# Patient Record
Sex: Male | Born: 1995 | Race: Black or African American | Hispanic: No | Marital: Single | Smoking: Never smoker
Health system: Southern US, Community
[De-identification: ages and names within clinical notes are randomized; demographics above are authoritative.]

## PROBLEM LIST (undated history)

## (undated) DIAGNOSIS — Z9641 Presence of insulin pump (external) (internal): Secondary | ICD-10-CM

## (undated) DIAGNOSIS — E1110 Type 2 diabetes mellitus with ketoacidosis without coma: Secondary | ICD-10-CM

## (undated) DIAGNOSIS — E1010 Type 1 diabetes mellitus with ketoacidosis without coma: Secondary | ICD-10-CM

## (undated) DIAGNOSIS — E119 Type 2 diabetes mellitus without complications: Secondary | ICD-10-CM

---
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
## 2017-05-29 ENCOUNTER — Encounter (HOSPITAL_COMMUNITY): Payer: Self-pay | Admitting: Emergency Medicine

## 2017-05-29 ENCOUNTER — Observation Stay (HOSPITAL_COMMUNITY)
Admission: EM | Admit: 2017-05-29 | Discharge: 2017-05-30 | Disposition: A | Discharge status: Home / Self Care | Payer: BC Managed Care – PPO | Attending: Internal Medicine | Admitting: Internal Medicine

## 2017-05-29 ENCOUNTER — Other Ambulatory Visit: Payer: Self-pay

## 2017-05-29 DIAGNOSIS — E860 Dehydration: Secondary | ICD-10-CM

## 2017-05-29 DIAGNOSIS — Z794 Long term (current) use of insulin: Secondary | ICD-10-CM | POA: Insufficient documentation

## 2017-05-29 DIAGNOSIS — E1010 Type 1 diabetes mellitus with ketoacidosis without coma: Secondary | ICD-10-CM

## 2017-05-29 DIAGNOSIS — R112 Nausea with vomiting, unspecified: Secondary | ICD-10-CM | POA: Diagnosis present

## 2017-05-29 DIAGNOSIS — E1110 Type 2 diabetes mellitus with ketoacidosis without coma: Secondary | ICD-10-CM | POA: Diagnosis present

## 2017-05-29 DIAGNOSIS — Z9641 Presence of insulin pump (external) (internal): Secondary | ICD-10-CM | POA: Insufficient documentation

## 2017-05-29 DIAGNOSIS — D72829 Elevated white blood cell count, unspecified: Secondary | ICD-10-CM | POA: Diagnosis not present

## 2017-05-29 DIAGNOSIS — N179 Acute kidney failure, unspecified: Secondary | ICD-10-CM | POA: Insufficient documentation

## 2017-05-29 LAB — BASIC METABOLIC PANEL
ANION GAP: 10 (ref 5–15)
BUN: 21 mg/dL — AB (ref 6–20)
CO2: 19 mmol/L — AB (ref 22–32)
Calcium: 8.9 mg/dL (ref 8.9–10.3)
Chloride: 110 mmol/L (ref 101–111)
Creatinine, Ser: 0.9 mg/dL (ref 0.61–1.24)
GFR calc Af Amer: >60 mL/min (ref >60)
GFR calc non Af Amer: >60 mL/min (ref >60)
GLUCOSE: 252 mg/dL — AB (ref 65–99)
POTASSIUM: 4.2 mmol/L (ref 3.5–5.1)
Sodium: 139 mmol/L (ref 135–145)

## 2017-05-29 LAB — COMPREHENSIVE METABOLIC PANEL
ALK PHOS: 107 U/L (ref 38–126)
ALT: 36 U/L (ref 17–63)
ANION GAP: 18 — AB (ref 5–15)
AST: 40 U/L (ref 15–41)
Albumin: 4.7 g/dL (ref 3.5–5.0)
BILIRUBIN TOTAL: 1.5 mg/dL — AB (ref 0.3–1.2)
BUN: 27 mg/dL — ABNORMAL HIGH (ref 6–20)
CALCIUM: 10.3 mg/dL (ref 8.9–10.3)
CO2: 18 mmol/L — ABNORMAL LOW (ref 22–32)
Chloride: 101 mmol/L (ref 101–111)
Creatinine, Ser: 1.33 mg/dL — ABNORMAL HIGH (ref 0.61–1.24)
GFR calc Af Amer: >60 mL/min (ref >60)
Glucose, Bld: 393 mg/dL — ABNORMAL HIGH (ref 65–99)
POTASSIUM: 4.8 mmol/L (ref 3.5–5.1)
Sodium: 137 mmol/L (ref 135–145)
TOTAL PROTEIN: 9.3 g/dL — AB (ref 6.5–8.1)

## 2017-05-29 LAB — CBC
HEMATOCRIT: 46.9 % (ref 39.0–52.0)
HEMATOCRIT: 39.3 % (ref 39.0–52.0)
HEMOGLOBIN: 14.2 g/dL (ref 13.0–17.0)
Hemoglobin: 16.8 g/dL (ref 13.0–17.0)
MCH: 30.5 pg (ref 26.0–34.0)
MCH: 30.5 pg (ref 26.0–34.0)
MCHC: 35.8 g/dL (ref 30.0–36.0)
MCHC: 36.1 g/dL — AB (ref 30.0–36.0)
MCV: 85.1 fL (ref 78.0–100.0)
MCV: 84.3 fL (ref 78.0–100.0)
PLATELETS: 359 10*3/uL (ref 150–400)
Platelets: 313 10*3/uL (ref 150–400)
RBC: 5.51 MIL/uL (ref 4.22–5.81)
RBC: 4.66 MIL/uL (ref 4.22–5.81)
RDW: 13.1 % (ref 11.5–15.5)
RDW: 12.9 % (ref 11.5–15.5)
WBC: 26.4 10*3/uL — AB (ref 4.0–10.5)
WBC: 25.2 10*3/uL — ABNORMAL HIGH (ref 4.0–10.5)

## 2017-05-29 LAB — URINALYSIS, ROUTINE W REFLEX MICROSCOPIC
BACTERIA UA: NONE SEEN
BILIRUBIN URINE: NEGATIVE
HGB URINE DIPSTICK: NEGATIVE
Ketones, ur: 80 mg/dL — AB
LEUKOCYTES UA: NEGATIVE
NITRITE: NEGATIVE
PH: 6 (ref 5.0–8.0)
Protein, ur: NEGATIVE mg/dL
SPECIFIC GRAVITY, URINE: 1.033 — AB (ref 1.005–1.030)
Squamous Epithelial / LPF: NONE SEEN

## 2017-05-29 LAB — CBG MONITORING, ED
Glucose-Capillary: 394 mg/dL — ABNORMAL HIGH (ref 65–99)
Glucose-Capillary: 245 mg/dL — ABNORMAL HIGH (ref 65–99)
Glucose-Capillary: 224 mg/dL — ABNORMAL HIGH (ref 65–99)

## 2017-05-29 LAB — GLUCOSE, CAPILLARY
GLUCOSE-CAPILLARY: 188 mg/dL — AB (ref 65–99)
Glucose-Capillary: 225 mg/dL — ABNORMAL HIGH (ref 65–99)

## 2017-05-29 LAB — LIPASE, BLOOD: Lipase: 31 U/L (ref 11–51)

## 2017-05-29 MED ORDER — SODIUM CHLORIDE 0.9 % IV BOLUS (SEPSIS)
1000.0000 mL | Freq: Once | INTRAVENOUS | Status: AC
  Administered 2017-05-29: 1000 mL via INTRAVENOUS

## 2017-05-29 MED ORDER — SODIUM CHLORIDE 0.9 % IV SOLN
INTRAVENOUS | Status: DC
  Administered 2017-05-29: 12:00:00 via INTRAVENOUS

## 2017-05-29 MED ORDER — POTASSIUM CHLORIDE 10 MEQ/100ML IV SOLN
10.0000 meq | INTRAVENOUS | Status: AC
  Administered 2017-05-29 (×2): 10 meq via INTRAVENOUS
  Filled 2017-05-29 (×2): qty 100

## 2017-05-29 MED ORDER — SODIUM CHLORIDE 0.9 % IV SOLN: INTRAVENOUS | Status: DC

## 2017-05-29 MED ORDER — KETOROLAC TROMETHAMINE 15 MG/ML IJ SOLN
15.0000 mg | Freq: Four times a day (QID) | INTRAMUSCULAR | Status: DC | PRN
  Administered 2017-05-29: 15 mg via INTRAVENOUS
  Filled 2017-05-29: qty 1

## 2017-05-29 MED ORDER — INSULIN PUMP
Freq: Three times a day (TID) | SUBCUTANEOUS | Status: DC
  Administered 2017-05-29 – 2017-05-30 (×4): via SUBCUTANEOUS
  Filled 2017-05-29: qty 1

## 2017-05-29 MED ORDER — SODIUM CHLORIDE 0.9 % IV SOLN: 1000.0000 mL | INTRAVENOUS | Status: DC

## 2017-05-29 MED ORDER — SODIUM CHLORIDE 0.9 % IV SOLN
INTRAVENOUS | Status: DC
  Administered 2017-05-29 – 2017-05-30 (×2): via INTRAVENOUS

## 2017-05-29 MED ORDER — ENOXAPARIN SODIUM 40 MG/0.4ML ~~LOC~~ SOLN
40.0000 mg | SUBCUTANEOUS | Status: DC
  Administered 2017-05-29: 40 mg via SUBCUTANEOUS
  Filled 2017-05-29 (×2): qty 0.4

## 2017-05-29 MED ORDER — DEXTROSE-NACL 5-0.45 % IV SOLN: INTRAVENOUS | Status: DC

## 2017-05-29 MED ORDER — SODIUM CHLORIDE 0.9 % IV SOLN
INTRAVENOUS | Status: DC
  Filled 2017-05-29: qty 1

## 2017-05-29 MED ORDER — ONDANSETRON HCL 4 MG/2ML IJ SOLN
4.0000 mg | Freq: Once | INTRAMUSCULAR | Status: AC | PRN
  Administered 2017-05-29: 4 mg via INTRAVENOUS
  Filled 2017-05-29: qty 2

## 2017-05-29 MED ORDER — ONDANSETRON HCL 4 MG/2ML IJ SOLN
4.0000 mg | INTRAMUSCULAR | Status: DC | PRN
  Administered 2017-05-29 (×2): 4 mg via INTRAVENOUS
  Filled 2017-05-29 (×2): qty 2

## 2017-05-29 MED ORDER — INSULIN PUMP
Freq: Three times a day (TID) | SUBCUTANEOUS | Status: DC
  Filled 2017-05-29: qty 1

## 2017-05-29 MED ORDER — INFLUENZA VAC SPLIT QUAD 0.5 ML IM SUSY: 0.5000 mL | PREFILLED_SYRINGE | INTRAMUSCULAR | Status: DC

## 2017-05-29 NOTE — Plan of Care (Signed)
  Safety: Ability to remain free from injury will improve 05/29/2017 2043 - Progressing by Antionette CharPeng, Aura Bibby P, RN   Skin Integrity: Risk for impaired skin integrity will decrease 05/29/2017 2043 - Progressing by Antionette CharPeng, Elvis Boot P, RN

## 2017-05-29 NOTE — ED Notes (Signed)
ED TO INPATIENT HANDOFF REPORT  Name/Age/Gender Mason Mclaughlin 21 y.o. male  Code Status    Code Status Orders  (From admission, onward)        Start     Ordered   05/29/17 1435  Full code  Continuous     05/29/17 1434    Code Status History    Date Active Date Inactive Code Status Order ID Comments User Context   This patient has a current code status but no historical code status.      Home/SNF/Other Home Home  Chief Complaint nausea, SHoB, intoxicated  Level of Care/Admitting Diagnosis ED Disposition    ED Disposition Condition Comment   Admit  Hospital Area: [facility] [[id number]]  Level of Care: Med-Surg [16]  Diagnosis: DKA (diabetic ketoacidoses) (HCC) [193956]  Admitting Physician: MYERS, ISKRA M [[id number]]  Attending Physician: MYERS, ISKRA M [[id number]]  PT Class (Do Not Modify): Observation [104]  PT Acc Code (Do Not Modify): Observation [10022]       Medical History Past Medical History:  Diagnosis Date  . Diabetes mellitus without complication (HCC)     Allergies No Known Allergies  IV Location/Drains/Wounds Patient Lines/Drains/Airways Status   Active Line/Drains/Airways    Name:   Placement date:   Placement time:   Site:   Days:   Peripheral IV 05/29/17 Left;Anterior Forearm   05/29/17    0829    Forearm   less than 1          Labs/Imaging Results for orders placed or performed during the hospital encounter of 05/29/17 (from the past 48 hour(s))  CBG monitoring, ED     Status: Abnormal   Collection Time: 05/29/17  7:45 AM  Result Value Ref Range   Glucose-Capillary 394 (H) 65 - 99 mg/dL  Lipase, blood     Status: None   Collection Time: 05/29/17  8:24 AM  Result Value Ref Range   Lipase 31 11 - 51 U/L  Comprehensive metabolic panel     Status: Abnormal   Collection Time: 05/29/17  8:24 AM  Result Value Ref Range   Sodium 137 135 - 145 mmol/L   Potassium 4.8 3.5 - 5.1 mmol/L   Chloride 101 101 - 111 mmol/L   CO2  18 (L) 22 - 32 mmol/L   Glucose, Bld 393 (H) 65 - 99 mg/dL   BUN 27 (H) 6 - 20 mg/dL   Creatinine, Ser 1.33 (H) 0.61 - 1.24 mg/dL   Calcium 10.3 8.9 - 10.3 mg/dL   Total Protein 9.3 (H) 6.5 - 8.1 g/dL   Albumin 4.7 3.5 - 5.0 g/dL   AST 40 15 - 41 U/L   ALT 36 17 - 63 U/L   Alkaline Phosphatase 107 38 - 126 U/L   Total Bilirubin 1.5 (H) 0.3 - 1.2 mg/dL   GFR calc non Af Amer >60 >60 mL/min   GFR calc Af Amer >60 >60 mL/min    Comment: (NOTE) The eGFR has been calculated using the CKD EPI equation. This calculation has not been validated in all clinical situations. eGFR's persistently <60 mL/min signify possible Chronic Kidney Disease.    Anion gap 18 (H) 5 - 15  CBC     Status: Abnormal   Collection Time: 05/29/17  8:24 AM  Result Value Ref Range   WBC 26.4 (H) 4.0 - 10.5 K/uL   RBC 5.51 4.22 - 5.81 MIL/uL   Hemoglobin 16.8 13.0 - 17.0 g/dL   HCT 46.9 39.0 -   52.0 %   MCV 85.1 78.0 - 100.0 fL   MCH 30.5 26.0 - 34.0 pg   MCHC 35.8 30.0 - 36.0 g/dL   RDW 13.1 11.5 - 15.5 %   Platelets 359 150 - 400 K/uL  Blood gas, venous     Status: Abnormal (Preliminary result)   Collection Time: 05/29/17  8:55 AM  Result Value Ref Range   pH, Ven 7.412 7.250 - 7.430   pCO2, Ven 28.5 (L) 44.0 - 60.0 mmHg   pO2, Ven PENDING 32.0 - 45.0 mmHg   Bicarbonate 17.8 (L) 20.0 - 28.0 mmol/L   Acid-base deficit 4.8 (H) 0.0 - 2.0 mmol/L   O2 Saturation 60.6 %   Patient temperature 98.6    Collection site VEIN    Drawn by COLLECTED BY LABORATORY    Sample type VENOUS   CBG monitoring, ED     Status: Abnormal   Collection Time: 05/29/17 11:28 AM  Result Value Ref Range   Glucose-Capillary 245 (H) 65 - 99 mg/dL  Basic metabolic panel     Status: Abnormal   Collection Time: 05/29/17 11:54 AM  Result Value Ref Range   Sodium 139 135 - 145 mmol/L   Potassium 4.2 3.5 - 5.1 mmol/L   Chloride 110 101 - 111 mmol/L   CO2 19 (L) 22 - 32 mmol/L   Glucose, Bld 252 (H) 65 - 99 mg/dL   BUN 21 (H) 6 - 20  mg/dL   Creatinine, Ser 0.90 0.61 - 1.24 mg/dL   Calcium 8.9 8.9 - 10.3 mg/dL   GFR calc non Af Amer >60 >60 mL/min   GFR calc Af Amer >60 >60 mL/min    Comment: (NOTE) The eGFR has been calculated using the CKD EPI equation. This calculation has not been validated in all clinical situations. eGFR's persistently <60 mL/min signify possible Chronic Kidney Disease.    Anion gap 10 5 - 15  CBC     Status: Abnormal   Collection Time: 05/29/17 11:54 AM  Result Value Ref Range   WBC 25.2 (H) 4.0 - 10.5 K/uL   RBC 4.66 4.22 - 5.81 MIL/uL   Hemoglobin 14.2 13.0 - 17.0 g/dL    Comment: RESULT REPEATED AND VERIFIED Results Called to: DOWD,P AT 1225 ON [id number] BY HOOKER,B    HCT 39.3 39.0 - 52.0 %   MCV 84.3 78.0 - 100.0 fL   MCH 30.5 26.0 - 34.0 pg   MCHC 36.1 (H) 30.0 - 36.0 g/dL   RDW 12.9 11.5 - 15.5 %   Platelets 313 150 - 400 K/uL  Urinalysis, Routine w reflex microscopic     Status: Abnormal   Collection Time: 05/29/17  1:30 PM  Result Value Ref Range   Color, Urine YELLOW YELLOW   APPearance CLEAR CLEAR   Specific Gravity, Urine 1.033 (H) 1.005 - 1.030   pH 6.0 5.0 - 8.0   Glucose, UA >=500 (A) NEGATIVE mg/dL   Hgb urine dipstick NEGATIVE NEGATIVE   Bilirubin Urine NEGATIVE NEGATIVE   Ketones, ur 80 (A) NEGATIVE mg/dL   Protein, ur NEGATIVE NEGATIVE mg/dL   Nitrite NEGATIVE NEGATIVE   Leukocytes, UA NEGATIVE NEGATIVE   RBC / HPF 0-5 0 - 5 RBC/hpf   WBC, UA 0-5 0 - 5 WBC/hpf   Bacteria, UA NONE SEEN NONE SEEN   Squamous Epithelial / LPF NONE SEEN NONE SEEN  CBG monitoring, ED     Status: Abnormal   Collection Time: 05/29/17  1:30 PM  Result  Value Ref Range   Glucose-Capillary 224 (H) 65 - 99 mg/dL   No results found.  Pending Labs Unresulted Labs (From admission, onward)   Start     Ordered   06/05/17 0500  Creatinine, serum  (enoxaparin (LOVENOX)    CrCl >/= 30 ml/min)  Weekly,   R    Comments:  while on enoxaparin therapy    05/29/17 1128   05/30/17 0500  CBC   Tomorrow morning,   R     05/29/17 1126   05/30/17 8469  Basic metabolic panel  Tomorrow morning,   R     05/29/17 1126   05/30/17 0500  Hemoglobin A1c  Tomorrow morning,   R     05/29/17 1127   05/29/17 1128  HIV antibody (Routine Testing)  Once,   R     05/29/17 1128      Vitals/Pain Today's Vitals   05/29/17 0737 05/29/17 0738 05/29/17 1615 05/29/17 1629  BP: 132/90   137/76  Pulse: (!) 135  (!) 102 90  Resp: _0 Temp: 97.6 F (36.4 C)     TempSrc: Oral     SpO2: 100%  98% 100%  Weight:  132 lb (59.9 kg)    Height:  5' 5" (1.651 m)    PainSc: 4    0-No pain    Isolation Precautions No active isolations  Medications Medications  enoxaparin (LOVENOX) injection 40 mg (not administered)  ondansetron (ZOFRAN) injection 4 mg (4 mg Intravenous Given 05/29/17 1319)  ketorolac (TORADOL) 15 MG/ML injection 15 mg (not administered)  insulin pump (not administered)  0.9 %  sodium chloride infusion ( Intravenous Restarted 05/29/17 1449)  ondansetron (ZOFRAN) injection 4 mg (4 mg Intravenous Given 05/29/17 0829)  sodium chloride 0.9 % bolus 1,000 mL (0 mLs Intravenous Stopped 05/29/17 0950)  sodium chloride 0.9 % bolus 1,000 mL (0 mLs Intravenous Stopped 05/29/17 1123)  potassium chloride 10 mEq in 100 mL IVPB (0 mEq Intravenous Stopped 05/29/17 1419)    Mobility walks

## 2017-05-29 NOTE — ED Notes (Signed)
Bed: WA23 Expected date:  Expected time:  Means of arrival:  Comments: 

## 2017-05-29 NOTE — ED Notes (Signed)
Pt denies any nausea right now.

## 2017-05-29 NOTE — ED Triage Notes (Signed)
Pt c/o vomiting x 2 days, pt states he is a diabetic, but hasn't checked blood sugars. Pt states he has had labored breathing.

## 2017-05-29 NOTE — H&P (Signed)
History and Physical    Finneas Mathe OJJ:[id number] DOB: 16-Dec-1995 DOA: 05/29/2017  Referring MD/NP/PA: Dr. Dalia Heading  PCP: Patient, No Pcp Per   Patient coming from: home  Chief Complaint:   HPI: Mason Mclaughlin is a 22 y.o. male with known DM on insulin pump, says has not been hospitalized for DKA since he was first diagnosed. He reports that about 2 days prior to this admission he started to feel sick with nausea, multiple episodes of nonbloody vomiting and epigastric abdominal pain. Patient explains he thinks he has vomited over 30 times and he was unable to keep any liquids or solids down at all. He reports that abdominal pain as throbbing and constant, 10/10 in severity, and nonradiating, worse with any oral intake and even minimal movement. Patient reports associated fatigue and malaise. He denies known sick contacts or exposures but feels like he has had some mild cold symptoms last week. Patient denies chest pain and shortness of breath, no urinary concerns, no known fevers or chills.  ED Course: In emergency department, patient hemodynamically stable, vital signs stable except tachycardia up to 135, blood work notable for WBC 26, creatinine 1.33, bicarbonate 18, anion gap 18. TRH asked to admit for further evaluation.   Review of Systems:  Constitutional: Negative for fever, chills, diaphoresis  HENT: Negative for ear pain, nosebleeds, neck stiffness and ear discharge.   Eyes: Negative for pain, discharge, redness, itching and visual disturbance.  Respiratory: Negative for cough, choking, chest tightness, shortness of breath, wheezing and stridor.   Cardiovascular: Negative for chest pain, palpitations and leg swelling.  Gastrointestinal: Negative for abdominal distention.  Genitourinary: Negative for dysuria, urgency, frequency, hematuria, flank pain, decreased urine volume, difficulty urinating and dyspareunia.  Musculoskeletal: Negative for back pain, joint swelling,  arthralgias and gait problem.  Neurological: Negative for dizziness, tremors, seizures, syncope, facial asymmetry, speech difficulty Hematological: Negative for adenopathy. Does not bruise/bleed easily.  Psychiatric/Behavioral: Negative for hallucinations, behavioral problems, confusion, dysphoric mood, decreased concentration and agitation.   Past Medical History:  Diagnosis Date  . Diabetes mellitus without complication ([place])    Social history:   reports that  has never smoked. he has never used smokeless tobacco. He reports that he drinks alcohol. He reports that he uses drugs. Drug: Marijuana. Frequency: 4.00 times per week.  No Known Allergies  No family history of diabetes or hypertension.  Prior to Admission medications   Medication Sig Start Date End Date Taking? Authorizing Provider  Insulin Human (INSULIN PUMP) SOLN INSULIN PUMP   Yes [provider]  NOVOLOG 100 UNIT/ML injection 10 UNITS PER EVERY GRAM OF CARBOHYDRATES 05/03/17  Yes [provider]   Physical Exam: Vitals:   05/29/17 0737 05/29/17 0738  BP: 132/90   Pulse: (!) 135   Resp: 20   Temp: 97.6 F (36.4 C)   TempSrc: Oral   SpO2: 100%   Weight:  59.9 kg (132 lb)  Height:  '5\' 5"'  (1.651 m)    Constitutional: NAD, calm, comfortable Vitals:   05/29/17 0737 05/29/17 0738  BP: 132/90   Pulse: (!) 135   Resp: 20   Temp: 97.6 F (36.4 C)   TempSrc: Oral   SpO2: 100%   Weight:  59.9 kg (132 lb)  Height:  '5\' 5"'  (1.651 m)   Eyes: PERRL, lids and conjunctivae normal ENMT: Mucous membranes are dry. Posterior pharynx clear of any exudate or lesions.Normal dentition.  Neck: normal, supple, no masses, no thyromegaly Respiratory: clear to auscultation bilaterally,  no wheezing, no crackles. Normal respiratory effort. No accessory muscle use.  Cardiovascular: tachycardic, no murmurs noted Abdomen: mild tenderness in the epigastric area, no guarding or rebound tenderness, bowel sounds  present. Musculoskeletal: no clubbing / cyanosis. No joint deformity upper and lower extremities. Good ROM, no contractures. Normal muscle tone.  Skin: no rashes, lesions, ulcers. No induration Neurologic: CN 2-12 grossly intact. Sensation intact, DTR normal. Strength 5/5 in all 4.  Psychiatric: Normal judgment and insight. Alert and oriented x 3. Normal mood.   Labs on Admission: I have personally reviewed following labs and imaging studies  CBC: Recent Labs  Lab 05/29/17 0824  WBC 26.4*  HGB 16.8  HCT 46.9  MCV 85.1  PLT 917   Basic Metabolic Panel: Recent Labs  Lab 05/29/17 0824  NA 137  K 4.8  CL 101  CO2 18*  GLUCOSE 393*  BUN 27*  CREATININE 1.33*  CALCIUM 10.3   Liver Function Tests: Recent Labs  Lab 05/29/17 0824  AST 40  ALT 36  ALKPHOS 107  BILITOT 1.5*  PROT 9.3*  ALBUMIN 4.7   Recent Labs  Lab 05/29/17 0824  LIPASE 31   CBG: Recent Labs  Lab 05/29/17 0745  GLUCAP 394*   Radiological Exams on Admission: No results found.  EKG: pending  Assessment/Plan Active Problems: DKA - Unclear etiology of what provoked this, suspect viral component, gastroenteritis - Admit for further evaluation - Due to requirement of insulin drip, we'll need to place in step down unit - Keep nothing by mouth for now and if no further vomiting, allow full liquid diet - Follow protocol for DKA - Keep on IV fluids - BMP every 2 hours until anion gap closes - check A1c  Acute kidney injury - Suspect prerenal in the setting of the above problem - Place on IV fluids as noted above - BMP in the morning  Leukocytosis, SIRS criteria met  - Suspect reactive process from DKA - No clear evidence of an infectious etiology even though viral gastroenteritis possible - Repeat CBC in the morning  Nausea and vomiting, abdominal pain - Suspect viral etiology but this is not clear at this time - Supportive care with IV fluids, analgesia and antiemetics as needed -  Slowly advance diet as clinically indicated   DVT prophylaxis: Lovenox SQ Code Status: Full  Family Communication: Pt and family updated at bedside Disposition Plan: will go home once medically stable  Consults called: none Admission status: inpatient   Faye Ramsay MD [facility] Pager [phone]  If 7PM-7AM, please contact night-coverage [email or web address] Password [email or web address]  05/29/2017, 10:02 AM

## 2017-05-29 NOTE — Progress Notes (Addendum)
BMP received, anion gap closed. I have reevaluated patient. Significant improvement from earlier. It is reasonable to keep patient on home regimen insulin pump. Change bed requested from stepdown unit to medical unit. If patient continues doing well, suspect he will be stable for discharge in the morning. This was discussed with charge RN in the emergency department.  Debbora PrestoMAGICK-Teairra Millar, MD  [facility] Pager [phone]  If 7PM-7AM, please contact night-coverage [email or web address] Password [email or web address]

## 2017-05-29 NOTE — ED Provider Notes (Signed)
[facility]-EMERGENCY DEPT Provider Note   CSN: [id number] Arrival date & time: 05/29/17  4098     History   Chief Complaint Chief Complaint  Patient presents with  . Abdominal Pain  . Emesis    HPI Mason Mclaughlin is a 22 y.o. male.  HPI Patient is a type I diabetic with an insulin pump.  He reports he has not been hospitalized for DKA since he was first diagnosed.  In the interim, he reports one hospitalization for dehydration.  He reports he started getting sick 2 days ago.  He reports is been vomiting multiple times, he estimates up to 30.  No diarrhea.  Diffuse cramping abdominal discomfort.  No documented fever.  Ports he feels fatigued weak and achy.  He denies any rashes areas of redness or skin wounds.  He reports he has had some very mild cold symptoms but he feels that they are improving.  Insulin pump appears to be delivering an average basal rate of approximately 1.3 units/h. Past Medical History:  Diagnosis Date  . Diabetes mellitus without complication (HCC)     There are no active problems to display for this patient.        Home Medications    Prior to Admission medications   Medication Sig Start Date End Date Taking? Authorizing Provider  acetaminophen (TYLENOL) 500 MG tablet Take 1,500 mg by mouth daily as needed.   Yes [provider]  Chlorphen-Pseudoephed-APAP (THERAFLU FLU/COLD PO) Take 2 capsules by mouth at bedtime as needed (COUGH, COLD, FLU).   Yes [provider]  GuaiFENesin (COUGH SYRUP PO) Take 30 mLs by mouth 2 (two) times daily as needed (FLU, COUGH).   Yes [provider]  Insulin Human (INSULIN PUMP) SOLN INSULIN PUMP   Yes [provider]  NOVOLOG 100 UNIT/ML injection 10 UNITS PER EVERY GRAM OF CARBOHYDRATES 05/03/17  Yes [provider]    Family History No family history on file.  Social History Social History   Tobacco Use  . Smoking status: Never Smoker  .  Smokeless tobacco: Never Used  Substance Use Topics  . Alcohol use: Yes  . Drug use: Yes    Frequency: 4.0 times per week    Types: Marijuana     Allergies   Patient has no known allergies.   Review of Systems Review of Systems 10 Systems reviewed and are negative for acute change except as noted in the HPI.   Physical Exam Updated Vital Signs BP 132/90 (BP Location: Left Arm)   Pulse (!) 135   Temp 97.6 F (36.4 C) (Oral)   Resp 20   Ht 5\' 5"  (1.651 m)   Wt 59.9 kg (132 lb)   SpO2 100%   BMI 21.97 kg/m   Physical Exam  Constitutional: He is oriented to person, place, and time.  Patient is appropriate and alert.  He is ill in appearance.  Smells of ketones.  Peers uncomfortable.  HENT:  Head: Normocephalic and atraumatic.  Mucous membranes slightly dry.  Posterior oropharynx widely patent.  No erythema or exudate.  Eyes: EOM are normal. Pupils are equal, round, and reactive to light.  Neck: Neck supple.  Cardiovascular:  Tachycardia.  No rub murmur gallop.  Pulmonary/Chest: Effort normal and breath sounds normal.  Abdominal: Soft.  Patient abdomen is soft.  Moderate diffuse tenderness to palpation without guarding.  Musculoskeletal: Normal range of motion. He exhibits no edema, tenderness or deformity.  Neurological: He is alert and oriented  to person, place, and time. No cranial nerve deficit. He exhibits normal muscle tone. Coordination normal.  Skin: Skin is warm and dry.  Psychiatric: He has a normal mood and affect.     ED Treatments / Results  Labs (all labs ordered are listed, but only abnormal results are displayed) Labs Reviewed  COMPREHENSIVE METABOLIC PANEL - Abnormal; Notable for the following components:      Result Value   CO2 18 (*)    Glucose, Bld 393 (*)    BUN 27 (*)    Creatinine, Ser 1.33 (*)    Total Protein 9.3 (*)    Total Bilirubin 1.5 (*)    Anion gap 18 (*)    All other components within normal limits  CBC - Abnormal; Notable  for the following components:   WBC 26.4 (*)    All other components within normal limits  BLOOD GAS, VENOUS - Abnormal; Notable for the following components:   pCO2, Ven 28.5 (*)    Bicarbonate 17.8 (*)    Acid-base deficit 4.8 (*)    All other components within normal limits  CBG MONITORING, ED - Abnormal; Notable for the following components:   Glucose-Capillary 394 (*)    All other components within normal limits  LIPASE, BLOOD  URINALYSIS, ROUTINE W REFLEX MICROSCOPIC    EKG  EKG Interpretation None       Radiology No results found.  Procedures Procedures (including critical care time) CRITICAL CARE Performed by: Cristy FriedlanderMarchy Sheera Illingworth   Total critical care time: 20 minutes  Critical care time was exclusive of separately billable procedures and treating other patients.  Critical care was necessary to treat or prevent imminent or life-threatening deterioration.  Critical care was time spent personally by me on the following activities: development of treatment plan with patient and/or surrogate as well as nursing, discussions with consultants, evaluation of patient's response to treatment, examination of patient, obtaining history from patient or surrogate, ordering and performing treatments and interventions, ordering and review of laboratory studies, ordering and review of radiographic studies, pulse oximetry and re-evaluation of patient's condition. Medications Ordered in ED Medications  sodium chloride 0.9 % bolus 1,000 mL (0 mLs Intravenous Stopped 05/29/17 0950)    Followed by  0.9 %  sodium chloride infusion (not administered)  sodium chloride 0.9 % bolus 1,000 mL (1,000 mLs Intravenous New Bag/Given 05/29/17 1000)  ondansetron (ZOFRAN) injection 4 mg (4 mg Intravenous Given 05/29/17 0829)     Initial Impression / Assessment and Plan / ED Course  I have reviewed the triage vital signs and the nursing notes.  Pertinent labs & imaging results that were available during  my care of the patient were reviewed by me and considered in my medical decision making (see chart for details).     Consult: [facility] for admission.  Final Clinical Impressions(s) / ED Diagnoses   Final diagnoses:  Type 1 diabetes mellitus with ketoacidosis without coma (HCC)  Dehydration  Intractable vomiting with nausea, unspecified vomiting type   Patient is compliant with an insulin pump.  He has had intractable vomiting for 2 days.  Patient is significantly dehydrated.  He does have an anion gap and acidosis but not significant pH shift.  Mental status remains clear.  Patient will require rehydration and treatment for intractable nausea and vomiting and type I diabetes.  Currently insulin pump is delivering average 1.3 units/h. ED Discharge Orders    None       Arby BarrettePfeiffer, Aiya Keach, MD 05/29/17 1126

## 2017-05-30 DIAGNOSIS — E1310 Other specified diabetes mellitus with ketoacidosis without coma: Secondary | ICD-10-CM

## 2017-05-30 DIAGNOSIS — R112 Nausea with vomiting, unspecified: Secondary | ICD-10-CM

## 2017-05-30 DIAGNOSIS — E860 Dehydration: Secondary | ICD-10-CM | POA: Diagnosis not present

## 2017-05-30 LAB — GLUCOSE, CAPILLARY
GLUCOSE-CAPILLARY: 193 mg/dL — AB (ref 65–99)
Glucose-Capillary: 145 mg/dL — ABNORMAL HIGH (ref 65–99)
Glucose-Capillary: 103 mg/dL — ABNORMAL HIGH (ref 65–99)

## 2017-05-30 LAB — CBC
HCT: 35.5 % — ABNORMAL LOW (ref 39.0–52.0)
Hemoglobin: 12.4 g/dL — ABNORMAL LOW (ref 13.0–17.0)
MCH: 29.7 pg (ref 26.0–34.0)
MCHC: 34.9 g/dL (ref 30.0–36.0)
MCV: 84.9 fL (ref 78.0–100.0)
PLATELETS: 245 10*3/uL (ref 150–400)
RBC: 4.18 MIL/uL — ABNORMAL LOW (ref 4.22–5.81)
RDW: 13 % (ref 11.5–15.5)
WBC: 18.1 10*3/uL — ABNORMAL HIGH (ref 4.0–10.5)

## 2017-05-30 LAB — BASIC METABOLIC PANEL
Anion gap: 6 (ref 5–15)
BUN: 10 mg/dL (ref 6–20)
CO2: 23 mmol/L (ref 22–32)
CREATININE: 0.78 mg/dL (ref 0.61–1.24)
Calcium: 8.2 mg/dL — ABNORMAL LOW (ref 8.9–10.3)
Chloride: 107 mmol/L (ref 101–111)
GFR calc Af Amer: >60 mL/min (ref >60)
GLUCOSE: 110 mg/dL — AB (ref 65–99)
Potassium: 3.1 mmol/L — ABNORMAL LOW (ref 3.5–5.1)
SODIUM: 136 mmol/L (ref 135–145)

## 2017-05-30 LAB — BLOOD GAS, VENOUS
ACID-BASE DEFICIT: 4.8 mmol/L — AB (ref 0.0–2.0)
BICARBONATE: 17.8 mmol/L — AB (ref 20.0–28.0)
O2 SAT: 60.6 %
Patient temperature: 98.6
pCO2, Ven: 28.5 mmHg — ABNORMAL LOW (ref 44.0–60.0)
pH, Ven: 7.412 (ref 7.250–7.430)

## 2017-05-30 LAB — HEMOGLOBIN A1C
HEMOGLOBIN A1C: 11 % — AB (ref 4.8–5.6)
Mean Plasma Glucose: 269 mg/dL

## 2017-05-30 LAB — HIV ANTIBODY (ROUTINE TESTING W REFLEX): HIV Screen 4th Generation wRfx: NONREACTIVE

## 2017-05-30 MED ORDER — POTASSIUM CHLORIDE CRYS ER 20 MEQ PO TBCR
40.0000 meq | EXTENDED_RELEASE_TABLET | Freq: Two times a day (BID) | ORAL | Status: DC
  Administered 2017-05-30: 40 meq via ORAL
  Filled 2017-05-30: qty 2

## 2017-05-30 NOTE — Care Management Note (Signed)
Case Management Note  Patient Details  Name: Lysbeth GalasJamario Stonesifer MRN: [id number] Date of Birth: 11/13/1995  Subjective/Objective:                  DKA WITH SEPSIS/improving has insulin pump and is functioning.  Action/Plan: Date:  May 30, 2017 Chart reviewed for concurrent status and case management needs.  Will continue to follow patient progress.  Discharge Planning: following for needs  Expected discharge date: June 02 2017 Marcelle SmilingRhonda Terrisa Curfman, BSN, [place]3, [place]M   [phone]   Expected Discharge Date:                  Expected Discharge Plan:  Home/Self Care  In-House Referral:     Discharge planning Services  CM Consult  Post Acute Care Choice:    Choice offered to:     DME Arranged:    DME Agency:     HH Arranged:    HH Agency:     Status of Service:  In process, will continue to follow  If discussed at Long Length of Stay Meetings, dates discussed:    Additional Comments:  Golda AcreDavis, Dontrey Snellgrove Lynn, RN 05/30/2017, 10:17 AM

## 2017-05-30 NOTE — Progress Notes (Signed)
Inpatient Diabetes Program Recommendations  AACE/ADA: New Consensus Statement on Inpatient Glycemic Control (2015)  Target Ranges:  Prepandial:   less than 140 mg/dL      Peak postprandial:   less than 180 mg/dL (1-2 hours)      Critically ill patients:  140 - 180 mg/dL   Lab Results  Component Value Date   GLUCAP 193 (H) 05/30/2017   HGBA1C 11.0 (H) 05/30/2017    Review of Glycemic Control Results for Mason Mclaughlin, [place] (MRN [id number]) as of 05/30/2017 12:11  Ref. Range 05/29/2017 21:52 05/30/2017 01:49 05/30/2017 06:36 05/30/2017 07:28 05/30/2017 11:14  Glucose-Capillary Latest Ref Range: 65 - 99 mg/dL 409188 (H) 811145 (H)  914103 (H) 193 (H)   Diabetes history: DM Type 1 Outpatient Diabetes medications: insulin pump Current orders for Inpatient glycemic control: Novolog- Insulin pump  Inpatient Diabetes Program Recommendations:    Spoke with patient regarding insulin pump, patient states, "I just wasn't checking my blood sugars and doing right." Patient verified that he has all the needed supplies at home: meter, strips, CGM and supplies, and insulin pump supplies. Discussed the importance of checking blood sugars, encouraged the use of the libre, and follow up with endocrinologist. [place] requested an endo closer while in college. Patient states he plans to follow up has no further questions.  Pump Settings:   Basal Rates   1.3 U/hr- 12A-0630   1.5 U/hr 0630-12p   1.3U/hr 12p-12a  Carb ratio 1 Units for every 10 grams of carbs  Correction/Sensitivity factor: 1 Units for 50mg /dL  Target goals [phone] mg/dL   Thanks, Lujean RaveLauren Lanya Bucks, MSN, RNC-OB Diabetes Coordinator [phone] (8a-5p)

## 2017-05-30 NOTE — Progress Notes (Signed)
Pts IVs removed with clean and dry dressings intact. Pt denies pain at the time of d/c. Pt understands discharge teaching and follow ups. Pt taken to the front entrance via wheelchair with nursing staff and family present,

## 2017-05-31 NOTE — Progress Notes (Signed)
This RN accessed pt chart for chart review to answer question for family member.

## 2017-06-02 NOTE — Discharge Summary (Signed)
Physician Discharge Summary  Mason Mclaughlin YHC:[id number] DOB: May 09, 1996 DOA: 05/29/2017  PCP: Patient, No Pcp Per  Admit date: 05/29/2017 Discharge date: 05/30/2017  Admitted From: Home.  Disposition:  HOme  Recommendations for Outpatient Follow-up:  1. Follow up with PCP in 1-2 weeks 2. Please obtain BMP/CBC in one week   Discharge Condition: stable.  CODE STATUS: full code.  Diet recommendation: Heart Healthy   Brief/Interim Summary: Mason Mclaughlin is a 22 y.o. male with known DM on insulin pump, says has not been hospitalized for DKA since he was first diagnosed. He reports that about 2 days prior to this admission he started to feel sick with nausea, multiple episodes of nonbloody vomiting and epigastric abdominal pain. Patient explains he thinks he has vomited over 30 times and he was unable to keep any liquids or solids down at all. He reports that abdominal pain as throbbing and constant, 10/10 in severity, and nonradiating, worse with any oral intake and even minimal movement. Patient reports associated fatigue and malaise. He denies known sick contacts or exposures but feels like he has had some mild cold symptoms last week. Patient denies chest pain and shortness of breath, no urinary concerns, no known fevers or chills.    Discharge Diagnoses:  Active Problems:   DKA (diabetic ketoacidoses) ([place])  DKA - Unclear etiology of what provoked this, suspect viral component, gastroenteritis Admitted to step down and stated on the   insulin drip, anion gap closed and gently hydrated. Resumed his insulin pump on discharge.   Acute kidney injury - Suspect prerenal in the setting of the above problem - Place on IV fluids as noted above Creatinine improved   Leukocytosis, SIRS criteria met  - Suspect reactive process from DKA - No clear evidence of an infectious etiology. - recommend checking cbc to follow up wbc count.   Nausea and vomiting, abdominal pain - Suspect viral  etiology but this is not clear at this time - Supportive care with IV fluids, analgesia and antiemetics as needed Advanced diet and his symptoms resolved.       Discharge Instructions  Discharge Instructions    Diet - low sodium heart healthy   Complete by:  As directed    Discharge instructions   Complete by:  As directed    RECOMMEND outpatient follow up with endocrinologist in one week.     Allergies as of 05/30/2017   No Known Allergies     Medication List    TAKE these medications   acetaminophen 500 MG tablet Commonly known as:  TYLENOL Take 1,500 mg by mouth daily as needed.   COUGH SYRUP PO Take 30 mLs by mouth 2 (two) times daily as needed (FLU, COUGH).   insulin pump Soln 10 UNITS OF NOVOLOG 100 UNITS/ML PER EVERY GRAM OF CARBOHYDRATES VIA INSULIN PUMP   THERAFLU FLU/COLD PO Take 2 capsules by mouth at bedtime as needed (COUGH, COLD, FLU).       No Known Allergies  Consultations:  none.   Procedures/Studies: No results found.    Subjective: No new complaints.   Discharge Exam: Vitals:   05/29/17 2359 05/30/17 0446  BP: (!) 121/55 (!) 135/56  Pulse: 74 81  Resp: 16 16  Temp: 98.6 F (37 C) (!) 97.4 F (36.3 C)  SpO2: 100% 100%   Vitals:   05/29/17 1629 05/29/17 2026 05/29/17 2359 05/30/17 0446  BP: 137/76 (!) 126/59 (!) 121/55 (!) 135/56  Pulse: 90 76 74 81  Resp: 15 16  16 16  Temp:  98.3 F (36.8 C) 98.6 F (37 C) (!) 97.4 F (36.3 C)  TempSrc:  Oral Oral Axillary  SpO2: 100% 100% 100% 100%  Weight:      Height:        General: Pt is alert, awake, not in acute distress Cardiovascular: RRR, S1/S2 +, no rubs, no gallops Respiratory: CTA bilaterally, no wheezing, no rhonchi Abdominal: Soft, NT, ND, bowel sounds + Extremities: no edema, no cyanosis    The results of significant diagnostics from this hospitalization (including imaging, microbiology, ancillary and laboratory) are listed below for reference.      Microbiology: No results found for this or any previous visit (from the past 240 hour(s)).   Labs: BNP (last 3 results) No results for input(s): BNP in the last 8760 hours. Basic Metabolic Panel: Recent Labs  Lab 05/29/17 0824 05/29/17 1154 05/30/17 0636  NA 137 139 136  K 4.8 4.2 3.1*  CL 101 110 107  CO2 18* 19* 23  GLUCOSE 393* 252* 110*  BUN 27* 21* 10  CREATININE 1.33* 0.90 0.78  CALCIUM 10.3 8.9 8.2*   Liver Function Tests: Recent Labs  Lab 05/29/17 0824  AST 40  ALT 36  ALKPHOS 107  BILITOT 1.5*  PROT 9.3*  ALBUMIN 4.7   Recent Labs  Lab 05/29/17 0824  LIPASE 31   No results for input(s): AMMONIA in the last 168 hours. CBC: Recent Labs  Lab 05/29/17 0824 05/29/17 1154 05/30/17 0636  WBC 26.4* 25.2* 18.1*  HGB 16.8 14.2 12.4*  HCT 46.9 39.3 35.5*  MCV 85.1 84.3 84.9  PLT 359 313 245   Cardiac Enzymes: No results for input(s): CKTOTAL, CKMB, CKMBINDEX, TROPONINI in the last 168 hours. BNP: Invalid input(s): POCBNP CBG: Recent Labs  Lab 05/29/17 1929 05/29/17 2152 05/30/17 0149 05/30/17 0728 05/30/17 1114  GLUCAP 225* 188* 145* 103* 193*   D-Dimer No results for input(s): DDIMER in the last 72 hours. Hgb A1c No results for input(s): HGBA1C in the last 72 hours. Lipid Profile No results for input(s): CHOL, HDL, LDLCALC, TRIG, CHOLHDL, LDLDIRECT in the last 72 hours. Thyroid function studies No results for input(s): TSH, T4TOTAL, T3FREE, THYROIDAB in the last 72 hours.  Invalid input(s): FREET3 Anemia work up No results for input(s): VITAMINB12, FOLATE, FERRITIN, TIBC, IRON, RETICCTPCT in the last 72 hours. Urinalysis    Component Value Date/Time   COLORURINE YELLOW 05/29/2017 1330   APPEARANCEUR CLEAR 05/29/2017 1330   LABSPEC 1.033 (H) 05/29/2017 1330   PHURINE 6.0 05/29/2017 1330   GLUCOSEU >=500 (A) 05/29/2017 1330   HGBUR NEGATIVE 05/29/2017 1330   BILIRUBINUR NEGATIVE 05/29/2017 1330   KETONESUR 80 (A) 05/29/2017 1330    PROTEINUR NEGATIVE 05/29/2017 1330   NITRITE NEGATIVE 05/29/2017 1330   LEUKOCYTESUR NEGATIVE 05/29/2017 1330   Sepsis Labs Invalid input(s): PROCALCITONIN,  WBC,  LACTICIDVEN Microbiology No results found for this or any previous visit (from the past 240 hour(s)).   Time coordinating discharge: Over 30 minutes  SIGNED:   Hosie Poisson, MD  [facility] 06/02/2017, 12:31 PM Pager   If 7PM-7AM, please contact night-coverage [email or web address] Password [email or web address]

## 2018-03-14 ENCOUNTER — Inpatient Hospital Stay (HOSPITAL_COMMUNITY)
Admission: EM | Admit: 2018-03-14 | Discharge: 2018-03-18 | DRG: 638 | Disposition: A | Discharge status: Home / Self Care | Payer: BC Managed Care – PPO | Attending: Internal Medicine | Admitting: Internal Medicine

## 2018-03-14 ENCOUNTER — Emergency Department (HOSPITAL_COMMUNITY): Payer: BC Managed Care – PPO

## 2018-03-14 ENCOUNTER — Encounter (HOSPITAL_COMMUNITY): Payer: Self-pay | Admitting: Emergency Medicine

## 2018-03-14 ENCOUNTER — Other Ambulatory Visit: Payer: Self-pay

## 2018-03-14 DIAGNOSIS — N179 Acute kidney failure, unspecified: Secondary | ICD-10-CM | POA: Diagnosis present

## 2018-03-14 DIAGNOSIS — E1010 Type 1 diabetes mellitus with ketoacidosis without coma: Secondary | ICD-10-CM | POA: Diagnosis not present

## 2018-03-14 DIAGNOSIS — E860 Dehydration: Secondary | ICD-10-CM | POA: Diagnosis present

## 2018-03-14 DIAGNOSIS — Z79899 Other long term (current) drug therapy: Secondary | ICD-10-CM

## 2018-03-14 DIAGNOSIS — E0810 Diabetes mellitus due to underlying condition with ketoacidosis without coma: Secondary | ICD-10-CM

## 2018-03-14 DIAGNOSIS — Z23 Encounter for immunization: Secondary | ICD-10-CM

## 2018-03-14 DIAGNOSIS — Z9641 Presence of insulin pump (external) (internal): Secondary | ICD-10-CM | POA: Diagnosis present

## 2018-03-14 DIAGNOSIS — E1110 Type 2 diabetes mellitus with ketoacidosis without coma: Secondary | ICD-10-CM | POA: Diagnosis present

## 2018-03-14 DIAGNOSIS — Z794 Long term (current) use of insulin: Secondary | ICD-10-CM

## 2018-03-14 DIAGNOSIS — R112 Nausea with vomiting, unspecified: Secondary | ICD-10-CM

## 2018-03-14 DIAGNOSIS — E876 Hypokalemia: Secondary | ICD-10-CM | POA: Diagnosis not present

## 2018-03-14 DIAGNOSIS — J9600 Acute respiratory failure, unspecified whether with hypoxia or hypercapnia: Secondary | ICD-10-CM

## 2018-03-14 LAB — URINALYSIS, ROUTINE W REFLEX MICROSCOPIC
Bacteria, UA: NONE SEEN
Bilirubin Urine: NEGATIVE
Glucose, UA: >=500 mg/dL — AB
Ketones, ur: 80 mg/dL — AB
LEUKOCYTES UA: NEGATIVE
Nitrite: NEGATIVE
PH: 5 (ref 5.0–8.0)
Protein, ur: 100 mg/dL — AB
SPECIFIC GRAVITY, URINE: 1.021 (ref 1.005–1.030)

## 2018-03-14 LAB — BLOOD GAS, VENOUS
ACID-BASE DEFICIT: 29 mmol/L — AB (ref 0.0–2.0)
BICARBONATE: 4.9 mmol/L — AB (ref 20.0–28.0)
O2 SAT: 87.3 %
PATIENT TEMPERATURE: 98.6
PCO2 VEN: 24.7 mmHg — AB (ref 44.0–60.0)
pH, Ven: 6.929 — CL (ref 7.250–7.430)
pO2, Ven: 75.5 mmHg — ABNORMAL HIGH (ref 32.0–45.0)

## 2018-03-14 LAB — HEPATIC FUNCTION PANEL
ALK PHOS: 160 U/L — AB (ref 38–126)
ALT: 40 U/L (ref 0–44)
AST: 62 U/L — AB (ref 15–41)
Albumin: 5 g/dL (ref 3.5–5.0)
BILIRUBIN DIRECT: 0.4 mg/dL — AB (ref 0.0–0.2)
Indirect Bilirubin: 1.3 mg/dL — ABNORMAL HIGH (ref 0.3–0.9)
Total Bilirubin: 1.7 mg/dL — ABNORMAL HIGH (ref 0.3–1.2)
Total Protein: 9.2 g/dL — ABNORMAL HIGH (ref 6.5–8.1)

## 2018-03-14 LAB — CBC
HCT: 52.1 % — ABNORMAL HIGH (ref 39.0–52.0)
Hemoglobin: 16.3 g/dL (ref 13.0–17.0)
MCH: 30 pg (ref 26.0–34.0)
MCHC: 31.3 g/dL (ref 30.0–36.0)
MCV: 95.8 fL (ref 80.0–100.0)
NRBC: 0 % (ref 0.0–0.2)
PLATELETS: 334 10*3/uL (ref 150–400)
RBC: 5.44 MIL/uL (ref 4.22–5.81)
RDW: 12.8 % (ref 11.5–15.5)
WBC: 35.8 10*3/uL — AB (ref 4.0–10.5)

## 2018-03-14 LAB — LIPASE, BLOOD: Lipase: 18 U/L (ref 11–51)

## 2018-03-14 LAB — CBG MONITORING, ED

## 2018-03-14 LAB — I-STAT TROPONIN, ED: Troponin i, poc: 0 ng/mL (ref 0.00–0.08)

## 2018-03-14 LAB — I-STAT CG4 LACTIC ACID, ED: LACTIC ACID, VENOUS: 11 mmol/L — AB (ref 0.5–1.9)

## 2018-03-14 MED ORDER — SODIUM BICARBONATE 8.4 % IV SOLN
50.0000 meq | Freq: Once | INTRAVENOUS | Status: AC
  Administered 2018-03-15: 50 meq via INTRAVENOUS
  Filled 2018-03-14: qty 50

## 2018-03-14 MED ORDER — SODIUM CHLORIDE 0.9 % IV BOLUS
2000.0000 mL | Freq: Once | INTRAVENOUS | Status: AC
  Administered 2018-03-14: 1000 mL via INTRAVENOUS

## 2018-03-14 MED ORDER — INSULIN REGULAR(HUMAN) IN NACL 100-0.9 UT/100ML-% IV SOLN
INTRAVENOUS | Status: DC
  Administered 2018-03-15: 7.4 [IU] via INTRAVENOUS
  Administered 2018-03-15: 100 [IU] via INTRAVENOUS
  Filled 2018-03-14 (×2): qty 100

## 2018-03-14 MED ORDER — SODIUM CHLORIDE 0.9 % IV BOLUS
1000.0000 mL | Freq: Once | INTRAVENOUS | Status: DC
  Administered 2018-03-15: 1000 mL via INTRAVENOUS

## 2018-03-14 MED ORDER — DEXTROSE-NACL 5-0.45 % IV SOLN
INTRAVENOUS | Status: DC
  Administered 2018-03-15: 10:00:00 via INTRAVENOUS

## 2018-03-14 MED ORDER — SODIUM CHLORIDE 0.9 % IV SOLN
INTRAVENOUS | Status: DC
  Administered 2018-03-15: via INTRAVENOUS

## 2018-03-14 NOTE — ED Notes (Signed)
Pt aware urine sample is needed 

## 2018-03-14 NOTE — ED Notes (Signed)
Patient given urinal and is aware a urine specimen is needed.

## 2018-03-14 NOTE — ED Provider Notes (Signed)
[facility]-EMERGENCY DEPT Provider Note   CSN: [id number] Arrival date & time: 03/14/18  2144     History   Chief Complaint Chief Complaint  Patient presents with  . Emesis  . Hyperglycemia    HPI Mason Mclaughlin is a 22 y.o. male.  22 year old male with hospital history including IDDM who presents with hyperglycemia and vomiting.  Patient states that he began having nausea and vomiting today associated with hyperglycemia.  He has had his pump on and it has been running until 7 PM when the batteries died.  He reports some chest pain and occasional shortness of breath, he is not able to describe it.  No cough/cold symptoms or recent illness.  No fevers.  Denies alcohol or drug use.  LEVEL 5 CAVEAT DUE TO PATIENT DISTRESS  The history is provided by the patient and a significant other.  Emesis    Hyperglycemia  Associated symptoms: vomiting     Past Medical History:  Diagnosis Date  . Diabetes mellitus without complication [facility])     Patient Active Problem List   Diagnosis Date Noted  . DKA (diabetic ketoacidoses) ([facility]) 05/29/2017    History reviewed. No pertinent surgical history.      Home Medications    Prior to Admission medications   Medication Sig Start Date End Date Taking? Authorizing Provider  Insulin Human (INSULIN PUMP) SOLN 10 UNITS OF NOVOLOG 100 UNITS/ML PER EVERY GRAM OF CARBOHYDRATES VIA INSULIN PUMP   Yes [provider]  acetaminophen (TYLENOL) 500 MG tablet Take 1,500 mg by mouth daily as needed for moderate pain.     [provider]  GuaiFENesin (COUGH SYRUP PO) Take 30 mLs by mouth 2 (two) times daily as needed (FLU, COUGH).    [provider]    Family History Family History  Family history unknown: Yes    Social History Social History   Tobacco Use  . Smoking status: Never Smoker  . Smokeless tobacco: Never Used  Substance Use Topics  . Alcohol use: Yes  . Drug use: Yes   Frequency: 4.0 times per week    Types: Marijuana     Allergies   Patient has no known allergies.   Review of Systems Review of Systems  Unable to perform ROS: Other  Gastrointestinal: Positive for vomiting.   Patient distress  Physical Exam Updated Vital Signs BP 133/79 (BP Location: Right Arm)   Pulse (!) 138   Temp (!) 97.4 F (36.3 C) (Oral)   Resp 18   SpO2 100%   Physical Exam  Constitutional: He is oriented to person, place, and time. He appears well-developed and well-nourished.  Ill appearing, uncomfortable  HENT:  Head: Normocephalic and atraumatic.  dry mucous membranes  Eyes: Conjunctivae are normal.  Neck: Neck supple.  Cardiovascular: Regular rhythm and normal heart sounds. Tachycardia present.  No murmur heard. Pulmonary/Chest: Breath sounds normal. No respiratory distress.  Mild tachypnea  Abdominal: Soft. Bowel sounds are normal. He exhibits no distension. There is no tenderness.  Musculoskeletal: He exhibits no edema.  Neurological: He is alert and oriented to person, place, and time.  Fluent speech  Skin: Skin is warm and dry.  Psychiatric: He has a normal mood and affect. Judgment normal.  Nursing note and vitals reviewed.    ED Treatments / Results  Labs (all labs ordered are listed, but only abnormal results are displayed) Labs Reviewed  CBC - Abnormal; Notable for the following components:      Result  Value   WBC 35.8 (*)    HCT 52.1 (*)    All other components within normal limits  URINALYSIS, ROUTINE W REFLEX MICROSCOPIC - Abnormal; Notable for the following components:   Glucose, UA >=500 (*)    Hgb urine dipstick MODERATE (*)    Ketones, ur 80 (*)    Protein, ur 100 (*)    All other components within normal limits  HEPATIC FUNCTION PANEL - Abnormal; Notable for the following components:   Total Protein 9.2 (*)    AST 62 (*)    Alkaline Phosphatase 160 (*)    Total Bilirubin 1.7 (*)    Bilirubin, Direct 0.4 (*)    Indirect  Bilirubin 1.3 (*)    All other components within normal limits  BLOOD GAS, VENOUS - Abnormal; Notable for the following components:   pH, Ven 6.929 (*)    pCO2, Ven 24.7 (*)    pO2, Ven 75.5 (*)    Bicarbonate 4.9 (*)    Acid-base deficit 29.0 (*)    All other components within normal limits  CBG MONITORING, ED - Abnormal; Notable for the following components:   Glucose-Capillary >600 (*)    All other components within normal limits  I-STAT CG4 LACTIC ACID, ED - Abnormal; Notable for the following components:   Lactic Acid, Venous 11.00 (*)    All other components within normal limits  LIPASE, BLOOD  BASIC METABOLIC PANEL  I-STAT TROPONIN, ED    EKG EKG Interpretation  Date/Time:  Tuesday March 14 2018 23:07:51 EDT Ventricular Rate:  134 PR Interval:    QRS Duration: 102 QT Interval:  325 QTC Calculation: 486 R Axis:   65 Text Interpretation:  Sinus tachycardia Multiple premature complexes, vent & supraven Minimal ST depression, inferior leads Borderline ST elevation, anterior leads Borderline prolonged QT interval peaked T waves concerning for hyperkalemia Confirmed by Frederick Peers [phone]) on 03/14/2018 11:24:19 PM   Radiology Dg Chest Port 1 View  Result Date: 03/14/2018 CLINICAL DATA:  Nausea, vomiting.  Chest pain EXAM: PORTABLE CHEST 1 VIEW COMPARISON:  None. FINDINGS: Heart and mediastinal contours are within normal limits. No focal opacities or effusions. No acute bony abnormality. IMPRESSION: No active disease. Electronically Signed   By: Charlett Nose M.D.   On: 03/14/2018 23:29    Procedures .Critical Care Performed by: Laurence Spates, MD Authorized by: Laurence Spates, MD   Critical care provider statement:    Critical care time (minutes):  30   Critical care time was exclusive of:  Separately billable procedures and treating other patients   Critical care was necessary to treat or prevent imminent or life-threatening deterioration of the  following conditions:  Endocrine crisis   Critical care was time spent personally by me on the following activities:  Development of treatment plan with patient or surrogate, discussions with consultants, examination of patient, obtaining history from patient or surrogate, ordering and performing treatments and interventions, ordering and review of laboratory studies, ordering and review of radiographic studies, re-evaluation of patient's condition and review of old charts   (including critical care time)  Medications Ordered in ED Medications  insulin regular, human (MYXREDLIN) 100 units/ 100 mL infusion (has no administration in time range)  sodium chloride 0.9 % bolus 1,000 mL (1,000 mLs Intravenous New Bag/Given 03/15/18 0007)    And  0.9 %  sodium chloride infusion (has no administration in time range)  dextrose 5 %-0.45 % sodium chloride infusion (has no administration in time range)  sodium chloride 0.9 % bolus 2,000 mL (0 mLs Intravenous Stopped 03/15/18 0007)  sodium bicarbonate injection 50 mEq (50 mEq Intravenous Given 03/15/18 0005)     Initial Impression / Assessment and Plan / ED Course  I have reviewed the triage vital signs and the nursing notes.  Pertinent labs & imaging results that were available during my care of the patient were reviewed by me and considered in my medical decision making (see chart for details).    Ill appearing on exam, afebrile, HR 130s, BP and O2 sat normal. No focal abd tenderness. LAbs show lactate 11, WBC 35.8, glucose 745, potassium greater than 7.5, bicarb undetectable, creatinine 2.  Labs are consistent with severe DKA.  Gave the patient several liters of IV fluids, bicarb, and insulin drip.  EKG shows severely peaked T waves consistent with hyperkalemia, QTc 486.  Discussed with critical care, Dr. Sung Amabile, and pt will be admitted to MICU for further treatment.  Final Clinical Impressions(s) / ED Diagnoses   Final diagnoses:  Diabetic  ketoacidosis without coma associated with type 1 diabetes mellitus (HCC)  Nausea and vomiting, intractability of vomiting not specified, unspecified vomiting type    ED Discharge Orders    None       Little, Ambrose Finland, MD 03/15/18 0009

## 2018-03-14 NOTE — ED Notes (Signed)
I gave critical I stat CG4 to MD Little

## 2018-03-14 NOTE — ED Triage Notes (Signed)
Patient c/o N/V and hyperglycemia today. Hx type 1 diabetes. Reports pump battery died x2 hours ago.

## 2018-03-15 ENCOUNTER — Other Ambulatory Visit: Payer: Self-pay

## 2018-03-15 DIAGNOSIS — E0810 Diabetes mellitus due to underlying condition with ketoacidosis without coma: Secondary | ICD-10-CM | POA: Diagnosis not present

## 2018-03-15 DIAGNOSIS — Z79899 Other long term (current) drug therapy: Secondary | ICD-10-CM | POA: Diagnosis not present

## 2018-03-15 DIAGNOSIS — R112 Nausea with vomiting, unspecified: Secondary | ICD-10-CM | POA: Diagnosis not present

## 2018-03-15 DIAGNOSIS — Z9641 Presence of insulin pump (external) (internal): Secondary | ICD-10-CM | POA: Diagnosis present

## 2018-03-15 DIAGNOSIS — N179 Acute kidney failure, unspecified: Secondary | ICD-10-CM | POA: Diagnosis present

## 2018-03-15 DIAGNOSIS — Z794 Long term (current) use of insulin: Secondary | ICD-10-CM | POA: Diagnosis not present

## 2018-03-15 DIAGNOSIS — Z23 Encounter for immunization: Secondary | ICD-10-CM | POA: Diagnosis not present

## 2018-03-15 DIAGNOSIS — E1010 Type 1 diabetes mellitus with ketoacidosis without coma: Principal | ICD-10-CM

## 2018-03-15 DIAGNOSIS — E860 Dehydration: Secondary | ICD-10-CM | POA: Diagnosis present

## 2018-03-15 DIAGNOSIS — E876 Hypokalemia: Secondary | ICD-10-CM | POA: Diagnosis not present

## 2018-03-15 LAB — PROCALCITONIN: PROCALCITONIN: 8.44 ng/mL

## 2018-03-15 LAB — COMPREHENSIVE METABOLIC PANEL
ALBUMIN: 3.9 g/dL (ref 3.5–5.0)
ALK PHOS: 87 U/L (ref 38–126)
ALT: 27 U/L (ref 0–44)
ANION GAP: 14 (ref 5–15)
AST: 29 U/L (ref 15–41)
BUN: 20 mg/dL (ref 6–20)
CALCIUM: 8.7 mg/dL — AB (ref 8.9–10.3)
CHLORIDE: 108 mmol/L (ref 98–111)
CO2: 18 mmol/L — AB (ref 22–32)
Creatinine, Ser: 1.12 mg/dL (ref 0.61–1.24)
GFR calc Af Amer: >60 mL/min (ref >60)
GFR calc non Af Amer: >60 mL/min (ref >60)
GLUCOSE: 185 mg/dL — AB (ref 70–99)
Potassium: 3.9 mmol/L (ref 3.5–5.1)
SODIUM: 140 mmol/L (ref 135–145)
Total Bilirubin: 1 mg/dL (ref 0.3–1.2)
Total Protein: 7.4 g/dL (ref 6.5–8.1)

## 2018-03-15 LAB — GLUCOSE, CAPILLARY
GLUCOSE-CAPILLARY: 329 mg/dL — AB (ref 70–99)
GLUCOSE-CAPILLARY: 286 mg/dL — AB (ref 70–99)
GLUCOSE-CAPILLARY: 200 mg/dL — AB (ref 70–99)
GLUCOSE-CAPILLARY: 166 mg/dL — AB (ref 70–99)
GLUCOSE-CAPILLARY: 97 mg/dL (ref 70–99)
GLUCOSE-CAPILLARY: 74 mg/dL (ref 70–99)
GLUCOSE-CAPILLARY: 232 mg/dL — AB (ref 70–99)
GLUCOSE-CAPILLARY: 133 mg/dL — AB (ref 70–99)
Glucose-Capillary: 110 mg/dL — ABNORMAL HIGH (ref 70–99)
Glucose-Capillary: 156 mg/dL — ABNORMAL HIGH (ref 70–99)
Glucose-Capillary: 166 mg/dL — ABNORMAL HIGH (ref 70–99)
Glucose-Capillary: 191 mg/dL — ABNORMAL HIGH (ref 70–99)
Glucose-Capillary: 233 mg/dL — ABNORMAL HIGH (ref 70–99)
Glucose-Capillary: 252 mg/dL — ABNORMAL HIGH (ref 70–99)
Glucose-Capillary: 256 mg/dL — ABNORMAL HIGH (ref 70–99)
Glucose-Capillary: 364 mg/dL — ABNORMAL HIGH (ref 70–99)
Glucose-Capillary: 460 mg/dL — ABNORMAL HIGH (ref 70–99)
Glucose-Capillary: 557 mg/dL (ref 70–99)
Glucose-Capillary: 60 mg/dL — ABNORMAL LOW (ref 70–99)
Glucose-Capillary: 66 mg/dL — ABNORMAL LOW (ref 70–99)

## 2018-03-15 LAB — BASIC METABOLIC PANEL
Anion gap: 24 — ABNORMAL HIGH (ref 5–15)
Anion gap: 18 — ABNORMAL HIGH (ref 5–15)
BUN: 28 mg/dL — ABNORMAL HIGH (ref 6–20)
BUN: 27 mg/dL — AB (ref 6–20)
BUN: 23 mg/dL — AB (ref 6–20)
CO2: 9 mmol/L — ABNORMAL LOW (ref 22–32)
CO2: 14 mmol/L — ABNORMAL LOW (ref 22–32)
CREATININE: 1.8 mg/dL — AB (ref 0.61–1.24)
CREATININE: 1.59 mg/dL — AB (ref 0.61–1.24)
Calcium: 9.9 mg/dL (ref 8.9–10.3)
Calcium: 8.6 mg/dL — ABNORMAL LOW (ref 8.9–10.3)
Calcium: 9.2 mg/dL (ref 8.9–10.3)
Chloride: 96 mmol/L — ABNORMAL LOW (ref 98–111)
Chloride: 106 mmol/L (ref 98–111)
Chloride: 110 mmol/L (ref 98–111)
Creatinine, Ser: 2.08 mg/dL — ABNORMAL HIGH (ref 0.61–1.24)
GFR calc Af Amer: 51 mL/min — ABNORMAL LOW (ref >60)
GFR calc Af Amer: >60 mL/min (ref >60)
GFR calc Af Amer: >60 mL/min (ref >60)
GFR calc non Af Amer: 44 mL/min — ABNORMAL LOW (ref >60)
GFR, EST NON AFRICAN AMERICAN: 52 mL/min — AB (ref >60)
GLUCOSE: 745 mg/dL — AB (ref 70–99)
Glucose, Bld: 507 mg/dL (ref 70–99)
Glucose, Bld: 304 mg/dL — ABNORMAL HIGH (ref 70–99)
POTASSIUM: 5.4 mmol/L — AB (ref 3.5–5.1)
Potassium: 4.6 mmol/L (ref 3.5–5.1)
SODIUM: 139 mmol/L (ref 135–145)
SODIUM: 142 mmol/L (ref 135–145)
Sodium: 134 mmol/L — ABNORMAL LOW (ref 135–145)

## 2018-03-15 LAB — BLOOD GAS, VENOUS
ACID-BASE DEFICIT: 19.7 mmol/L — AB (ref 0.0–2.0)
Acid-Base Excess: 2.5 mmol/L — ABNORMAL HIGH (ref 0.0–2.0)
Acid-base deficit: 3.3 mmol/L — ABNORMAL HIGH (ref 0.0–2.0)
Acid-base deficit: 2.2 mmol/L — ABNORMAL HIGH (ref 0.0–2.0)
BICARBONATE: 26.5 mmol/L (ref 20.0–28.0)
Bicarbonate: 7.6 mmol/L — ABNORMAL LOW (ref 20.0–28.0)
Bicarbonate: 20.6 mmol/L (ref 20.0–28.0)
Bicarbonate: 22.5 mmol/L (ref 20.0–28.0)
O2 SAT: 88.9 %
O2 Saturation: 88.5 %
O2 Saturation: 58.2 %
O2 Saturation: 53.5 %
PATIENT TEMPERATURE: 37
PCO2 VEN: 21 mmHg — AB (ref 44.0–60.0)
PCO2 VEN: 35.3 mmHg — AB (ref 44.0–60.0)
PCO2 VEN: 40.5 mmHg — AB (ref 44.0–60.0)
PH VEN: 7.181 — AB (ref 7.250–7.430)
PH VEN: 7.384 (ref 7.250–7.430)
PH VEN: 7.363 (ref 7.250–7.430)
PH VEN: 7.431 — AB (ref 7.250–7.430)
Patient temperature: 37
Patient temperature: 37
Patient temperature: 37
pCO2, Ven: 40.6 mmHg — ABNORMAL LOW (ref 44.0–60.0)
pO2, Ven: 58.3 mmHg — ABNORMAL HIGH (ref 32.0–45.0)
pO2, Ven: 51.8 mmHg — ABNORMAL HIGH (ref 32.0–45.0)

## 2018-03-15 LAB — RAPID URINE DRUG SCREEN, HOSP PERFORMED
Amphetamines: NOT DETECTED
BARBITURATES: NOT DETECTED
Benzodiazepines: NOT DETECTED
Cocaine: NOT DETECTED
Opiates: NOT DETECTED
TETRAHYDROCANNABINOL: NOT DETECTED

## 2018-03-15 LAB — CBG MONITORING, ED: Glucose-Capillary: 572 mg/dL (ref 70–99)

## 2018-03-15 LAB — AMYLASE: Amylase: 317 U/L — ABNORMAL HIGH (ref 28–100)

## 2018-03-15 LAB — LACTIC ACID, PLASMA
LACTIC ACID, VENOUS: 5.6 mmol/L — AB (ref 0.5–1.9)
LACTIC ACID, VENOUS: 3.7 mmol/L — AB (ref 0.5–1.9)
LACTIC ACID, VENOUS: 1.2 mmol/L (ref 0.5–1.9)

## 2018-03-15 LAB — MAGNESIUM: Magnesium: 2.3 mg/dL (ref 1.7–2.4)

## 2018-03-15 LAB — SALICYLATE LEVEL: Salicylate Lvl: <7 mg/dL (ref 2.8–30.0)

## 2018-03-15 LAB — HEMOGLOBIN A1C
Hgb A1c MFr Bld: 12.2 % — ABNORMAL HIGH (ref 4.8–5.6)
Mean Plasma Glucose: 303.44 mg/dL

## 2018-03-15 LAB — ACETAMINOPHEN LEVEL

## 2018-03-15 LAB — MRSA PCR SCREENING: MRSA BY PCR: NEGATIVE

## 2018-03-15 LAB — PHOSPHORUS: Phosphorus: 3 mg/dL (ref 2.5–4.6)

## 2018-03-15 MED ORDER — ACETAMINOPHEN 325 MG PO TABS
650.0000 mg | ORAL_TABLET | Freq: Four times a day (QID) | ORAL | Status: DC | PRN
  Administered 2018-03-15: 650 mg via ORAL
  Filled 2018-03-15: qty 2

## 2018-03-15 MED ORDER — PIPERACILLIN-TAZOBACTAM 3.375 G IVPB
3.3750 g | Freq: Three times a day (TID) | INTRAVENOUS | Status: DC
  Administered 2018-03-15 – 2018-03-16 (×4): 3.375 g via INTRAVENOUS
  Filled 2018-03-15 (×4): qty 50

## 2018-03-15 MED ORDER — HEPARIN SODIUM (PORCINE) 5000 UNIT/ML IJ SOLN
5000.0000 [IU] | Freq: Three times a day (TID) | INTRAMUSCULAR | Status: DC
  Administered 2018-03-15 – 2018-03-16 (×2): 5000 [IU] via SUBCUTANEOUS
  Filled 2018-03-15 (×3): qty 1

## 2018-03-15 MED ORDER — INFLUENZA VAC SPLIT QUAD 0.5 ML IM SUSY
0.5000 mL | PREFILLED_SYRINGE | INTRAMUSCULAR | Status: AC
  Administered 2018-03-17: 0.5 mL via INTRAMUSCULAR
  Filled 2018-03-15: qty 0.5

## 2018-03-15 MED ORDER — INSULIN PUMP
SUBCUTANEOUS | Status: DC
  Administered 2018-03-15: 3.5 via SUBCUTANEOUS
  Administered 2018-03-15: 18:00:00 via SUBCUTANEOUS
  Administered 2018-03-16: 1.1 via SUBCUTANEOUS
  Administered 2018-03-16 (×2): via SUBCUTANEOUS
  Filled 2018-03-15: qty 1

## 2018-03-15 MED ORDER — LACTATED RINGERS IV SOLN
INTRAVENOUS | Status: DC
  Administered 2018-03-15: 01:00:00 via INTRAVENOUS

## 2018-03-15 MED ORDER — STERILE WATER FOR INJECTION IV SOLN
INTRAVENOUS | Status: DC
  Administered 2018-03-15 (×2): via INTRAVENOUS
  Filled 2018-03-15 (×2): qty 850

## 2018-03-15 NOTE — Progress Notes (Addendum)
..   NAME:  Mason Mclaughlin, MRN:  [id number], DOB:  1995/12/07, LOS: 0 ADMISSION DATE:  03/14/2018, CONSULTATION DATE:  03/15/18 REFERRING MD:  Lilia Pro MD- ER, CHIEF COMPLAINT:  Emesis and hyperglycemia   Brief History   22 yr old male with PMHx IDDM presents with nausea and vomiting. He reports that the battery to his insulin pump died 2 hrs prior to coming to the ER. Found to have BG>700 and severe metabolic acidosis LA 11. PCCM consulted for admission.  Past Medical History  IDDM- managed with [employer] insulin pump  Consults: date of consult/date signed off & final recs:  No other consults at time of evaluation   Procedures (surgical and bedside):  None  Significant Diagnostic Tests:  WBC 35.8 Hgb 16 Hct 52 Plts 334 LA 11 Alk phos 160 AST 62 ALT 40T bili 1.7 Lipase 18 BMP : Na + 134 K+ >7.5 Cl-96 Bicarb <7  Blood glucose 745 AG 31 BUN 28 Cr 2.08 UA >500 glucose with Ketones an leukocyte nitrite negative + hyaline casts  Chest x-ray 03/14/2018-clear lungs, no active disease.  I have reviewed the images personally.  Micro Data:  Blood cx 10/23 >   Antimicrobials:  Zosyn 10/23 >   Subjective:  Patient was asleep at time of my examination Mother at bedside bedside states that overall he is doing better  Objective   Blood pressure 125/87, pulse (!) 120, temperature 98.7 F (37.1 C), temperature source Oral, resp. rate 17, height '5\' 6"'  (1.676 m), weight 56.1 kg, SpO2 100 %.        Intake/Output Summary (Last 24 hours) at 03/15/2018 0954 Last data filed at 03/15/2018 1916 Gross per 24 hour  Intake 2104.1 ml  Output 1100 ml  Net 1004.1 ml   Filed Weights   03/15/18 0200  Weight: 56.1 kg    Examination: Gen:      No acute distress HEENT:  EOMI, sclera anicteric Neck:     No masses; no thyromegaly Lungs:    Clear to auscultation bilaterally; normal respiratory effort CV:         Regular rate and rhythm; no murmurs Abd:      + bowel sounds; soft, non-tender; no palpable  masses, no distension Ext:    No edema; adequate peripheral perfusion Skin:      Warm and dry; no rash Neuro: alert and oriented x 3 Psych: normal mood and affect  Assessment & Plan:  Anion Gap Metabolic Acidosis- Diabetic ketoacidosis and Lactic Acidosis: Type 1 diabetes Pump only died 2 hrs prior to presentation symptoms have been all day Pump entry site is not infiltrated or inflamed on exam Plan: Continue insulin drip, bicarb drip Repeat labs Change LR to D5 half-normal as sugars are below 200 Used to follow at [facility], Pediatric endocrinology for type 1 diabetes but has not kept up since January 2019 He needs to be set up with Adult endocrinology at [facility]. Consult diabetes coordinator.  2. Transaminases with elevated Alk phos, elevated T bili noted and h/o emesis Suspicion for cholecystitis and less likely gastroenteritis No fever no diarrhea Negative murphy's on exam no rebound or guarding  Plan: Follow LFTs  3. Hemoconcentration on CBC -hyperosmolar state due to DKA Plan: Continue IV fluid resuscitation.  4. Leukocytosis: No clear source of infection. Plan:  Started Zosyn empirically for elevated procalcitonin Follow cultures.  5. Acute Kidney Injury- secondary to dehydration Follow urine output and creatinine.  6. Pseudohyperkalemia Secondary to intracellular to extracellular shifts occurring with  severe acidosis Plan: Repeat labs.  Disposition / Summary of Today's Plan 03/15/18   Continue with insulin drip, start D5 half-normal Repeat labs, place electrolytes as needed. Consult diabetes coordinator.    Diet: NPO until AG closes Pain/Anxiety/Delirium protocol (if indicated): not indicated at this time VAP protocol (if indicated): not intubated DVT prophylaxis: SCDs and heparin  GI prophylaxis: pepcid Hyperglycemia protocol: insulin gtt Mobility: bedrest Code Status: Full Family Communication: Mom updated at bedside.  Labs   CBC: Recent Labs    Lab 03/14/18 2235  WBC 35.8*  HGB 16.3  HCT 52.1*  MCV 95.8  PLT 841    Basic Metabolic Panel: Recent Labs  Lab 03/14/18 2235 03/15/18 0300 03/15/18 0330 03/15/18 0704  NA 134* 139  --  142  K >7.5* 5.4*  --  4.6  CL 96* 106  --  110  CO2 <7* 9*  --  14*  GLUCOSE 745* 507*  --  304*  BUN 28* 27*  --  23*  CREATININE 2.08* 1.80*  --  1.59*  CALCIUM 9.9 8.6*  --  9.2  MG  --   --  2.3  --   PHOS  --   --  3.0  --    GFR: Estimated Creatinine Clearance: 58.3 mL/min (A) (by C-G formula based on SCr of 1.59 mg/dL (H)). Recent Labs  Lab 03/14/18 2235 03/14/18 2317 03/15/18 0127 03/15/18 0300  PROCALCITON  --   --  8.44  --   WBC 35.8*  --   --   --   LATICACIDVEN  --  11.00* 5.6* 3.7*    Liver Function Tests: Recent Labs  Lab 03/14/18 2235  AST 62*  ALT 40  ALKPHOS 160*  BILITOT 1.7*  PROT 9.2*  ALBUMIN 5.0   Recent Labs  Lab 03/14/18 2235  LIPASE 18   No results for input(s): AMMONIA in the last 168 hours.  ABG    Component Value Date/Time   HCO3 7.6 (L) 03/15/2018 0250   ACIDBASEDEF 19.7 (H) 03/15/2018 0250   O2SAT 88.5 03/15/2018 0250     Coagulation Profile: No results for input(s): INR, PROTIME in the last 168 hours.  Cardiac Enzymes: No results for input(s): CKTOTAL, CKMB, CKMBINDEX, TROPONINI in the last 168 hours.  HbA1C: Hgb A1c MFr Bld  Date/Time Value Ref Range Status  03/15/2018 12:16 AM 12.2 (H) 4.8 - 5.6 % Final    Comment:    (NOTE) Pre diabetes:          5.7%-6.4% Diabetes:              >6.4% Glycemic control for   <7.0% adults with diabetes   05/30/2017 06:36 AM 11.0 (H) 4.8 - 5.6 % Final    Comment:    (NOTE) Pre diabetes:          5.7%-6.4% Diabetes:              >6.4% Glycemic control for   <7.0% adults with diabetes     CBG: Recent Labs  Lab 03/15/18 0414 03/15/18 0519 03/15/18 0614 03/15/18 0817 03/15/18 0916  GLUCAP 364* 329* 286* 233* 191*    The patient is critically ill with multiple organ  system failure and requires high complexity decision making for assessment and support, frequent evaluation and titration of therapies, advanced monitoring, review of radiographic studies and interpretation of complex data.   Critical Care Time devoted to patient care services, exclusive of separately billable procedures, described in this note is 53  minutes.   Marshell Garfinkel MD [facility] Pager [phone] If no answer or after 3pm call: [phone] 03/15/2018, 10:14 AM

## 2018-03-15 NOTE — Progress Notes (Signed)
After 4oz of juice, pt. CBG=110. MD notified. Restarted insulin drip @1 .8U.  Consulted with Diabetic coordinator. Called [employer], to do a self test. Pump is now working. Will page MD for insulin order set.

## 2018-03-15 NOTE — Progress Notes (Signed)
Pt. CBG=66. Turned off drip. Rechecked sugar,CBG=74. Gave pt. 4oz of juice. Will recheck in 15 min.

## 2018-03-15 NOTE — ED Notes (Signed)
ED TO INPATIENT HANDOFF REPORT  Name/Age/Gender Mason Mclaughlin 22 y.o. male  Code Status    Code Status Orders  (From admission, onward)         Start     Ordered   03/15/18 0014  Full code  Continuous     03/15/18 0018        Code Status History    Date Active Date Inactive Code Status Order ID Comments User Context   05/29/2017 1434 05/30/2017 1637 Full Code [id number]  Theodis Blaze, MD ED      Home/SNF/Other Home  Chief Complaint Emesis;hyperglycemia  Level of Care/Admitting Diagnosis ED Disposition    ED Disposition Condition [place]: [facility] [[id number]]  Level of Care: ICU [6]  Diagnosis: DKA (diabetic ketoacidoses) [facility]) [[id number]]  Admitting Physician: Kandice Hams [[id number]]  Attending Physician: Kandice Hams [[id number]]  Estimated length of stay: past midnight tomorrow  Certification:: I certify this patient will need inpatient services for at least 2 midnights  PT Class (Do Not Modify): Inpatient [101]  PT Acc Code (Do Not Modify): Private [1]       Medical History Past Medical History:  Diagnosis Date  . Diabetes mellitus without complication ([place])     Allergies No Known Allergies  IV Location/Drains/Wounds Patient Lines/Drains/Airways Status   Active Line/Drains/Airways    Name:   Placement date:   Placement time:   Site:   Days:   Peripheral IV 03/14/18 Left Antecubital   03/14/18    2243    Antecubital   1          Labs/Imaging Results for orders placed or performed during the hospital encounter of 03/14/18 (from the past 48 hour(s))  CBG monitoring, ED     Status: Abnormal   Collection Time: 03/14/18 10:24 PM  Result Value Ref Range   Glucose-Capillary >600 (HH) 70 - 99 mg/dL  Basic metabolic panel     Status: Abnormal   Collection Time: 03/14/18 10:35 PM  Result Value Ref Range   Sodium 134 (L) 135 - 145 mmol/L   Potassium >7.5 (HH) 3.5 - 5.1 mmol/L    Comment: NO  VISIBLE HEMOLYSIS LITTLE,RACHEL AT 0008 03/15/18 BY TIBBITTS,K    Chloride 96 (L) 98 - 111 mmol/L   CO2 <7 (L) 22 - 32 mmol/L   Glucose, Bld 745 (HH) 70 - 99 mg/dL    Comment: CRITICAL RESULT CALLED TO, READ BACK BY AND VERIFIED WITH: LITTLE,RACHEL AT 0008 03/15/18 BY TIBBITTS,K    BUN 28 (H) 6 - 20 mg/dL   Creatinine, Ser 2.08 (H) 0.61 - 1.24 mg/dL   Calcium 9.9 8.9 - 10.3 mg/dL   GFR calc non Af Amer 44 (L) >60 mL/min   GFR calc Af Amer 51 (L) >60 mL/min    Comment: (NOTE) The eGFR has been calculated using the CKD EPI equation. This calculation has not been validated in all clinical situations. eGFR's persistently <60 mL/min signify possible Chronic Kidney Disease.    Anion gap NOT CALCULATED 5 - 15    Comment: Performed at [facility], [place] [address] [place], [place] [address]  CBC     Status: Abnormal   Collection Time: 03/14/18 10:35 PM  Result Value Ref Range   WBC 35.8 (H) 4.0 - 10.5 K/uL   RBC 5.44 4.22 - 5.81 MIL/uL   Hemoglobin 16.3 13.0 - 17.0 g/dL   HCT 52.1 (H) 39.0 - 52.0 %  MCV 95.8 80.0 - 100.0 fL   MCH 30.0 26.0 - 34.0 pg   MCHC 31.3 30.0 - 36.0 g/dL   RDW 12.8 11.5 - 15.5 %   Platelets 334 150 - 400 K/uL   nRBC 0.0 0.0 - 0.2 %    Comment: Performed at [facility], [place] [address], [place], [place] [address]  Urinalysis, Routine w reflex microscopic     Status: Abnormal   Collection Time: 03/14/18 10:35 PM  Result Value Ref Range   Color, Urine YELLOW YELLOW   APPearance CLEAR CLEAR   Specific Gravity, Urine 1.021 1.005 - 1.030   pH 5.0 5.0 - 8.0   Glucose, UA >=500 (A) NEGATIVE mg/dL   Hgb urine dipstick MODERATE (A) NEGATIVE   Bilirubin Urine NEGATIVE NEGATIVE   Ketones, ur 80 (A) NEGATIVE mg/dL   Protein, ur 100 (A) NEGATIVE mg/dL   Nitrite NEGATIVE NEGATIVE   Leukocytes, UA NEGATIVE NEGATIVE   RBC / HPF 0-5 0 - 5 RBC/hpf   WBC, UA 0-5 0 - 5 WBC/hpf   Bacteria, UA NONE SEEN NONE SEEN   Squamous  Epithelial / LPF 0-5 0 - 5   Mucus PRESENT    Hyaline Casts, UA PRESENT     Comment: Performed at [facility], [place] [address], [place], [place] [address]  Hepatic function panel     Status: Abnormal   Collection Time: 03/14/18 10:35 PM  Result Value Ref Range   Total Protein 9.2 (H) 6.5 - 8.1 g/dL   Albumin 5.0 3.5 - 5.0 g/dL   AST 62 (H) 15 - 41 U/L   ALT 40 0 - 44 U/L   Alkaline Phosphatase 160 (H) 38 - 126 U/L   Total Bilirubin 1.7 (H) 0.3 - 1.2 mg/dL   Bilirubin, Direct 0.4 (H) 0.0 - 0.2 mg/dL   Indirect Bilirubin 1.3 (H) 0.3 - 0.9 mg/dL    Comment: Performed at [facility], [place] [address] [place], [place] [address]  Lipase, blood     Status: None   Collection Time: 03/14/18 10:35 PM  Result Value Ref Range   Lipase 18 11 - 51 U/L    Comment: Performed at [facility], [place] [address] [place], [place] [address]  Blood gas, venous     Status: Abnormal   Collection Time: 03/14/18 11:02 PM  Result Value Ref Range   pH, Ven 6.929 (LL) 7.250 - 7.430    Comment: CRITICAL RESULT CALLED TO, READ BACK BY AND VERIFIED WITH: RACHEL LITTLE, M.D. AT [place] RRT, RCP ON 03/14/2018    pCO2, Ven 24.7 (L) 44.0 - 60.0 mmHg   pO2, Ven 75.5 (H) 32.0 - 45.0 mmHg   Bicarbonate 4.9 (L) 20.0 - 28.0 mmol/L   Acid-base deficit 29.0 (H) 0.0 - 2.0 mmol/L   O2 Saturation 87.3 %   Patient temperature 98.6    Collection site VEIN    Drawn by DRAWN BY RN    Sample type VEIN     Comment: Performed at [facility], [place] [address], [place], [place] [address]  I-stat troponin, ED     Status: None   Collection Time: 03/14/18 11:15 PM  Result Value Ref Range   Troponin i, poc 0.00 0.00 - 0.08 ng/mL   Comment 3            Comment: Due to the release kinetics of cTnI, a negative result within the first hours of the  onset of symptoms does not rule out myocardial infarction with certainty. If myocardial infarction  is still suspected, repeat the test at appropriate intervals.   I-Stat CG4 Lactic Acid, ED     Status: Abnormal   Collection Time: 03/14/18 11:17 PM  Result Value Ref Range   Lactic Acid, Venous 11.00 (HH) 0.5 - 1.9 mmol/L   Comment NOTIFIED PHYSICIAN    Dg Chest Port 1 View  Result Date: 03/14/2018 CLINICAL DATA:  Nausea, vomiting.  Chest pain EXAM: PORTABLE CHEST 1 VIEW COMPARISON:  None. FINDINGS: Heart and mediastinal contours are within normal limits. No focal opacities or effusions. No acute bony abnormality. IMPRESSION: No active disease. Electronically Signed   By: Rolm Baptise M.D.   On: 03/14/2018 23:29   EKG Interpretation  Date/Time:  Tuesday March 14 2018 23:07:51 EDT Ventricular Rate:  134 PR Interval:    QRS Duration: 102 QT Interval:  325 QTC Calculation: 486 R Axis:   65 Text Interpretation:  Sinus tachycardia Multiple premature complexes, vent & supraven Minimal ST depression, inferior leads Borderline ST elevation, anterior leads Borderline prolonged QT interval peaked T waves concerning for hyperkalemia Confirmed by Theotis Burrow [phone]) on 03/14/2018 11:24:19 PM   Pending Labs Unresulted Labs (From admission, onward)    Start     Ordered   03/15/18 4854  Basic metabolic panel  Now then every 4 hours,   R     03/15/18 0018   03/15/18 0200  Lactic acid, plasma  STAT Now then every 3 hours,   R     03/15/18 0018   03/15/18 0016  Hemoglobin A1c  Once,   R     03/15/18 0018   03/15/18 0015  Blood gas, venous  Now then every 4 hours,   R     03/15/18 0018          Vitals/Pain Today's Vitals   03/14/18 2223  BP: 133/79  Pulse: (!) 138  Resp: 18  Temp: (!) 97.4 F (36.3 C)  TempSrc: Oral  SpO2: 100%    Isolation Precautions No active isolations  Medications Medications  insulin regular, human (MYXREDLIN) 100 units/ 100 mL infusion (100 Units Intravenous New Bag/Given 03/15/18 0014)  0.9 %  sodium chloride infusion ( Intravenous New Bag/Given  03/15/18 0017)  dextrose 5 %-0.45 % sodium chloride infusion (has no administration in time range)  heparin injection 5,000 Units (has no administration in time range)  lactated ringers infusion (has no administration in time range)  sodium bicarbonate 150 mEq in sterile water 1,000 mL infusion (has no administration in time range)  sodium chloride 0.9 % bolus 2,000 mL (0 mLs Intravenous Stopped 03/15/18 0007)  sodium bicarbonate injection 50 mEq (50 mEq Intravenous Given 03/15/18 0005)    Mobility walks

## 2018-03-15 NOTE — Progress Notes (Signed)
Inpatient Diabetes Program Recommendations  AACE/ADA: New Consensus Statement on Inpatient Glycemic Control (2015)  Target Ranges:  Prepandial:   less than 140 mg/dL      Peak postprandial:   less than 180 mg/dL (1-2 hours)      Critically ill patients:  140 - 180 mg/dL   Lab Results  Component Value Date   GLUCAP 97 03/15/2018   HGBA1C 12.2 (H) 03/15/2018    Review of Glycemic Control  Diabetes history: DM1 Outpatient Diabetes medications: Insulin pump Current orders for Inpatient glycemic control: IV insulin per DKA order set  HgbA1C - 12.2%.  Basal Settings: Time Basal Rate (u/hr)  Midnight 1.3  6:30AM 1.5  Noon 1.3  11PM 1.1   Lantus in case of pump failure 32 units   Bolus Settings: Carb Coverage: Time IC Ratio  MN 10   Sensitivity Factor (ISF) Time ISF  MN 30   Target blood glucose: 100-110 mg/dL  Insulin Duration: 3 hours  Give insulin 15 minutes BEFORE meals/snacks    Spoke with pt regarding DKA admission and insulin pump. Pt states he ran out of insulin and battery needed replacing in pump.Was followed by Pediatric endo at [facility], last appt was 05/2017. Would like local endo to manage his pump and diabetes. When asked why he didn't get a battery for his pump, he said "I don't know, I just didn't." Stressed importance of checking blood sugars. Discussed importance of improving glycemic control to reduce risks for long-term complications.    Inpatient Diabetes Program Recommendations:     Restart pump after pt does safety check by calling [phone] number to make certain it is working properly. Needs referral to local endo/PCP for diabetes management. ? Whether pt would benefit from psych consult - seems very depressed and withdrawn.    Will continue to follow.  Thank you. Ailene Ards, RD, LDN, CDE Inpatient Diabetes Coordinator [phone]

## 2018-03-15 NOTE — H&P (Addendum)
..   NAME:  Mason Mclaughlin, MRN:  [id number], DOB:  08-Oct-1995, LOS: 0 ADMISSION DATE:  03/14/2018, CONSULTATION DATE:  03/15/18 REFERRING MD:  Lilia Pro MD- ER, CHIEF COMPLAINT:  Emesis and hyperglycemia   Brief History   22 yr old male with PMHx IDDM presents with nausea and vomiting. He reports that the battery to his insulin pump died 2 hrs prior to coming to the ER. Found to have BG>700 and severe metabolic acidosis LA 11. PCCM consulted for admission.  Past Medical History  IDDM- managed with [place] insulin pump  Consults: date of consult/date signed off & final recs:  No other consults at time of evaluation  Procedures (surgical and bedside):  none  Significant Diagnostic Tests:  WBC 35.8 Hgb 16 Hct 52 Plts 334 LA 11 Alk phos 160 AST 62 ALT 40T bili 1.7 Lipase 18 BMP : Na + 134 K+ >7.5 Cl-96 Bicarb <7  Blood glucose 745 AG 31 BUN 28 Cr 2.08 UA >500 glucose with Ketones an leukocyte nitrite negative + hyaline casts Micro Data:  Blood cx pending  Antimicrobials:  None- PCT sent    Subjective:  Pt lying in bed states that he feels weak. Some nausea but improved in comparison to before. States that for the last 11 hrs he has been vomiting unable to eat anything. His insulin pump battery died. No other complaints at this time  Objective   Blood pressure 133/79, pulse (!) 138, temperature (!) 97.4 F (36.3 C), temperature source Oral, resp. rate 18, SpO2 100 %.       No intake or output data in the 24 hours ending 03/15/18 0014 There were no vitals filed for this visit.  Examination: General: thin male in no acute distress ill appearing HENT: normocephalic atraumatic with dry oral mucosa. PERRLA Lungs: clear to auscultation bilaterally no wheezing and no rhonci Cardiovascular: regular rate and rhythm S1 and S2 appreciated Abdomen: soft scaphoid non distended abdomen with diffuse tenderness on exam negative Murphy's and negative flank pain. RLQ insulin pump site intact, clean  dressing no swelling and no erythema around site. Extremities: no lower extremity edema Neuro: AAO x3 GCS 15 no focal deficits   Assessment & Plan:  1. Anion Gap Metabolic Acidosis- Diabetic ketoacidosis and Lactic Acidosis: Pump only died 2 hrs prior to presentation symptoms have been all day Pump entry site is not infiltrated or inflamed on exam Plan: - started on insulin gtt and continue until AG closes AG 31 initially - continuous IVF '@100cc' /hr - given ph 6.9 start bicarb gtt (166mq in sterile water @ 79m/hr) - BMP and VBG Q 4 - when BG <25077ml transition to D51/2 NS - will evaluate for sources of infection given the degree of acidosis present - check HgbA1c and lipid panel - check Utox, salicylates and acetaminophen level  2. Transaminases with elevated Alk phos, elevated T bili noted and h/o emesis Suspicion for cholecystitis and less likely gastroenteritis No fever no diarrhea Negative murphy's on exam no rebound or guarding Plan: Continue on IVF hydration  Repeat labs in AM  3. Hemoconcentration on CBC -hyperosmolar state due to DKA Plan: - continue on IVF repeat CBC in AM  4. Leukocytosis: No clear source of infection. No skin or soft tissue involvement on physical exam Insulin pump entry site has no signs of infiltration or inflammation CXR negative Afebrile UA leuk and nitrite negative Plan:  F/u PCT if elevated start empiric abx Trend WBC and fever curve Sent blood cx  5.  Acute Kidney Injury- secondary to dehydration - Cr 2.08 from normal baseline -  Continues on IVF - strict Is and Os - avoid nephrotoxic agents  6. Pseudohyperkalemia Secondary to intracellular to extracellular shifts occurring with severe acidosis Plan: Continue IVF and insulin gtt As acidosis corrects K= should normalize   Disposition / Summary of Today's Plan 03/15/18   Admit to ICU for management of severe AG Metabolic Acidosis Continue on Insulin gtt with IVF and Bicarb  gtt When ph >7.2 discontinue bicarb gtt BMP and VBG Q 4 until AG closes and blood glucose normalizes Will transition to insulin pump once AGMA has resolved     Diet: NPO until AG closes Pain/Anxiety/Delirium protocol (if indicated): not indicated at this time VAP protocol (if indicated): not intubated DVT prophylaxis: SCDs and heparin [place] GI prophylaxis: pepcid Hyperglycemia protocol: insulin gtt Mobility: bedrest Code Status: Full Family Communication: mom and girlfriend at bedside. patient and family updated regarding current clinical condition and plan of care  Labs   CBC: Recent Labs  Lab 03/14/18 2235  WBC 35.8*  HGB 16.3  HCT 52.1*  MCV 95.8  PLT 710    Basic Metabolic Panel: Recent Labs  Lab 03/14/18 2235  NA 134*  K >7.5*  CL 96*  CO2 <7*  GLUCOSE 745*  BUN 28*  CREATININE 2.08*  CALCIUM 9.9   GFR: CrCl cannot be calculated (Unknown ideal weight.). Recent Labs  Lab 03/14/18 2235 03/14/18 2317  WBC 35.8*  --   LATICACIDVEN  --  11.00*    Liver Function Tests: Recent Labs  Lab 03/14/18 2235  AST 62*  ALT 40  ALKPHOS 160*  BILITOT 1.7*  PROT 9.2*  ALBUMIN 5.0   Recent Labs  Lab 03/14/18 2235  LIPASE 18   No results for input(s): AMMONIA in the last 168 hours.  ABG    Component Value Date/Time   HCO3 4.9 (L) 03/14/2018 2302   ACIDBASEDEF 29.0 (H) 03/14/2018 2302   O2SAT 87.3 03/14/2018 2302     Coagulation Profile: No results for input(s): INR, PROTIME in the last 168 hours.  Cardiac Enzymes: No results for input(s): CKTOTAL, CKMB, CKMBINDEX, TROPONINI in the last 168 hours.  HbA1C: Hgb A1c MFr Bld  Date/Time Value Ref Range Status  05/30/2017 06:36 AM 11.0 (H) 4.8 - 5.6 % Final    Comment:    (NOTE) Pre diabetes:          5.7%-6.4% Diabetes:              >6.4% Glycemic control for   <7.0% adults with diabetes     CBG: Recent Labs  Lab 03/14/18 2224  GLUCAP >600*    Admitting History of Present Illness.   22  yr old male with PMHx significant for IDDM managed by insulin pump last admitted in January for DKA presents today with nausea and emesis. Pt reports vomiting >10 times within last 12 hrs and has been unable to eat.  Also reports 2 hrs w/o insulin therapy due to insulin pump battery.   Per ER's note:  Review of Systems:   Marland KitchenMarland KitchenReview of Systems  Constitutional: Positive for malaise/fatigue. Negative for chills, diaphoresis, fever and weight loss.  HENT: Negative.   Eyes: Negative.   Respiratory: Negative.   Cardiovascular: Negative.   Gastrointestinal: Positive for abdominal pain, nausea and vomiting.  Genitourinary: Negative.   Musculoskeletal: Negative.   Skin: Negative.   Neurological: Negative.   Psychiatric/Behavioral: Negative.     Past Medical History  He,  has a past medical history of Diabetes mellitus without complication ([place]).   Surgical History   History reviewed. No pertinent surgical history.   Social History   Social History   Socioeconomic History  . Marital status: Single    Spouse name: Not on file  . Number of children: Not on file  . Years of education: Not on file  . Highest education level: Not on file  Occupational History  . Not on file  Social Needs  . Financial resource strain: Not on file  . Food insecurity:    Worry: Not on file    Inability: Not on file  . Transportation needs:    Medical: Not on file    Non-medical: Not on file  Tobacco Use  . Smoking status: Never Smoker  . Smokeless tobacco: Never Used  Substance and Sexual Activity  . Alcohol use: Yes  . Drug use: Yes    Frequency: 4.0 times per week    Types: Marijuana  . Sexual activity: Not on file  Lifestyle  . Physical activity:    Days per week: Not on file    Minutes per session: Not on file  . Stress: Not on file  Relationships  . Social connections:    Talks on phone: Not on file    Gets together: Not on file    Attends religious service: Not on file    Active  member of club or organization: Not on file    Attends meetings of clubs or organizations: Not on file    Relationship status: Not on file  . Intimate partner violence:    Fear of current or ex partner: Not on file    Emotionally abused: Not on file    Physically abused: Not on file    Forced sexual activity: Not on file  Other Topics Concern  . Not on file  Social History Narrative  . Not on file  ,  reports that he has never smoked. He has never used smokeless tobacco. He reports that he drinks alcohol. He reports that he has current or past drug history. Drug: Marijuana. Frequency: 4.00 times per week.   Family History   His Family history is unknown by patient.   Allergies No Known Allergies   Home Medications  Prior to Admission medications   Medication Sig Start Date End Date Taking? Authorizing Provider  Insulin Human (INSULIN PUMP) SOLN 10 UNITS OF NOVOLOG 100 UNITS/ML PER EVERY GRAM OF CARBOHYDRATES VIA INSULIN PUMP   Yes [provider]  acetaminophen (TYLENOL) 500 MG tablet Take 1,500 mg by mouth daily as needed for moderate pain.     [provider]  GuaiFENesin (COUGH SYRUP PO) Take 30 mLs by mouth 2 (two) times daily as needed (FLU, COUGH).    [provider]     Critical care time: 65 mins     I, Dr Seward Carol have personally reviewed patient's available data, including medical history, events of note, physical examination and test results as part of my evaluation. I have discussed with NP Moshe Cipro and other care providers such as pharmacist, RN and Elink.  In addition,  I personally evaluated patient  The patient is critically ill with multiple organ systems failure and requires high complexity decision making for assessment and support, frequent evaluation and titration of therapies, application of advanced monitoring technologies and extensive interpretation of multiple databases.   Critical Care Time devoted to patient care  services described in this  note is 55 Minutes. This time reflects time of care of this signee Dr Seward Carol. This critical care time does not reflect procedure time, or teaching time or supervisory time but could involve care discussion time   Dr. Seward Carol Pulmonary Critical Care Medicine  03/15/2018 12:35 AM

## 2018-03-15 NOTE — Progress Notes (Signed)
Blood sugar 60. Insulin drip and D5 1/2 NS have been turned off since 1930. Pt is alert, clammy, vitals within normal range. Orange juice given, will recheck blood sugar in 15 minutes. E-link notified.

## 2018-03-15 NOTE — Progress Notes (Signed)
RN, Sapna aware that P02 in VBG 51.8 (s02 88.9).

## 2018-03-15 NOTE — Progress Notes (Signed)
Pharmacy Antibiotic Note  Stavros Cail is a 22 y.o. male admitted on 03/14/2018 with sepsis.  Pharmacy has been consulted for zosyn dosing.  Plan: Zosyn 3.375g IV q8h (4 hour infusion).  Height: 5\' 6"  (167.6 cm) Weight: 123 lb 10.9 oz (56.1 kg) IBW/kg (Calculated) : 63.8  Temp (24hrs), Avg:98.4 F (36.9 C), Min:97.4 F (36.3 C), Max:99.1 F (37.3 C)  Recent Labs  Lab 03/14/18 2235 03/14/18 2317 03/15/18 0127  WBC 35.8*  --   --   CREATININE 2.08*  --   --   LATICACIDVEN  --  11.00* 5.6*    Estimated Creatinine Clearance: 44.6 mL/min (A) (by C-G formula based on SCr of 2.08 mg/dL (H)).    No Known Allergies  Antimicrobials this admission: Zosyn 03/15/2018 >>   Dose adjustments this admission: -  Microbiology results: -  Thank you for allowing pharmacy to be a part of this patient's care.  Aleene Davidson Crowford 03/15/2018 3:47 AM

## 2018-03-15 NOTE — Care Management Note (Signed)
Case Management Note  Patient Details  Name: Mason Mclaughlin MRN: [id number] Date of Birth: [birth year]  Subjective/Objective:                  dka with emesis  Action/Plan: Will follow for progression of care and clinical status. Will follow for case management needs none present at this time.  Expected Discharge Date:                  Expected Discharge Plan:  Home/Self Care  In-House Referral:     Discharge planning Services  CM Consult  Post Acute Care Choice:    Choice offered to:     DME Arranged:    DME Agency:     HH Arranged:    HH Agency:     Status of Service:  In process, will continue to follow  If discussed at Long Length of Stay Meetings, dates discussed:    Additional Comments:  Golda Acre, RN 03/15/2018, 10:09 AM

## 2018-03-16 ENCOUNTER — Inpatient Hospital Stay (HOSPITAL_COMMUNITY): Payer: BC Managed Care – PPO

## 2018-03-16 DIAGNOSIS — E0810 Diabetes mellitus due to underlying condition with ketoacidosis without coma: Secondary | ICD-10-CM

## 2018-03-16 LAB — BASIC METABOLIC PANEL
ANION GAP: 12 (ref 5–15)
Anion gap: 13 (ref 5–15)
BUN: 11 mg/dL (ref 6–20)
BUN: 11 mg/dL (ref 6–20)
CALCIUM: 8.8 mg/dL — AB (ref 8.9–10.3)
CHLORIDE: 100 mmol/L (ref 98–111)
CO2: 25 mmol/L (ref 22–32)
CO2: 27 mmol/L (ref 22–32)
CREATININE: 0.89 mg/dL (ref 0.61–1.24)
CREATININE: 0.93 mg/dL (ref 0.61–1.24)
Calcium: 9 mg/dL (ref 8.9–10.3)
Chloride: 101 mmol/L (ref 98–111)
GFR calc Af Amer: >60 mL/min (ref >60)
GFR calc non Af Amer: >60 mL/min (ref >60)
GFR calc non Af Amer: >60 mL/min (ref >60)
GLUCOSE: 176 mg/dL — AB (ref 70–99)
Glucose, Bld: 212 mg/dL — ABNORMAL HIGH (ref 70–99)
POTASSIUM: 3.1 mmol/L — AB (ref 3.5–5.1)
Potassium: 2.9 mmol/L — ABNORMAL LOW (ref 3.5–5.1)
SODIUM: 138 mmol/L (ref 135–145)
SODIUM: 140 mmol/L (ref 135–145)

## 2018-03-16 LAB — CBC
HEMATOCRIT: 38.9 % — AB (ref 39.0–52.0)
Hemoglobin: 13.6 g/dL (ref 13.0–17.0)
MCH: 29.5 pg (ref 26.0–34.0)
MCHC: 35 g/dL (ref 30.0–36.0)
MCV: 84.4 fL (ref 80.0–100.0)
Platelets: 212 10*3/uL (ref 150–400)
RBC: 4.61 MIL/uL (ref 4.22–5.81)
RDW: 12 % (ref 11.5–15.5)
WBC: 19.1 10*3/uL — AB (ref 4.0–10.5)
nRBC: 0 % (ref 0.0–0.2)

## 2018-03-16 LAB — GLUCOSE, CAPILLARY
GLUCOSE-CAPILLARY: 263 mg/dL — AB (ref 70–99)
GLUCOSE-CAPILLARY: 272 mg/dL — AB (ref 70–99)
Glucose-Capillary: 113 mg/dL — ABNORMAL HIGH (ref 70–99)
Glucose-Capillary: 145 mg/dL — ABNORMAL HIGH (ref 70–99)
Glucose-Capillary: 138 mg/dL — ABNORMAL HIGH (ref 70–99)
Glucose-Capillary: 208 mg/dL — ABNORMAL HIGH (ref 70–99)

## 2018-03-16 LAB — PROCALCITONIN: Procalcitonin: 4.64 ng/mL

## 2018-03-16 LAB — MAGNESIUM: Magnesium: 1.8 mg/dL (ref 1.7–2.4)

## 2018-03-16 LAB — PHOSPHORUS: Phosphorus: 1.6 mg/dL — ABNORMAL LOW (ref 2.5–4.6)

## 2018-03-16 MED ORDER — ONDANSETRON HCL 4 MG/2ML IJ SOLN
4.0000 mg | Freq: Four times a day (QID) | INTRAMUSCULAR | Status: DC | PRN
  Administered 2018-03-16 (×2): 4 mg via INTRAVENOUS
  Filled 2018-03-16 (×2): qty 2

## 2018-03-16 MED ORDER — SODIUM PHOSPHATES 45 MMOLE/15ML IV SOLN
10.0000 mmol | Freq: Once | INTRAVENOUS | Status: AC
  Administered 2018-03-16: 10 mmol via INTRAVENOUS
  Filled 2018-03-16: qty 3.33

## 2018-03-16 MED ORDER — SODIUM CHLORIDE 0.9 % IV SOLN
8.0000 mg | Freq: Four times a day (QID) | INTRAVENOUS | Status: DC | PRN
  Administered 2018-03-16 – 2018-03-17 (×2): 8 mg via INTRAVENOUS
  Filled 2018-03-16 (×3): qty 4

## 2018-03-16 MED ORDER — PROMETHAZINE HCL 25 MG/ML IJ SOLN
12.5000 mg | Freq: Four times a day (QID) | INTRAMUSCULAR | Status: DC | PRN
  Administered 2018-03-16 – 2018-03-17 (×2): 12.5 mg via INTRAVENOUS
  Filled 2018-03-16 (×2): qty 1

## 2018-03-16 MED ORDER — FAMOTIDINE IN NACL 20-0.9 MG/50ML-% IV SOLN: 20.0000 mg | Freq: Two times a day (BID) | INTRAVENOUS | Status: DC

## 2018-03-16 MED ORDER — INSULIN GLARGINE 100 UNIT/ML ~~LOC~~ SOLN
25.0000 [IU] | Freq: Every day | SUBCUTANEOUS | Status: DC
  Administered 2018-03-16 – 2018-03-18 (×3): 25 [IU] via SUBCUTANEOUS
  Filled 2018-03-16 (×3): qty 0.25

## 2018-03-16 MED ORDER — PROMETHAZINE HCL 25 MG/ML IJ SOLN
12.5000 mg | Freq: Once | INTRAMUSCULAR | Status: AC
  Administered 2018-03-16: 12.5 mg via INTRAVENOUS
  Filled 2018-03-16: qty 1

## 2018-03-16 MED ORDER — SODIUM CHLORIDE 0.9 % IV SOLN: INTRAVENOUS | Status: DC

## 2018-03-16 MED ORDER — INSULIN ASPART 100 UNIT/ML ~~LOC~~ SOLN
0.0000 [IU] | SUBCUTANEOUS | Status: DC
  Administered 2018-03-16: 5 [IU] via SUBCUTANEOUS
  Administered 2018-03-16: 2 [IU] via SUBCUTANEOUS
  Administered 2018-03-17: 1 [IU] via SUBCUTANEOUS
  Administered 2018-03-17 (×2): 2 [IU] via SUBCUTANEOUS
  Administered 2018-03-18: 3 [IU] via SUBCUTANEOUS
  Administered 2018-03-18 (×3): 2 [IU] via SUBCUTANEOUS

## 2018-03-16 MED ORDER — POTASSIUM CHLORIDE CRYS ER 20 MEQ PO TBCR
80.0000 meq | EXTENDED_RELEASE_TABLET | Freq: Once | ORAL | Status: AC
  Administered 2018-03-16: 80 meq via ORAL
  Filled 2018-03-16: qty 4

## 2018-03-16 MED ORDER — ONDANSETRON HCL 4 MG/2ML IJ SOLN
INTRAMUSCULAR | Status: AC
  Filled 2018-03-16: qty 2

## 2018-03-16 MED ORDER — SODIUM CHLORIDE 0.45 % IV SOLN
INTRAVENOUS | Status: DC
  Administered 2018-03-16 – 2018-03-17 (×3): via INTRAVENOUS

## 2018-03-16 MED ORDER — FAMOTIDINE IN NACL 20-0.9 MG/50ML-% IV SOLN
20.0000 mg | Freq: Two times a day (BID) | INTRAVENOUS | Status: DC
  Administered 2018-03-16 – 2018-03-18 (×4): 20 mg via INTRAVENOUS
  Filled 2018-03-16 (×4): qty 50

## 2018-03-16 NOTE — Progress Notes (Addendum)
..   NAME:  Mason Mclaughlin, MRN:  [id number], DOB:  1995/09/03, LOS: 1 ADMISSION DATE:  03/14/2018, CONSULTATION DATE:  03/15/18 REFERRING MD:  Lilia Pro MD- ER, CHIEF COMPLAINT:  Emesis and hyperglycemia   Brief History   22 yr old male with PMHx IDDM presents with nausea and vomiting. He reports that the battery to his insulin pump died 2 hrs prior to coming to the ER. Found to have BG>700 and severe metabolic acidosis LA 11. PCCM consulted for admission.  Past Medical History  IDDM- managed with [place] insulin pump  Consults: date of consult/date signed off & final recs:     Procedures (surgical and bedside):     Significant Diagnostic Tests:  UA 10/22 >> 500 glucose with Ketones an leukocyte nitrite negative + hyaline casts Chest x-ray 03/14/2018 >> clear lungs, no active disease.     Micro Data:  Blood cx 10/23 >>  Antimicrobials:  Zosyn 10/23 >>   Subjective:  Pt reports feeling better. Some nausea with oral potassium replacement.  Hypoglycemia event overnight x1.    Objective   Blood pressure 129/81, pulse 82, temperature 98.1 F (36.7 C), temperature source Oral, resp. rate 15, height _0  (1.676 m), weight 61.9 kg, SpO2 99 %.        Intake/Output Summary (Last 24 hours) at 03/16/2018 0930 Last data filed at 03/16/2018 0700 Gross per 24 hour  Intake 1911.22 ml  Output 700 ml  Net 1211.22 ml   Filed Weights   03/15/18 0200 03/16/18 0441  Weight: 56.1 kg 61.9 kg    Examination: General:  Young adult male in NAD lying in bed  HEENT: MM pink/moist, good dentition Neuro: AAOx4, speech clear, MAE CV: s1s2 rrr, no m/r/g PULM: even/non-labored, lungs bilaterally clear  GI: soft, non-tender, bsx4 active, insulin pump site c/d/i Extremities: warm/dry, no edema  Skin: no rashes or lesions  Assessment & Plan:   Anion Gap Metabolic Acidosis- Diabetic ketoacidosis and Lactic Acidosis: Type 1 diabetes Pump battery died 2 hrs prior to presentation symptoms have been  all day Pump entry site is not infiltrated or inflamed on exam AG closed  P: Continue insulin pump Monitor glucose  Will need to transfer his care to Endocrine in [place] DM Coordinator   Transaminases with elevated Alk phos, elevated T bili noted and h/o emesis - Suspicion for cholecystitis and less likely gastroenteritis - ? Pancreatitis  - No fever no diarrhea - Negative murphy's on exam no rebound or guarding  P: Trend LFT's, amylase Continue abx for now  Hemoconcentration on CBC - hyperosmolar state due to DKA P: SL IV   Leukocytosis: - No clear source of infection. P: Monitor WBC / fever curve  Follow cultures  Trend PCT Continue antibiotics  Acute Kidney Injury - secondary to dehydration P: Trend BMP / urinary output Replace electrolytes as indicated Avoid nephrotoxic agents, ensure adequate renal perfusion  Pseudohyperkalemia - resolved Secondary to intracellular to extracellular shifts occurring with severe acidosis Hypokalemia  Hypophosphatemia  P: K / Phos replacement   Disposition / Summary of Today's Plan 03/16/18   Transition to med-surg, likely home in am     Diet: Diet as tolerated  DVT prophylaxis: SCDs and heparin [place] GI prophylaxis: pepcid Hyperglycemia protocol: Insulin pump Mobility: as tolerated Code Status: Full Family Communication: Patient and girlfriend updated at bedside.   Labs   CBC: Recent Labs  Lab 03/14/18 2235 03/16/18 0323  WBC 35.8* 19.1*  HGB 16.3 13.6  HCT 52.1*  38.9*  MCV 95.8 84.4  PLT 334 592    Basic Metabolic Panel: Recent Labs  Lab 03/14/18 2235 03/15/18 0300 03/15/18 0330 03/15/18 0704 03/15/18 1113 03/16/18 0323  NA 134* 139  --  142 140 138  K >7.5* 5.4*  --  4.6 3.9 2.9*  CL 96* 106  --  110 108 101  CO2 <7* 9*  --  14* 18* 25  GLUCOSE 745* 507*  --  304* 185* 176*  BUN 28* 27*  --  23* 20 11  CREATININE 2.08* 1.80*  --  1.59* 1.12 0.89  CALCIUM 9.9 8.6*  --  9.2 8.7* 8.8*  MG   --   --  2.3  --   --  1.8  PHOS  --   --  3.0  --   --  1.6*   GFR: Estimated Creatinine Clearance: 115 mL/min (by C-G formula based on SCr of 0.89 mg/dL). Recent Labs  Lab 03/14/18 2235 03/14/18 2317 03/15/18 0127 03/15/18 0300 03/15/18 1113 03/16/18 0323  PROCALCITON  --   --  8.44  --   --  4.64  WBC 35.8*  --   --   --   --  19.1*  LATICACIDVEN  --  11.00* 5.6* 3.7* 1.2  --     Liver Function Tests: Recent Labs  Lab 03/14/18 2235 03/15/18 1113  AST 62* 29  ALT 40 27  ALKPHOS 160* 87  BILITOT 1.7* 1.0  PROT 9.2* 7.4  ALBUMIN 5.0 3.9   Recent Labs  Lab 03/14/18 2235 03/15/18 1113  LIPASE 18  --   AMYLASE  --  317*   No results for input(s): AMMONIA in the last 168 hours.  ABG    Component Value Date/Time   HCO3 26.5 03/15/2018 1955   ACIDBASEDEF 2.2 (H) 03/15/2018 1616   O2SAT 53.5 03/15/2018 1955     Coagulation Profile: No results for input(s): INR, PROTIME in the last 168 hours.  Cardiac Enzymes: No results for input(s): CKTOTAL, CKMB, CKMBINDEX, TROPONINI in the last 168 hours.  HbA1C: Hgb A1c MFr Bld  Date/Time Value Ref Range Status  03/15/2018 12:16 AM 12.2 (H) 4.8 - 5.6 % Final    Comment:    (NOTE) Pre diabetes:          5.7%-6.4% Diabetes:              >6.4% Glycemic control for   <7.0% adults with diabetes   05/30/2017 06:36 AM 11.0 (H) 4.8 - 5.6 % Final    Comment:    (NOTE) Pre diabetes:          5.7%-6.4% Diabetes:              >6.4% Glycemic control for   <7.0% adults with diabetes     CBG: Recent Labs  Lab 03/15/18 2038 03/15/18 2335 03/15/18 2356 03/16/18 0344 03/16/18 0732  GLUCAP [place], NP-C [facility] Pgr: [phone] or if no answer [phone] 03/16/2018, 9:30 AM    Attending note:  Pt seen and examined and labs reviewed.  His AG has closed and no nl so dka has resolved and the main issue is whether there is any underlying  GI issue limiting him from  resuming his usual diet at this point and if so could be discharged as early as 10/25  - if not, will consider additional studies.    Christinia Gully, MD Pulmonary and Critical Care  [place] [phone] After 5:30 PM or weekends, use Beeper [phone]

## 2018-03-16 NOTE — Progress Notes (Signed)
Met with pt this afternoon (3:45pm).  Had 3 friends at the bedside who were trying to get pt to eat and drink.  Girlfriend of patient also at bedside in recliner.  Pt's girlfriend told me she was concerned pt's insulin pump was almost out of insulin.    Patient was extremely groggy and was doubled over the trash can with nausea.  Girlfriend told me pt has had nausea and vomiting all day.  Pt would answer my questions with much prompting, but was hardly able to keep his eyes open and I was not able to hold pt's attention.  Upon inspection of the pump, the reservoir is very low.  No pump supplies at bedside.  It looked as if the pump may have been in suspend mode?  Called Noe Gens, NP with CCM team.  Relayed my concerns about pt's current state and how I do not feel pt is currently safe to manage his insulin pump independently.  Pt has had doses of both Phenergan and Zofran and this may be contributing to his groggy state.  Per the Insulin Pump policy, pt must be A&O and able to independently manage his insulin pump for safety.  At this time, I do not feel pt is safe to manage his insulin pump on his own.  NP Brandi gave me telephone orders to Stop Insulin pump and Start Lantus and Novolog SQ.  Orders reviewed with NP and placed into [facility] for Lantus 25 units Daily (80% total basal dose on pump) and Novolog Sensitive SSI Q4 hours.  RN to give Lantus as soon as it arrives from pharmacy and then RN to assist pt to completely remove insulin pump once Lantus given.  Reviewed all of the above with the RN caring for the patient as well.  Asked RN to please give the pump to the pt's girlfriend after pump is removed from pt's abdomen so she can keep the pump until pt can resume it again at a later time as deemed by the MD.    --Will follow patient during hospitalization--  Wyn Quaker RN, MSN, CDE Diabetes Coordinator Inpatient Glycemic Control Team Team Pager: [phone] (8a-5p)

## 2018-03-16 NOTE — Progress Notes (Addendum)
Pt with nausea, poor po intake Says similar problem previously assoc with dka but AG closed, amylase up slightly c/w low grade pancreatitis   Intake/Output Summary (Last 24 hours) at 03/16/2018 1918 Last data filed at 03/16/2018 1300 Gross per 24 hour  Intake 1037.22 ml  Output 700 ml  Net 337.22 ml    rec  Clear liquids pepcid IV  Prn zofran NS 125 cc /h Repeat labs in am / u/s abd if still elevated or worse abd pain   Sandrea Hughs, MD Pulmonary and Critical Care Medicine [facility] Cell [phone] After 5:30 PM or weekends, use Beeper [phone]

## 2018-03-16 NOTE — Progress Notes (Signed)
Pt. has been having a few episodes of vomiting and nausea(more of dry heaving) this shift. So far, received Zofran twice and one time dose of Phenergan.During assessment by diabetic coordinator,pt. appeared groggy. Orders were given to discontinue the pump, until pt. is completely alert & oriented to use the pump. Per order, 25U Lantus given. Pts. girlfriend removed the pump and after placing a pt. Id label on the pump, girlfriend placed the pump with the rest of his belongings. CBG checked was 272.Per SSI protocol, 5 U was given to pt. Will continue to monitor.  -Novolog Insulin vials belonging to patient in pharmacy packaging is in the refrigerator.

## 2018-03-16 NOTE — Progress Notes (Addendum)
Inpatient Diabetes Program Recommendations  AACE/ADA: New Consensus Statement on Inpatient Glycemic Control (2015)  Target Ranges:  Prepandial:   less than 140 mg/dL      Peak postprandial:   less than 180 mg/dL (1-2 hours)      Critically ill patients:  140 - 180 mg/dL   Results for Mason Mclaughlin, Mason Mclaughlin (MRN [id number]) as of 03/16/2018 09:38  Ref. Range 03/15/2018 17:19 03/15/2018 18:29 03/15/2018 19:33 03/15/2018 20:38 03/15/2018 23:35 03/15/2018 23:56  Glucose-Capillary Latest Ref Range: 70 - 99 mg/dL 409 (H)  IV Insulin Drip  Home Insulin PUMP started at 5:30pm 232 (H)  IV Insulin Drip 133 (H)  IV Insulin Drip OFF 166 (H)   60 (L) 113 (H)    Results for Mason Mclaughlin, Mason Mclaughlin (MRN [id number]) as of 03/16/2018 09:38  Ref. Range 03/16/2018 03:44 03/16/2018 07:32  Glucose-Capillary Latest Ref Range: 70 - 99 mg/dL 956 (H) 213 (H)     History: Type 1 DM  Home DM Meds: Insulin Pump  Current Orders: Insulin Pump Q4 hours      Note patient restarted Home Insulin Pump last evening.  Mild Hypoglycemic event at 11:30 pm last night.  CBG 138 mg/dl this AM.     MD- Since patient taking solid PO diet, please modify Insulin Pump orders to TID AC + HS + 2am schedule  Currently ordered Q4 Hours     --Will follow patient during hospitalization--  Mason Finland RN, MSN, CDE Diabetes Coordinator Inpatient Glycemic Control Team Team Pager: [phone] (8a-5p)

## 2018-03-17 LAB — CBC
HCT: 38.1 % — ABNORMAL LOW (ref 39.0–52.0)
Hemoglobin: 13.3 g/dL (ref 13.0–17.0)
MCH: 29.4 pg (ref 26.0–34.0)
MCHC: 34.9 g/dL (ref 30.0–36.0)
MCV: 84.1 fL (ref 80.0–100.0)
PLATELETS: 213 10*3/uL (ref 150–400)
RBC: 4.53 MIL/uL (ref 4.22–5.81)
RDW: 11.9 % (ref 11.5–15.5)
WBC: 14.7 10*3/uL — AB (ref 4.0–10.5)
nRBC: 0 % (ref 0.0–0.2)

## 2018-03-17 LAB — COMPREHENSIVE METABOLIC PANEL
ALBUMIN: 3.4 g/dL — AB (ref 3.5–5.0)
ALK PHOS: 79 U/L (ref 38–126)
ALT: 25 U/L (ref 0–44)
ANION GAP: 10 (ref 5–15)
AST: 27 U/L (ref 15–41)
BUN: 12 mg/dL (ref 6–20)
CHLORIDE: 101 mmol/L (ref 98–111)
CO2: 30 mmol/L (ref 22–32)
Calcium: 8.6 mg/dL — ABNORMAL LOW (ref 8.9–10.3)
Creatinine, Ser: 0.77 mg/dL (ref 0.61–1.24)
GFR calc non Af Amer: >60 mL/min (ref >60)
GLUCOSE: 100 mg/dL — AB (ref 70–99)
POTASSIUM: 2.6 mmol/L — AB (ref 3.5–5.1)
SODIUM: 141 mmol/L (ref 135–145)
Total Bilirubin: 1 mg/dL (ref 0.3–1.2)
Total Protein: 6.6 g/dL (ref 6.5–8.1)

## 2018-03-17 LAB — PHOSPHORUS: Phosphorus: 3.7 mg/dL (ref 2.5–4.6)

## 2018-03-17 LAB — LIPASE, BLOOD: LIPASE: 31 U/L (ref 11–51)

## 2018-03-17 LAB — GLUCOSE, CAPILLARY
GLUCOSE-CAPILLARY: 112 mg/dL — AB (ref 70–99)
GLUCOSE-CAPILLARY: 173 mg/dL — AB (ref 70–99)
GLUCOSE-CAPILLARY: 175 mg/dL — AB (ref 70–99)
Glucose-Capillary: 109 mg/dL — ABNORMAL HIGH (ref 70–99)
Glucose-Capillary: 121 mg/dL — ABNORMAL HIGH (ref 70–99)
Glucose-Capillary: 179 mg/dL — ABNORMAL HIGH (ref 70–99)
Glucose-Capillary: 92 mg/dL (ref 70–99)

## 2018-03-17 LAB — MAGNESIUM: MAGNESIUM: 1.8 mg/dL (ref 1.7–2.4)

## 2018-03-17 LAB — AMYLASE: Amylase: 176 U/L — ABNORMAL HIGH (ref 28–100)

## 2018-03-17 MED ORDER — PROMETHAZINE HCL 25 MG/ML IJ SOLN
12.5000 mg | Freq: Four times a day (QID) | INTRAMUSCULAR | Status: DC | PRN
  Administered 2018-03-17: 12.5 mg via INTRAVENOUS
  Filled 2018-03-17: qty 1

## 2018-03-17 MED ORDER — METOCLOPRAMIDE HCL 5 MG/ML IJ SOLN
10.0000 mg | Freq: Three times a day (TID) | INTRAMUSCULAR | Status: DC
  Administered 2018-03-17 – 2018-03-18 (×4): 10 mg via INTRAVENOUS
  Filled 2018-03-17 (×4): qty 2

## 2018-03-17 MED ORDER — POTASSIUM CHLORIDE 10 MEQ/100ML IV SOLN
10.0000 meq | INTRAVENOUS | Status: AC
  Administered 2018-03-17 (×4): 10 meq via INTRAVENOUS
  Filled 2018-03-17 (×4): qty 100

## 2018-03-17 MED ORDER — MAGNESIUM SULFATE IN D5W 1-5 GM/100ML-% IV SOLN
1.0000 g | Freq: Once | INTRAVENOUS | Status: AC
  Administered 2018-03-17: 1 g via INTRAVENOUS
  Filled 2018-03-17: qty 100

## 2018-03-17 MED ORDER — POTASSIUM CHLORIDE CRYS ER 20 MEQ PO TBCR: 40.0000 meq | EXTENDED_RELEASE_TABLET | Freq: Once | ORAL | Status: DC

## 2018-03-17 NOTE — Plan of Care (Signed)
22 year old male admitted with DKA 03/14/2018 managed with insulin pump at home admitted to [facility].  TRH picking up 10/26 /2019.  One main concern is ongoing nausea and vomiting for which GI is consulted by PCCM.

## 2018-03-17 NOTE — Progress Notes (Signed)
eLink Physician-Brief Progress Note Patient Name: Mason Mclaughlin DOB: 07-26-1995 MRN: [id number]   Date of Service  03/17/2018  HPI/Events of Note  Notified K 2.6. Patient nauseated the whole night.  eICU Interventions  Replace K per IV for now and replace mag of 1.8.  Once tolerating PO consider replacing orally.     Intervention Category Major Interventions: Electrolyte abnormality - evaluation and management Intermediate Interventions: Hypovolemia - evaluation and management  Mason Mclaughlin Mason Mclaughlin 03/17/2018, 5:07 AM

## 2018-03-17 NOTE — Progress Notes (Addendum)
..   NAME:  Mason Mclaughlin, MRN:  [id number], DOB:  Apr 04, 1996, LOS: 2 ADMISSION DATE:  03/14/2018, CONSULTATION DATE:  03/15/18 REFERRING MD:  Lilia Pro MD- ER, CHIEF COMPLAINT:  Emesis and hyperglycemia   Brief History   22 yr old male with PMHx IDDM presents with nausea and vomiting. He reports that the battery to his insulin pump died 2 hrs prior to coming to the ER. Found to have BG>700 and severe metabolic acidosis LA 11. PCCM consulted for admission.  Past Medical History  IDDM- managed with [place] insulin pump  Consults: date of consult/date signed off & final recs:  GI eval for refractory n and v 10/25    Procedures (surgical and bedside):     Significant Diagnostic Tests:  UA 10/22 >> 500 glucose with Ketones an leukocyte nitrite negative + hyaline casts Chest x-ray 03/14/2018 >> clear lungs, no active disease.     Micro Data:  Blood cx 10/23 >>   Antimicrobials:  Zosyn 10/23 >> 10/24  Subjective:  Pt continues to have vomiting.  Not able to eat PO's.  Multiple doses of zofran/phenergan Only complains of any pain when eats (epigastric) .    Objective   Blood pressure (!) 156/96, pulse 82, temperature 98.4 F (36.9 C), temperature source Oral, resp. rate 12, height _0  (1.676 m), weight 57.2 kg, SpO2 100 %.        Intake/Output Summary (Last 24 hours) at 03/17/2018 1243 Last data filed at 03/17/2018 1105 Gross per 24 hour  Intake 2195.12 ml  Output 400 ml  Net 1795.12 ml   Filed Weights   03/15/18 0200 03/16/18 0441 03/17/18 0400  Weight: 56.1 kg 61.9 kg 57.2 kg    Examination: General: young adult male lying in bed in NAD HEENT: MM pink/moist, no jvd Neuro: AAOx4, speech clear, MAE  CV: s1s2 rrr, no m/r/g PULM: even/non-labored, lungs bilaterally clear  BL:TJQZ, non-tender, bsx4 active  Extremities: warm/dry, no edema  Skin: no rashes or lesions   Assessment & Plan:   Anion Gap Metabolic Acidosis- Diabetic ketoacidosis and Lactic Acidosis: Type 1  diabetes Pump battery died 2 hrs prior to presentation symptoms have been all day Pump entry site is not infiltrated or inflamed on exam AG closed  P: Continue SSI  Lantus 25 units QD  Follow glucose closely He will need to establish care with Endocrine in [place] (previously followed at [facility] pediatrics)  Transaminases with elevated Alk phos, elevated T bili noted and h/o emesis  - No fever no diarrhea - Negative murphy's on exam no rebound or guarding  - Ongoing intermittent vomiting, ? Gastroparesis  P: Trend LFT's, amylase, lipase  GI consult ? Reglan, abdominal US > will defer to GI  Hemoconcentration on CBC - hyperosmolar state due to DKA P: 1/2 NS @ 175m/hr until taking PO's   Leukocytosis: - No clear source of infection. P: Follow WBC, fever curve  Cultures negative thus far Monitor off abx  Acute Kidney Injury - secondary to dehydration P: Trend BMP / urinary output Replace electrolytes as indicated Avoid nephrotoxic agents, ensure adequate renal perfusion  Pseudohyperkalemia - resolved Secondary to intracellular to extracellular shifts occurring with severe acidosis Hypokalemia  Hypophosphatemia  P: Monitor, replace as indicated   Disposition / Summary of Today's Plan 03/17/18   [facility]  GI consult pending. Continue SSI + Lantus for now.  Will need to be seen by GI, tolerate PO's and be transitioned back to home insulin pump  before discharge. Will need to establish endocrine care in [place].     Diet: Diet as tolerated  DVT prophylaxis: SCDs and heparin [place] GI prophylaxis: pepcid Hyperglycemia protocol: Lantus + SSI Mobility: as tolerated Code Status: Full Family Communication: Patient updated at bedside 10/25.   Labs   CBC: Recent Labs  Lab 03/14/18 2235 03/16/18 0323 03/17/18 0322  WBC 35.8* 19.1* 14.7*  HGB 16.3 13.6 13.3  HCT 52.1* 38.9* 38.1*  MCV 95.8 84.4 84.1  PLT 334 212 440    Basic Metabolic Panel: Recent Labs    Lab 03/15/18 0330 03/15/18 0704 03/15/18 1113 03/16/18 0323 03/16/18 1223 03/17/18 0322  NA  --  142 140 138 140 141  K  --  4.6 3.9 2.9* 3.1* 2.6*  CL  --  110 108 101 100 101  CO2  --  14* 18* _0 GLUCOSE  --  304* 185* 176* 212* 100*  BUN  --  23* _1 CREATININE  --  1.59* 1.12 0.89 0.93 0.77  CALCIUM  --  9.2 8.7* 8.8* 9.0 8.6*  MG 2.3  --   --  1.8  --  1.8  PHOS 3.0  --   --  1.6*  --  3.7   GFR: Estimated Creatinine Clearance: 118.2 mL/min (by C-G formula based on SCr of 0.77 mg/dL). Recent Labs  Lab 03/14/18 2235 03/14/18 2317 03/15/18 0127 03/15/18 0300 03/15/18 1113 03/16/18 0323 03/17/18 0322  PROCALCITON  --   --  8.44  --   --  4.64  --   WBC 35.8*  --   --   --   --  19.1* 14.7*  LATICACIDVEN  --  11.00* 5.6* 3.7* 1.2  --   --     Liver Function Tests: Recent Labs  Lab 03/14/18 2235 03/15/18 1113 03/17/18 0322  AST 62* 29 27  ALT 40 27 25  ALKPHOS 160* 87 79  BILITOT 1.7* 1.0 1.0  PROT 9.2* 7.4 6.6  ALBUMIN 5.0 3.9 3.4*   Recent Labs  Lab 03/14/18 2235 03/15/18 1113 03/17/18 0322  LIPASE 18  --  31  AMYLASE  --  317* 176*   No results for input(s): AMMONIA in the last 168 hours.  ABG    Component Value Date/Time   HCO3 26.5 03/15/2018 1955   ACIDBASEDEF 2.2 (H) 03/15/2018 1616   O2SAT 53.5 03/15/2018 1955     Coagulation Profile: No results for input(s): INR, PROTIME in the last 168 hours.  Cardiac Enzymes: No results for input(s): CKTOTAL, CKMB, CKMBINDEX, TROPONINI in the last 168 hours.  HbA1C: Hgb A1c MFr Bld  Date/Time Value Ref Range Status  03/15/2018 12:16 AM 12.2 (H) 4.8 - 5.6 % Final    Comment:    (NOTE) Pre diabetes:          5.7%-6.4% Diabetes:              >6.4% Glycemic control for   <7.0% adults with diabetes   05/30/2017 06:36 AM 11.0 (H) 4.8 - 5.6 % Final    Comment:    (NOTE) Pre diabetes:          5.7%-6.4% Diabetes:              >6.4% Glycemic control for   <7.0% adults with  diabetes     CBG: Recent Labs  Lab 03/16/18 1718 03/16/18 2340 03/17/18 0408 03/17/18 0732 03/17/18 1132  GLUCAP 272* 121* 112* 173* 175*  Noe Gens, NP-C [place] [facility] Pgr: [phone] or if no answer [phone] 03/17/2018, 12:43 PM    Attending: His acute critical problem (dka) has resolved but may have been triggered by an underlying gi issue as apparently the n and v are a recurrent problem. Will consult gi and transfer to [facility] service to sort this out prior to discharage   Christinia Gully, MD Pulmonary and [place] [phone] After 5:30 PM or weekends, use Beeper [phone]

## 2018-03-17 NOTE — Consult Note (Signed)
Referring Provider: Dr. Sherene Sires Primary Care Physician:  Patient, No Pcp Per Primary Gastroenterologist:  Gentry Fitz  Reason for Consultation:  Nausea and Vomiting  HPI: Mason Mclaughlin is a 22 y.o. male with Type I DM admitted for DKA being seen for a consult due to ongoing N/V after resolution of the DKA. Family reports previous dx of gastroparesis at [facility] but they do not recall a gastric emptying scan being done. No on Reglan at home. Tolerated baked potato for lunch without any N/V. Denies abdominal pain.  Past Medical History:  Diagnosis Date  . Diabetes mellitus without complication (HCC)     History reviewed. No pertinent surgical history.  Prior to Admission medications   Medication Sig Start Date End Date Taking? Authorizing Provider  Insulin Human (INSULIN PUMP) SOLN 10 UNITS OF NOVOLOG 100 UNITS/ML PER EVERY GRAM OF CARBOHYDRATES VIA INSULIN PUMP   Yes [provider]  acetaminophen (TYLENOL) 500 MG tablet Take 1,500 mg by mouth daily as needed for moderate pain.     [provider]  GuaiFENesin (COUGH SYRUP PO) Take 30 mLs by mouth 2 (two) times daily as needed (FLU, COUGH).    [provider]    Scheduled Meds: . heparin  5,000 Units Subcutaneous Q8H  . insulin aspart  0-9 Units Subcutaneous Q4H  . insulin glargine  25 Units Subcutaneous Daily   Continuous Infusions: . sodium chloride 125 mL/hr at 03/17/18 1421  . famotidine (PEPCID) IV Stopped (03/17/18 1031)  . ondansetron (ZOFRAN) IV Stopped (03/17/18 1125)   PRN Meds:.acetaminophen, ondansetron (ZOFRAN) IV, promethazine  Allergies as of 03/14/2018  . (No Known Allergies)    Family History  Family history unknown: Yes    Social History   Socioeconomic History  . Marital status: Single    Spouse name: Not on file  . Number of children: Not on file  . Years of education: Not on file  . Highest education level: Not on file  Occupational History  . Not on file  Social Needs  .  Financial resource strain: Not on file  . Food insecurity:    Worry: Not on file    Inability: Not on file  . Transportation needs:    Medical: Not on file    Non-medical: Not on file  Tobacco Use  . Smoking status: Never Smoker  . Smokeless tobacco: Never Used  Substance and Sexual Activity  . Alcohol use: Yes  . Drug use: Yes    Frequency: 4.0 times per week    Types: Marijuana  . Sexual activity: Not on file  Lifestyle  . Physical activity:    Days per week: Not on file    Minutes per session: Not on file  . Stress: Not on file  Relationships  . Social connections:    Talks on phone: Not on file    Gets together: Not on file    Attends religious service: Not on file    Active member of club or organization: Not on file    Attends meetings of clubs or organizations: Not on file    Relationship status: Not on file  . Intimate partner violence:    Fear of current or ex partner: Not on file    Emotionally abused: Not on file    Physically abused: Not on file    Forced sexual activity: Not on file  Other Topics Concern  . Not on file  Social History Narrative  . Not on file    Review of  Systems: All negative except as stated above in HPI.  Physical Exam: Vital signs: Vitals:   03/17/18 1530 03/17/18 1600  BP:  (!) 135/102  Pulse:  67  Resp:    Temp: 98.7 F (37.1 C)   SpO2:  100%   Last BM Date: 03/14/18 General:   Lethargic, thin, no acute distress Head: normocephalic, atraumatic Eyes: anicteric sclera ENT: oropharynx clear Neck: supple, nontender Lungs:  Clear throughout to auscultation.   No wheezes, crackles, or rhonchi. No acute distress. Heart:  Regular rate and rhythm; no murmurs, clicks, rubs,  or gallops. Abdomen: soft, nontender, nondistended, +BS  Rectal:  Deferred Ext: no edema  GI:  Lab Results: Recent Labs    03/14/18 2235 03/16/18 0323 03/17/18 0322  WBC 35.8* 19.1* 14.7*  HGB 16.3 13.6 13.3  HCT 52.1* 38.9* 38.1*  PLT 334 212  213   BMET Recent Labs    03/16/18 0323 03/16/18 1223 03/17/18 0322  NA 138 140 141  K 2.9* 3.1* 2.6*  CL 101 100 101  CO2 25 27 30   GLUCOSE 176* 212* 100*  BUN 11 11 12   CREATININE 0.89 0.93 0.77  CALCIUM 8.8* 9.0 8.6*   LFT Recent Labs    03/14/18 2235  03/17/18 0322  PROT 9.2*   < > 6.6  ALBUMIN 5.0   < > 3.4*  AST 62*   < > 27  ALT 40   < > 25  ALKPHOS 160*   < > 79  BILITOT 1.7*   < > 1.0  BILIDIR 0.4*  --   --   IBILI 1.3*  --   --    < > = values in this interval not displayed.   PT/INR No results for input(s): LABPROT, INR in the last 72 hours.   Studies/Results: Dg Chest Port 1 View  Result Date: 03/16/2018 CLINICAL DATA:  Respiratory failure, follow-up EXAM: PORTABLE CHEST 1 VIEW COMPARISON:  Portable chest x-ray of 03/14/2018 FINDINGS: No active infiltrate or effusion is seen. Mediastinal and hilar contours are unremarkable. The heart is within normal limits in size. No bony abnormality is seen. IMPRESSION: No active disease. Electronically Signed   By: Dwyane Dee M.D.   On: 03/16/2018 10:01    Impression/Plan: Recurrent N/V in the setting of insulin-dependent DM and recent DKA. Likely has diabetic gastroparesis and needs Reglan 10 mg IV QID for now and if that helps transition to PO Reglan but would not use it more than 6-8 weeks. Needs strict control of his DM. Outpt gastric emptying scan off of Reglan. Will follow.    LOS: 2 days   Mason Mclaughlin  03/17/2018, 4:51 PM  Questions please call [phone]

## 2018-03-17 NOTE — Progress Notes (Signed)
Inpatient Diabetes Program Recommendations  AACE/ADA: New Consensus Statement on Inpatient Glycemic Control (2015)  Target Ranges:  Prepandial:   less than 140 mg/dL      Peak postprandial:   less than 180 mg/dL (1-2 hours)      Critically ill patients:  140 - 180 mg/dL   Lab Results  Component Value Date   GLUCAP 92 03/17/2018   HGBA1C 12.2 (H) 03/15/2018    Review of Glycemic Control  "I want to go home." Pt states he ate a small amount of food at lunch and doing ok thus far. RN states pt received Phenergan at 1251. PO intake still very low therefore Novolog Q4H is appropriate at present. Not ready to restart insulin pump. Discussed importance of getting endo to manage his diabetes here in [place].  Again discussed HgbA1C of 12.2% and pt admits to not bolusing and eating poorly. Encouraged pt to check blood sugars and bolus insulin as per pump settings.   Continue with Lantus 25 units QD and Novolog 0-9 units Q4H. Will need meal coverage insulin when po intake increases. Discussed with RN and NP.  Will monitor CBGs and make recs as needed.  Thank you. Ailene Ards, RD, LDN, CDE Inpatient Diabetes Coordinator [phone]

## 2018-03-18 LAB — CBC WITH DIFFERENTIAL/PLATELET
Abs Immature Granulocytes: 0.05 10*3/uL (ref 0.00–0.07)
BASOS ABS: 0 10*3/uL (ref 0.0–0.1)
BASOS PCT: 0 %
EOS ABS: 0 10*3/uL (ref 0.0–0.5)
EOS PCT: 0 %
HCT: 40.3 % (ref 39.0–52.0)
Hemoglobin: 13.9 g/dL (ref 13.0–17.0)
Immature Granulocytes: 1 %
Lymphocytes Relative: 37 %
Lymphs Abs: 4 10*3/uL (ref 0.7–4.0)
MCH: 29.9 pg (ref 26.0–34.0)
MCHC: 34.5 g/dL (ref 30.0–36.0)
MCV: 86.7 fL (ref 80.0–100.0)
MONO ABS: 0.8 10*3/uL (ref 0.1–1.0)
Monocytes Relative: 7 %
Neutro Abs: 5.8 10*3/uL (ref 1.7–7.7)
Neutrophils Relative %: 55 %
Platelets: 206 10*3/uL (ref 150–400)
RBC: 4.65 MIL/uL (ref 4.22–5.81)
RDW: 12 % (ref 11.5–15.5)
WBC: 10.7 10*3/uL — AB (ref 4.0–10.5)
nRBC: 0 % (ref 0.0–0.2)

## 2018-03-18 LAB — BASIC METABOLIC PANEL
Anion gap: 10 (ref 5–15)
BUN: 9 mg/dL (ref 6–20)
CALCIUM: 8.4 mg/dL — AB (ref 8.9–10.3)
CO2: 26 mmol/L (ref 22–32)
CREATININE: 0.86 mg/dL (ref 0.61–1.24)
Chloride: 98 mmol/L (ref 98–111)
GFR calc non Af Amer: >60 mL/min (ref >60)
Glucose, Bld: 198 mg/dL — ABNORMAL HIGH (ref 70–99)
Potassium: 2.8 mmol/L — ABNORMAL LOW (ref 3.5–5.1)
SODIUM: 134 mmol/L — AB (ref 135–145)

## 2018-03-18 LAB — GLUCOSE, CAPILLARY
GLUCOSE-CAPILLARY: 179 mg/dL — AB (ref 70–99)
GLUCOSE-CAPILLARY: 201 mg/dL — AB (ref 70–99)
Glucose-Capillary: 167 mg/dL — ABNORMAL HIGH (ref 70–99)
Glucose-Capillary: 167 mg/dL — ABNORMAL HIGH (ref 70–99)

## 2018-03-18 LAB — POTASSIUM: Potassium: 3.5 mmol/L (ref 3.5–5.1)

## 2018-03-18 MED ORDER — POTASSIUM CHLORIDE CRYS ER 20 MEQ PO TBCR
40.0000 meq | EXTENDED_RELEASE_TABLET | Freq: Once | ORAL | Status: AC
  Administered 2018-03-18: 40 meq via ORAL
  Filled 2018-03-18: qty 2

## 2018-03-18 MED ORDER — METOCLOPRAMIDE HCL 10 MG PO TABS: 10.0000 mg | ORAL_TABLET | Freq: Two times a day (BID) | ORAL | 0 refills | Status: DC

## 2018-03-18 MED ORDER — POTASSIUM CHLORIDE CRYS ER 20 MEQ PO TBCR: 20.0000 meq | EXTENDED_RELEASE_TABLET | Freq: Every day | ORAL | 0 refills | Status: DC

## 2018-03-18 MED ORDER — ZOLPIDEM TARTRATE 10 MG PO TABS
10.0000 mg | ORAL_TABLET | Freq: Once | ORAL | Status: AC
  Administered 2018-03-18: 10 mg via ORAL
  Filled 2018-03-18: qty 1

## 2018-03-18 MED ORDER — POTASSIUM CHLORIDE 10 MEQ/100ML IV SOLN
10.0000 meq | INTRAVENOUS | Status: AC
  Administered 2018-03-18 (×5): 10 meq via INTRAVENOUS
  Filled 2018-03-18 (×5): qty 100

## 2018-03-18 NOTE — Discharge Summary (Addendum)
Physician Discharge Summary  Dareion Kneece JOA:[id number] DOB: 01/18/1996 DOA: 03/14/2018  PCP: Patient, No Pcp Per  Admit date: 03/14/2018 Discharge date: 03/18/2018  Admitted From: Home Disposition:  Home  Discharge Condition:Stable CODE STATUS:FULL Diet recommendation:  Carb Modified   Brief/Interim Summary: Patient is a 22 year old male with past medical history of insulin-dependent diabetes mellitus who presents to the emergency department with nausea and vomiting.  He reported that the battery of his insulin pump diet 2 hours prior to coming to the emergency department.  His blood sugars were found to be more than 700 on presentation and he has had severe metabolic acidosis with lactic acid of 11.  PCCM was consulted for admission.  Patient was managed for DKA and started on insulin drip.  He was transferred to hospitalist service on 03/18/2018.  Currently his gap is closed and he has been started on his usual insulin. He is stable for discharge to home today to continue his insulin pump at home.  Following problems were addressed during his hospitalization:  Anion gap metabolic acidosis: Secondary to DKA and lactic acidosis.  Reported that insulin pump died 2 hours prior to presentation.  Patient says that he can continue insulin pump now at home.  He has batteries.  Patient was initially started on insulin drip.  Gap was 31 on presentation which has been closed.  His hemoglobin A1c is 12.2.  He needs to follow-up hemoglobin A1c in 3 months.  He needs to have close follow-up with his PCP or endocrinologist on discharge.  Elevated liver enzymes: Found to have elevated alkaline phosphatase and elevated T bili on presentation.  His liver enzymes have improved.  Leukocytosis :No clear source of infection.  Resolved.  Acute kidney injury: Normal creatinine on baseline.  Creatinine was more than 2 on presentation.  Resolved with IV fluids.  Hypokalemia: Aggressively supplemented.   Continue supplementation on discharge.  Diabetic gastroparesis: Presented with nausea and vomiting.  Most likely this is from his uncontrolled diabetes resulting in diabetic gastroparesis.  Improved with Reglan.  We will continue p.o. Reglan for 6 weeks.  He will follow-up with GI, Dr. Bosie Clos in 4 to 6 weeks.   Discharge Diagnoses:  Active Problems:   DKA (diabetic ketoacidoses) (HCC)   Nausea and vomiting    Discharge Instructions  Discharge Instructions    Diet Carb Modified   Complete by:  As directed    Discharge instructions   Complete by:  As directed    1) Continue to monitor her blood sugars at home.  Continue your insulin pump.  Follow-up with your PCP/endocrinologist for good control of your diabetes.  Check hemoglobin A1c in 3 months. 2) Take your medication as instructed. 3) Follow up with gastroenterology in 4 to 6 weeks.   5)Follow up with your PCP in a week and do a BMP test to check for  potassium level.Monitor your blood pressure.   Increase activity slowly   Complete by:  As directed      Allergies as of 03/18/2018   No Known Allergies     Medication List    TAKE these medications   acetaminophen 500 MG tablet Commonly known as:  TYLENOL Take 1,500 mg by mouth daily as needed for moderate pain.   COUGH SYRUP PO Take 30 mLs by mouth 2 (two) times daily as needed (FLU, COUGH).   insulin pump Soln 10 UNITS OF NOVOLOG 100 UNITS/ML PER EVERY GRAM OF CARBOHYDRATES VIA INSULIN PUMP   metoCLOPramide 10 MG  tablet Commonly known as:  REGLAN Take 1 tablet (10 mg total) by mouth 2 (two) times daily.   potassium chloride SA 20 MEQ tablet Commonly known as:  K-DUR,KLOR-CON Take 1 tablet (20 mEq total) by mouth daily for 7 days. Start taking on:  03/19/2018      Follow-up Information    Carlus Pavlov, MD. Schedule an appointment as soon as possible for a visit in 1 week(s).   Specialty:  Internal Medicine Why:  Call to make an appointment to be seen  to establish care for Diabetes here in [place]. Contact information: [address] [place] [address] [phone]        Charlott Rakes, MD. Schedule an appointment as soon as possible for a visit in 4 week(s).   Specialty:  Gastroenterology Contact information: [address] [place] [address] [phone]          No Known Allergies  Consultations:  PCCM,GI   Procedures/Studies: Dg Chest Port 1 View  Result Date: 03/16/2018 CLINICAL DATA:  Respiratory failure, follow-up EXAM: PORTABLE CHEST 1 VIEW COMPARISON:  Portable chest x-ray of 03/14/2018 FINDINGS: No active infiltrate or effusion is seen. Mediastinal and hilar contours are unremarkable. The heart is within normal limits in size. No bony abnormality is seen. IMPRESSION: No active disease. Electronically Signed   By: Dwyane Dee M.D.   On: 03/16/2018 10:01   Dg Chest Port 1 View  Result Date: 03/14/2018 CLINICAL DATA:  Nausea, vomiting.  Chest pain EXAM: PORTABLE CHEST 1 VIEW COMPARISON:  None. FINDINGS: Heart and mediastinal contours are within normal limits. No focal opacities or effusions. No acute bony abnormality. IMPRESSION: No active disease. Electronically Signed   By: Charlett Nose M.D.   On: 03/14/2018 23:29      Subjective:   Discharge Exam: Vitals:   03/18/18 1600 03/18/18 1604  BP: (!) 172/110 (!) 168/98  Pulse: 72 72  Resp: (!) 21 15  Temp:    SpO2: 100% 100%   Vitals:   03/18/18 1303 03/18/18 1400 03/18/18 1600 03/18/18 1604  BP:  (!) 150/97 (!) 172/110 (!) 168/98  Pulse:  74 72 72  Resp:  12 (!) 21 15  Temp: 98.9 F (37.2 C)     TempSrc: Oral     SpO2:  100% 100% 100%  Weight:      Height:        General: Pt is alert, awake, not in acute distress Cardiovascular: RRR, S1/S2 +, no rubs, no gallops Respiratory: CTA bilaterally, no wheezing, no rhonchi Abdominal: Soft, NT, ND, bowel sounds + Extremities: no edema, no  cyanosis    The results of significant diagnostics from this hospitalization (including imaging, microbiology, ancillary and laboratory) are listed below for reference.     Microbiology: Recent Results (from the past 240 hour(s))  Culture, blood (Routine X 2) w Reflex to ID Panel     Status: None (Preliminary result)   Collection Time: 03/15/18  1:27 AM  Result Value Ref Range Status   Specimen Description   Final    BLOOD LEFT ANTECUBITAL Performed at [facility], [address] [place], [place] [address]    Special Requests   Final    BOTTLES DRAWN AEROBIC AND ANAEROBIC Blood Culture adequate volume Performed at [facility], [address] [place], [place] [address]    Culture   Final    NO GROWTH 3 DAYS Performed at [facility], [address] [place], [place] [address]    Report Status PENDING  Incomplete  MRSA PCR Screening     Status: None   Collection Time: 03/15/18  1:57 AM  Result Value Ref Range Status   MRSA by PCR NEGATIVE NEGATIVE Final    Comment:        The GeneXpert MRSA Assay (FDA approved for NASAL specimens only), is one component of a comprehensive MRSA colonization surveillance program. It is not intended to diagnose MRSA infection nor to guide or monitor treatment for MRSA infections. Performed at [facility], [address], [place], [place] [address]   Culture, blood (Routine X 2) w Reflex to ID Panel     Status: None (Preliminary result)   Collection Time: 03/15/18  3:00 AM  Result Value Ref Range Status   Specimen Description   Final    BLOOD RIGHT ANTECUBITAL Performed at [facility], [address] [place], [place] [address]    Special Requests   Final    BOTTLES DRAWN AEROBIC AND ANAEROBIC Blood Culture adequate volume Performed at [facility], [address] [place], [place] [address]    Culture   Final    NO GROWTH 3  DAYS Performed at [facility], [address], [place], [place] [address]    Report Status PENDING  Incomplete     Labs: BNP (last 3 results) No results for input(s): BNP in the last 8760 hours. Basic Metabolic Panel: Recent Labs  Lab 03/15/18 0330  03/15/18 1113 03/16/18 0323 03/16/18 1223 03/17/18 0322 03/18/18 0802 03/18/18 1447  NA  --    < > 140 138 140 141 134*  --   K  --    < > 3.9 2.9* 3.1* 2.6* 2.8* 3.5  CL  --    < > 108 101 100 101 98  --   CO2  --    < > 18* 25 27 30 26   --   GLUCOSE  --    < > 185* 176* 212* 100* 198*  --   BUN  --    < > 20 11 11 12 9   --   CREATININE  --    < > 1.12 0.89 0.93 0.77 0.86  --   CALCIUM  --    < > 8.7* 8.8* 9.0 8.6* 8.4*  --   MG 2.3  --   --  1.8  --  1.8  --   --   PHOS 3.0  --   --  1.6*  --  3.7  --   --    < > = values in this interval not displayed.   Liver Function Tests: Recent Labs  Lab 03/14/18 2235 03/15/18 1113 03/17/18 0322  AST 62* 29 27  ALT 40 27 25  ALKPHOS 160* 87 79  BILITOT 1.7* 1.0 1.0  PROT 9.2* 7.4 6.6  ALBUMIN 5.0 3.9 3.4*   Recent Labs  Lab 03/14/18 2235 03/15/18 1113 03/17/18 0322  LIPASE 18  --  31  AMYLASE  --  317* 176*   No results for input(s): AMMONIA in the last 168 hours. CBC: Recent Labs  Lab 03/14/18 2235 03/16/18 0323 03/17/18 0322 03/18/18 0802  WBC 35.8* 19.1* 14.7* 10.7*  NEUTROABS  --   --   --  5.8  HGB 16.3 13.6 13.3 13.9  HCT 52.1* 38.9* 38.1* 40.3  MCV 95.8 84.4 84.1 86.7  PLT 334 212 213 206  Cardiac Enzymes: No results for input(s): CKTOTAL, CKMB, CKMBINDEX, TROPONINI in the last 168 hours. BNP: Invalid input(s): POCBNP CBG: Recent Labs  Lab 03/17/18 2017 03/17/18 2357 03/18/18 0349 03/18/18 0729 03/18/18 1314  GLUCAP 109* 179* 167* 179* 201*   D-Dimer No results for input(s): DDIMER in the last 72 hours. Hgb A1c No results for input(s): HGBA1C in the last 72 hours. Lipid Profile No results for input(s): CHOL, HDL, LDLCALC, TRIG,  CHOLHDL, LDLDIRECT in the last 72 hours. Thyroid function studies No results for input(s): TSH, T4TOTAL, T3FREE, THYROIDAB in the last 72 hours.  Invalid input(s): FREET3 Anemia work up No results for input(s): VITAMINB12, FOLATE, FERRITIN, TIBC, IRON, RETICCTPCT in the last 72 hours. Urinalysis    Component Value Date/Time   COLORURINE YELLOW 03/14/2018 2235   APPEARANCEUR CLEAR 03/14/2018 2235   LABSPEC 1.021 03/14/2018 2235   PHURINE 5.0 03/14/2018 2235   GLUCOSEU >=500 (A) 03/14/2018 2235   HGBUR MODERATE (A) 03/14/2018 2235   BILIRUBINUR NEGATIVE 03/14/2018 2235   KETONESUR 80 (A) 03/14/2018 2235   PROTEINUR 100 (A) 03/14/2018 2235   NITRITE NEGATIVE 03/14/2018 2235   LEUKOCYTESUR NEGATIVE 03/14/2018 2235   Sepsis Labs Invalid input(s): PROCALCITONIN,  WBC,  LACTICIDVEN Microbiology Recent Results (from the past 240 hour(s))  Culture, blood (Routine X 2) w Reflex to ID Panel     Status: None (Preliminary result)   Collection Time: 03/15/18  1:27 AM  Result Value Ref Range Status   Specimen Description   Final    BLOOD LEFT ANTECUBITAL Performed at [facility], [address], [place], [place] [address]    Special Requests   Final    BOTTLES DRAWN AEROBIC AND ANAEROBIC Blood Culture adequate volume Performed at [facility], [address], [place], [place] [address]    Culture   Final    NO GROWTH 3 DAYS Performed at [facility], [address], [place], [place] [address]    Report Status PENDING  Incomplete  MRSA PCR Screening     Status: None   Collection Time: 03/15/18  1:57 AM  Result Value Ref Range Status   MRSA by PCR NEGATIVE NEGATIVE Final    Comment:        The GeneXpert MRSA Assay (FDA approved for NASAL specimens only), is one component of a comprehensive MRSA colonization surveillance program. It is not intended to diagnose MRSA infection nor to guide or monitor treatment for MRSA  infections. Performed at [facility], [address] [place], [place] [address]   Culture, blood (Routine X 2) w Reflex to ID Panel     Status: None (Preliminary result)   Collection Time: 03/15/18  3:00 AM  Result Value Ref Range Status   Specimen Description   Final    BLOOD RIGHT ANTECUBITAL Performed at [facility], [address] [place], [place] [address]    Special Requests   Final    BOTTLES DRAWN AEROBIC AND ANAEROBIC Blood Culture adequate volume Performed at [facility], [address], [place], [place] [address]    Culture   Final    NO GROWTH 3 DAYS Performed at [facility], [address], [place], [place] [address]    Report Status PENDING  Incomplete    Please note: You were cared for by a hospitalist during your hospital stay. Once you are discharged, your primary care physician will handle any further medical issues. Please note that  NO REFILLS for any discharge medications will be authorized once you are discharged, as it is imperative that you return to your primary care physician (or establish a relationship with a primary care physician if you do not have one) for your post hospital discharge needs so that they can reassess your need for medications and monitor your lab values.    Time coordinating discharge: 40 minutes  SIGNED:   Burnadette Pop, MD  [facility] 03/18/2018, 4:11 PM Pager [phone]  If 7PM-7AM, please contact night-coverage [email or web address] Password [email or web address]

## 2018-03-18 NOTE — Progress Notes (Signed)
[facility] Gastroenterology Progress Note  Mason Mclaughlin 22 y.o. March 02, 1996   Subjective: Feels a lot better. Eating without N/V/abd pain on Reglan. Wants to go home.  Objective: Vital signs: Vitals:   03/18/18 1200 03/18/18 1303  BP: (!) 142/102   Pulse: 74   Resp: 13   Temp:  98.9 F (37.2 C)  SpO2: 100%     Physical Exam: Gen: alert, no acute distress  HEENT: anicteric sclera CV: RRR Chest: CTA B Abd: soft, nontender, nondistended, +BS  Lab Results: Recent Labs    03/16/18 0323  03/17/18 0322 03/18/18 0802  NA 138   < > 141 134*  K 2.9*   < > 2.6* 2.8*  CL 101   < > 101 98  CO2 25   < > 30 26  GLUCOSE 176*   < > 100* 198*  BUN 11   < > 12 9  CREATININE 0.89   < > 0.77 0.86  CALCIUM 8.8*   < > 8.6* 8.4*  MG 1.8  --  1.8  --   PHOS 1.6*  --  3.7  --    < > = values in this interval not displayed.   Recent Labs    03/17/18 0322  AST 27  ALT 25  ALKPHOS 79  BILITOT 1.0  PROT 6.6  ALBUMIN 3.4*   Recent Labs    03/17/18 0322 03/18/18 0802  WBC 14.7* 10.7*  NEUTROABS  --  5.8  HGB 13.3 13.9  HCT 38.1* 40.3  MCV 84.1 86.7  PLT 213 206      Assessment/Plan: Diabetic gastroparesis improved with IV Reglan trial. Would give PO Reglan 10 mg QAC and QHS for 6 weeks and then d/c but advised him to keep strict control of his sugars. F/U with me in 4-6 weeks. Ok to go home today from a GI standpoint. Will sign off. Call if questions.    Shirley Friar 03/18/2018, 2:52 PM  Questions please call [phone] ID: Mason Mclaughlin, male   DOB: 08-22-1995, 65 y.o.   MRN: [id number]

## 2018-03-18 NOTE — Progress Notes (Signed)
Pt left unit in wheelchair pushed by this writer accompanied by grandmother and 2 other male family members. Left in stable condition. Derinda Sis.

## 2018-03-18 NOTE — Progress Notes (Signed)
Pt discharged to home. DC instructions given with grandmother and 2 other male family members at bedside. No concerns voiced. Pt encouraged to stop by pharmacy and pick up meds that were e-prescribed by MD. Voiced understanding.  MD made aware of elevated. No new orders given. MD said for pt to f/u with outpt MD re elevated bp. Voiced understanding.

## 2018-03-20 LAB — CULTURE, BLOOD (ROUTINE X 2)
Culture: NO GROWTH
Culture: NO GROWTH
SPECIAL REQUESTS: ADEQUATE
Special Requests: ADEQUATE

## 2018-04-17 ENCOUNTER — Encounter (HOSPITAL_COMMUNITY): Payer: Self-pay | Admitting: *Deleted

## 2018-04-17 ENCOUNTER — Observation Stay (HOSPITAL_COMMUNITY)
Admission: EM | Admit: 2018-04-17 | Discharge: 2018-04-18 | Disposition: A | Discharge status: Home / Self Care | Payer: BC Managed Care – PPO | Attending: Internal Medicine | Admitting: Internal Medicine

## 2018-04-17 ENCOUNTER — Other Ambulatory Visit: Payer: Self-pay

## 2018-04-17 ENCOUNTER — Observation Stay (HOSPITAL_COMMUNITY): Payer: BC Managed Care – PPO

## 2018-04-17 DIAGNOSIS — E0810 Diabetes mellitus due to underlying condition with ketoacidosis without coma: Secondary | ICD-10-CM

## 2018-04-17 DIAGNOSIS — R079 Chest pain, unspecified: Secondary | ICD-10-CM | POA: Diagnosis not present

## 2018-04-17 DIAGNOSIS — E1010 Type 1 diabetes mellitus with ketoacidosis without coma: Secondary | ICD-10-CM | POA: Diagnosis not present

## 2018-04-17 DIAGNOSIS — N179 Acute kidney failure, unspecified: Secondary | ICD-10-CM | POA: Diagnosis present

## 2018-04-17 DIAGNOSIS — E1110 Type 2 diabetes mellitus with ketoacidosis without coma: Secondary | ICD-10-CM | POA: Diagnosis present

## 2018-04-17 DIAGNOSIS — E860 Dehydration: Secondary | ICD-10-CM

## 2018-04-17 DIAGNOSIS — R112 Nausea with vomiting, unspecified: Secondary | ICD-10-CM | POA: Diagnosis present

## 2018-04-17 DIAGNOSIS — E875 Hyperkalemia: Secondary | ICD-10-CM | POA: Diagnosis not present

## 2018-04-17 DIAGNOSIS — Z794 Long term (current) use of insulin: Secondary | ICD-10-CM | POA: Insufficient documentation

## 2018-04-17 DIAGNOSIS — D72829 Elevated white blood cell count, unspecified: Secondary | ICD-10-CM | POA: Insufficient documentation

## 2018-04-17 DIAGNOSIS — Z9641 Presence of insulin pump (external) (internal): Secondary | ICD-10-CM | POA: Insufficient documentation

## 2018-04-17 DIAGNOSIS — R51 Headache: Secondary | ICD-10-CM | POA: Diagnosis not present

## 2018-04-17 DIAGNOSIS — Z79899 Other long term (current) drug therapy: Secondary | ICD-10-CM | POA: Insufficient documentation

## 2018-04-17 LAB — CBG MONITORING, ED
GLUCOSE-CAPILLARY: 566 mg/dL — AB (ref 70–99)
GLUCOSE-CAPILLARY: 499 mg/dL — AB (ref 70–99)
GLUCOSE-CAPILLARY: 364 mg/dL — AB (ref 70–99)
GLUCOSE-CAPILLARY: 294 mg/dL — AB (ref 70–99)
GLUCOSE-CAPILLARY: 270 mg/dL — AB (ref 70–99)
GLUCOSE-CAPILLARY: 134 mg/dL — AB (ref 70–99)
GLUCOSE-CAPILLARY: 131 mg/dL — AB (ref 70–99)
GLUCOSE-CAPILLARY: 131 mg/dL — AB (ref 70–99)
Glucose-Capillary: 451 mg/dL — ABNORMAL HIGH (ref 70–99)
Glucose-Capillary: 373 mg/dL — ABNORMAL HIGH (ref 70–99)
Glucose-Capillary: 212 mg/dL — ABNORMAL HIGH (ref 70–99)
Glucose-Capillary: 179 mg/dL — ABNORMAL HIGH (ref 70–99)
Glucose-Capillary: 153 mg/dL — ABNORMAL HIGH (ref 70–99)
Glucose-Capillary: 187 mg/dL — ABNORMAL HIGH (ref 70–99)
Glucose-Capillary: 138 mg/dL — ABNORMAL HIGH (ref 70–99)

## 2018-04-17 LAB — GLUCOSE, CAPILLARY
GLUCOSE-CAPILLARY: 152 mg/dL — AB (ref 70–99)
Glucose-Capillary: 131 mg/dL — ABNORMAL HIGH (ref 70–99)
Glucose-Capillary: 121 mg/dL — ABNORMAL HIGH (ref 70–99)

## 2018-04-17 LAB — COMPREHENSIVE METABOLIC PANEL
ALT: 41 U/L (ref 0–44)
ANION GAP: 28 — AB (ref 5–15)
AST: 49 U/L — ABNORMAL HIGH (ref 15–41)
Albumin: 5 g/dL (ref 3.5–5.0)
Alkaline Phosphatase: 139 U/L — ABNORMAL HIGH (ref 38–126)
BUN: 20 mg/dL (ref 6–20)
CHLORIDE: 104 mmol/L (ref 98–111)
CO2: 8 mmol/L — ABNORMAL LOW (ref 22–32)
CREATININE: 1.89 mg/dL — AB (ref 0.61–1.24)
Calcium: 10.3 mg/dL (ref 8.9–10.3)
GFR, EST AFRICAN AMERICAN: 57 mL/min — AB (ref >60)
GFR, EST NON AFRICAN AMERICAN: 49 mL/min — AB (ref >60)
Glucose, Bld: 580 mg/dL (ref 70–99)
POTASSIUM: 5.8 mmol/L — AB (ref 3.5–5.1)
SODIUM: 140 mmol/L (ref 135–145)
Total Bilirubin: 2 mg/dL — ABNORMAL HIGH (ref 0.3–1.2)
Total Protein: 9.4 g/dL — ABNORMAL HIGH (ref 6.5–8.1)

## 2018-04-17 LAB — URINALYSIS, ROUTINE W REFLEX MICROSCOPIC
BACTERIA UA: NONE SEEN
Bilirubin Urine: NEGATIVE
HGB URINE DIPSTICK: NEGATIVE
KETONES UR: 80 mg/dL — AB
Leukocytes, UA: NEGATIVE
Nitrite: NEGATIVE
PH: 5 (ref 5.0–8.0)
Protein, ur: NEGATIVE mg/dL
Specific Gravity, Urine: 1.024 (ref 1.005–1.030)

## 2018-04-17 LAB — CBC WITH DIFFERENTIAL/PLATELET
Abs Immature Granulocytes: 0.86 10*3/uL — ABNORMAL HIGH (ref 0.00–0.07)
BASOS PCT: 0 %
Basophils Absolute: 0.1 10*3/uL (ref 0.0–0.1)
EOS ABS: 0 10*3/uL (ref 0.0–0.5)
Eosinophils Relative: 0 %
HCT: 52.4 % — ABNORMAL HIGH (ref 39.0–52.0)
Hemoglobin: 16.5 g/dL (ref 13.0–17.0)
Immature Granulocytes: 3 %
Lymphocytes Relative: 8 %
Lymphs Abs: 2.6 10*3/uL (ref 0.7–4.0)
MCH: 28.8 pg (ref 26.0–34.0)
MCHC: 31.5 g/dL (ref 30.0–36.0)
MCV: 91.6 fL (ref 80.0–100.0)
MONOS PCT: 4 %
Monocytes Absolute: 1.1 10*3/uL — ABNORMAL HIGH (ref 0.1–1.0)
NEUTROS ABS: 26.9 10*3/uL — AB (ref 1.7–7.7)
NEUTROS PCT: 85 %
PLATELETS: 364 10*3/uL (ref 150–400)
RBC: 5.72 MIL/uL (ref 4.22–5.81)
RDW: 12.7 % (ref 11.5–15.5)
WBC: 31.5 10*3/uL — AB (ref 4.0–10.5)
nRBC: 0 % (ref 0.0–0.2)

## 2018-04-17 LAB — BASIC METABOLIC PANEL
ANION GAP: 17 — AB (ref 5–15)
Anion gap: 11 (ref 5–15)
Anion gap: 10 (ref 5–15)
Anion gap: 9 (ref 5–15)
BUN: 17 mg/dL (ref 6–20)
BUN: 12 mg/dL (ref 6–20)
BUN: 10 mg/dL (ref 6–20)
BUN: 9 mg/dL (ref 6–20)
CALCIUM: 8.6 mg/dL — AB (ref 8.9–10.3)
CALCIUM: 8.9 mg/dL (ref 8.9–10.3)
CALCIUM: 8.8 mg/dL — AB (ref 8.9–10.3)
CO2: 9 mmol/L — ABNORMAL LOW (ref 22–32)
CO2: 13 mmol/L — AB (ref 22–32)
CO2: 16 mmol/L — AB (ref 22–32)
CO2: 18 mmol/L — AB (ref 22–32)
CREATININE: 1.53 mg/dL — AB (ref 0.61–1.24)
CREATININE: 1.23 mg/dL (ref 0.61–1.24)
CREATININE: 0.93 mg/dL (ref 0.61–1.24)
Calcium: 9 mg/dL (ref 8.9–10.3)
Chloride: 116 mmol/L — ABNORMAL HIGH (ref 98–111)
Chloride: 117 mmol/L — ABNORMAL HIGH (ref 98–111)
Chloride: 116 mmol/L — ABNORMAL HIGH (ref 98–111)
Chloride: 113 mmol/L — ABNORMAL HIGH (ref 98–111)
Creatinine, Ser: 0.88 mg/dL (ref 0.61–1.24)
GFR calc non Af Amer: >60 mL/min (ref >60)
GFR calc non Af Amer: >60 mL/min (ref >60)
Glucose, Bld: 428 mg/dL — ABNORMAL HIGH (ref 70–99)
Glucose, Bld: 242 mg/dL — ABNORMAL HIGH (ref 70–99)
Glucose, Bld: 163 mg/dL — ABNORMAL HIGH (ref 70–99)
Glucose, Bld: 134 mg/dL — ABNORMAL HIGH (ref 70–99)
Potassium: 6.1 mmol/L — ABNORMAL HIGH (ref 3.5–5.1)
Potassium: 4.6 mmol/L (ref 3.5–5.1)
Potassium: 4.1 mmol/L (ref 3.5–5.1)
Potassium: 3.3 mmol/L — ABNORMAL LOW (ref 3.5–5.1)
SODIUM: 142 mmol/L (ref 135–145)
SODIUM: 141 mmol/L (ref 135–145)
SODIUM: 142 mmol/L (ref 135–145)
Sodium: 140 mmol/L (ref 135–145)

## 2018-04-17 LAB — MRSA PCR SCREENING: MRSA BY PCR: NEGATIVE

## 2018-04-17 LAB — RAPID URINE DRUG SCREEN, HOSP PERFORMED
AMPHETAMINES: NOT DETECTED
BARBITURATES: NOT DETECTED
BENZODIAZEPINES: NOT DETECTED
Cocaine: NOT DETECTED
Opiates: NOT DETECTED
Tetrahydrocannabinol: NOT DETECTED

## 2018-04-17 LAB — CBC
HCT: 43.1 % (ref 39.0–52.0)
Hemoglobin: 14.1 g/dL (ref 13.0–17.0)
MCH: 29.5 pg (ref 26.0–34.0)
MCHC: 32.7 g/dL (ref 30.0–36.0)
MCV: 90.2 fL (ref 80.0–100.0)
NRBC: 0 % (ref 0.0–0.2)
PLATELETS: 316 10*3/uL (ref 150–400)
RBC: 4.78 MIL/uL (ref 4.22–5.81)
RDW: 12.6 % (ref 11.5–15.5)
WBC: 28.5 10*3/uL — ABNORMAL HIGH (ref 4.0–10.5)

## 2018-04-17 LAB — TROPONIN I: Troponin I: <0.03 ng/mL (ref <0.03)

## 2018-04-17 LAB — LIPASE, BLOOD: LIPASE: 29 U/L (ref 11–51)

## 2018-04-17 LAB — BETA-HYDROXYBUTYRIC ACID: Beta-Hydroxybutyric Acid: 6.81 mmol/L — ABNORMAL HIGH (ref 0.05–0.27)

## 2018-04-17 MED ORDER — INSULIN DETEMIR 100 UNIT/ML ~~LOC~~ SOLN
12.0000 [IU] | SUBCUTANEOUS | Status: DC
  Administered 2018-04-17: 12 [IU] via SUBCUTANEOUS
  Filled 2018-04-17 (×2): qty 0.12

## 2018-04-17 MED ORDER — SODIUM CHLORIDE 0.9 % IV BOLUS (SEPSIS)
2000.0000 mL | Freq: Once | INTRAVENOUS | Status: AC
  Administered 2018-04-17: 2000 mL via INTRAVENOUS

## 2018-04-17 MED ORDER — FENTANYL CITRATE (PF) 100 MCG/2ML IJ SOLN
50.0000 ug | Freq: Once | INTRAMUSCULAR | Status: AC
  Administered 2018-04-17: 50 ug via INTRAVENOUS
  Filled 2018-04-17: qty 2

## 2018-04-17 MED ORDER — ENOXAPARIN SODIUM 40 MG/0.4ML ~~LOC~~ SOLN
40.0000 mg | Freq: Every day | SUBCUTANEOUS | Status: DC
  Administered 2018-04-17: 40 mg via SUBCUTANEOUS
  Filled 2018-04-17: qty 0.4

## 2018-04-17 MED ORDER — SODIUM CHLORIDE 0.9 % IV SOLN
INTRAVENOUS | Status: DC
  Administered 2018-04-17: 05:00:00 via INTRAVENOUS

## 2018-04-17 MED ORDER — INSULIN ASPART 100 UNIT/ML ~~LOC~~ SOLN
0.0000 [IU] | Freq: Three times a day (TID) | SUBCUTANEOUS | Status: DC
  Administered 2018-04-18: 2 [IU] via SUBCUTANEOUS

## 2018-04-17 MED ORDER — INSULIN REGULAR(HUMAN) IN NACL 100-0.9 UT/100ML-% IV SOLN
INTRAVENOUS | Status: DC
  Filled 2018-04-17: qty 100

## 2018-04-17 MED ORDER — ONDANSETRON HCL 4 MG/2ML IJ SOLN
4.0000 mg | Freq: Once | INTRAMUSCULAR | Status: AC
  Administered 2018-04-17: 4 mg via INTRAVENOUS

## 2018-04-17 MED ORDER — DEXTROSE-NACL 5-0.45 % IV SOLN
INTRAVENOUS | Status: DC
  Administered 2018-04-17: 12:00:00 via INTRAVENOUS

## 2018-04-17 MED ORDER — INSULIN REGULAR(HUMAN) IN NACL 100-0.9 UT/100ML-% IV SOLN
INTRAVENOUS | Status: DC
  Administered 2018-04-17: 3.9 [IU]/h via INTRAVENOUS

## 2018-04-17 NOTE — ED Provider Notes (Signed)
EKG Interpretation  Date/Time:  Monday April 17 2018 06:21:39 EST Ventricular Rate:  115 PR Interval:    QRS Duration: 89 QT Interval:  303 QTC Calculation: 419 R Axis:   72 Text Interpretation:  Sinus tachycardia Borderline T wave abnormalities   Confirmed by Zadie RhineWickline, Zayde Stroupe ([id number]) on 04/17/2018 6:30:39 AM         Zadie RhineWickline, Yuan Gann, MD 04/17/18 [id number]

## 2018-04-17 NOTE — ED Notes (Signed)
To x-ray

## 2018-04-17 NOTE — ED Notes (Signed)
Family members given Ginger ale and coffee to drink. Also graham crackers and peanut butter to eat

## 2018-04-17 NOTE — ED Notes (Signed)
Called 2C to give report.  Receiving RN unable to take report at this time. 

## 2018-04-17 NOTE — ED Triage Notes (Signed)
The pt reports that he has food poisoning with abd pain nausea vomiting diarrhea since last pn  On arrival hyperventilating diabetic

## 2018-04-17 NOTE — ED Notes (Signed)
Delay in lab draw MD at bedside. 

## 2018-04-17 NOTE — ED Provider Notes (Signed)
[facility] EMERGENCY DEPARTMENT Provider Note   CSN: [id number] Arrival date & time: 04/17/18  0249     History   Chief Complaint Chief Complaint  Patient presents with  . Abdominal Pain    HPI Mason Mclaughlin is a 22 y.o. male.  The history is provided by the patient, a relative and a parent.  Abdominal Pain   This is a new problem. The current episode started 6 to 12 hours ago. The problem occurs constantly. The problem has been gradually worsening. The pain is associated with eating. The pain is located in the generalized abdominal region. The pain is moderate. Associated symptoms include diarrhea, nausea and vomiting. Pertinent negatives include fever and melena. Nothing aggravates the symptoms. Nothing relieves the symptoms.  pt with h/o of diabetes controlled by insulin pump presents with nausea/vomiting/diarrhea.  He reports he ate at a [place] buffet the previous night, and since has been having diarrhea now vomiting.  He also reports generalized abdominal pain.  He denies any bloody emesis or bloody stool.  He does not think he is in DKA  Past Medical History:  Diagnosis Date  . Diabetes mellitus without complication [facility])     Patient Active Problem List   Diagnosis Date Noted  . Nausea and vomiting   . DKA (diabetic ketoacidoses) (HCC) 05/29/2017    History reviewed. No pertinent surgical history.      Home Medications    Prior to Admission medications   Medication Sig Start Date End Date Taking? Authorizing Provider  acetaminophen (TYLENOL) 500 MG tablet Take 1,500 mg by mouth daily as needed for moderate pain.     [provider]  GuaiFENesin (COUGH SYRUP PO) Take 30 mLs by mouth 2 (two) times daily as needed (FLU, COUGH).    [provider]  Insulin Human (INSULIN PUMP) SOLN 10 UNITS OF NOVOLOG 100 UNITS/ML PER EVERY GRAM OF CARBOHYDRATES VIA INSULIN PUMP    [provider]  metoCLOPramide (REGLAN) 10 MG  tablet Take 1 tablet (10 mg total) by mouth 2 (two) times daily. 03/18/18 04/29/18  Burnadette Pop, MD  potassium chloride SA (K-DUR,KLOR-CON) 20 MEQ tablet Take 1 tablet (20 mEq total) by mouth daily for 7 days. 03/19/18 03/26/18  Burnadette Pop, MD    Family History Family History  Family history unknown: Yes    Social History Social History   Tobacco Use  . Smoking status: Never Smoker  . Smokeless tobacco: Never Used  Substance Use Topics  . Alcohol use: Yes  . Drug use: Yes    Frequency: 4.0 times per week    Types: Marijuana     Allergies   Patient has no known allergies.   Review of Systems Review of Systems  Constitutional: Positive for fatigue. Negative for fever.  Gastrointestinal: Positive for abdominal pain, diarrhea, nausea and vomiting. Negative for blood in stool and melena.  All other systems reviewed and are negative.    Physical Exam Updated Vital Signs BP (!) 144/88 (BP Location: Right Arm)   Pulse (!) 130   Temp 98.7 F (37.1 C) (Oral)   Resp (!) 22   Ht 1.651 m (5\' 5" )   Wt 59 kg   SpO2 100%   BMI 21.63 kg/m   Physical Exam CONSTITUTIONAL: Patient is well-developed but ill-appearing, tachypneic and smells of ketones HEAD: Normocephalic/atraumatic EYES: EOMI/PERRL ENMT: Mucous membranes dry NECK: supple no meningeal signs SPINE/BACK:entire spine nontender CV: S1/S2 noted, tachycardic LUNGS: Lungs are clear to auscultation bilaterally, tachypnea  ABDOMEN: soft, nontender, no rebound or guarding, bowel sounds noted throughout abdomen GU:no cva tenderness NEURO: Pt is awake/alert/appropriate, moves all extremitiesx4.  No facial droop.   EXTREMITIES: pulses normal/equal, full ROM SKIN: warm, color normal PSYCH: Mildly anxious  ED Treatments / Results  Labs (all labs ordered are listed, but only abnormal results are displayed) Labs Reviewed  CBC WITH DIFFERENTIAL/PLATELET - Abnormal; Notable for the following components:      Result  Value   WBC 31.5 (*)    HCT 52.4 (*)    Neutro Abs 26.9 (*)    Monocytes Absolute 1.1 (*)    Abs Immature Granulocytes 0.86 (*)    All other components within normal limits  COMPREHENSIVE METABOLIC PANEL - Abnormal; Notable for the following components:   Potassium 5.8 (*)    CO2 8 (*)    Glucose, Bld 580 (*)    Creatinine, Ser 1.89 (*)    Total Protein 9.4 (*)    AST 49 (*)    Alkaline Phosphatase 139 (*)    Total Bilirubin 2.0 (*)    GFR calc non Af Amer 49 (*)    GFR calc Af Amer 57 (*)    Anion gap 28 (*)    All other components within normal limits  CBG MONITORING, ED - Abnormal; Notable for the following components:   Glucose-Capillary 566 (*)    All other components within normal limits  CBG MONITORING, ED - Abnormal; Notable for the following components:   Glucose-Capillary 499 (*)    All other components within normal limits  LIPASE, BLOOD  URINALYSIS, ROUTINE W REFLEX MICROSCOPIC    EKG None  Radiology No results found.  Procedures Procedures   CRITICAL CARE Performed by: Joya Gaskinsonald W Pamelia Botto Total critical care time: 35 minutes Critical care time was exclusive of separately billable procedures and treating other patients. Critical care was necessary to treat or prevent imminent or life-threatening deterioration. Critical care was time spent personally by me on the following activities: development of treatment plan with patient and/or surrogate as well as nursing, discussions with consultants, evaluation of patient's response to treatment, examination of patient, obtaining history from patient or surrogate, ordering and performing treatments and interventions, ordering and review of laboratory studies, ordering and review of radiographic studies, pulse oximetry and re-evaluation of patient's condition. Patient with DKA requiring IV fluids, IV insulin drip and admission Medications Ordered in ED Medications  insulin regular, human (MYXREDLIN) 100 units/ 100 mL  infusion (has no administration in time range)  fentaNYL (SUBLIMAZE) injection 50 mcg (has no administration in time range)  sodium chloride 0.9 % bolus 2,000 mL (2,000 mLs Intravenous New Bag/Given 04/17/18 0323)  ondansetron (ZOFRAN) injection 4 mg (4 mg Intravenous Given 04/17/18 0323)     Initial Impression / Assessment and Plan / ED Course  I have reviewed the triage vital signs and the nursing notes.  Pertinent labs results that were available during my care of the patient were reviewed by me and considered in my medical decision making (see chart for details).     3:21 AM Patient reports eating at a [place] buffet that triggered vomiting and diarrhea.  Strong suspicion the patient is in DKA currently.  Labs pending, IV fluids been ordered.  He has no focal abdominal tenderness, defer imaging at this time 4:45 AM Patient appears to be responding to IV fluids.  He is taking ice chips.  He has no focal abdominal tenderness at this time.  He does report mild  headache.  He does have an elevated white count, but no focal tenderness on abdominal exam therefore will not obtain imaging at this time.  This can be monitored in hospital  Discussed with Dr. Toniann Fail for admission.  Patient and family updated on plan Final Clinical Impressions(s) / ED Diagnoses   Final diagnoses:  Diabetic ketoacidosis without coma associated with type 1 diabetes mellitus (HCC)  Dehydration  AKI (acute kidney injury) [facility])    ED Discharge Orders    None       Zadie Rhine, MD 04/17/18 [phone]

## 2018-04-17 NOTE — Progress Notes (Signed)
Inpatient Diabetes Program Recommendations  AACE/ADA: New Consensus Statement on Inpatient Glycemic Control (2015)  Target Ranges:  Prepandial:   less than 140 mg/dL      Peak postprandial:   less than 180 mg/dL (1-2 hours)      Critically ill patients:  140 - 180 mg/dL   Lab Results  Component Value Date   GLUCAP 179 (H) 04/17/2018   HGBA1C 12.2 (H) 03/15/2018    Review of Glycemic Control  Diabetes history:type 1 Outpatient Diabetes medications: Insulin pump Current orders for Inpatient glycemic control: IV insulin  Inpatient Diabetes Program Recommendations:    Patient  Admitted in severe DKA. Noted that insulin pump settings from last admission in October, 2019 were:  Basal Settings: Time Basal Rate (u/hr)  Midnight 1.3  6:30AM 1.5  Noon 1.3  11PM 1.1   Lantus in case of pump failure 32 units    Noted that last admission in October, 2019, patient was placed on SQ insulin for control. Recommend Lantus 25 units daily, Novolog SENSITIVE correction scale every 4 hours while NPO for transition off IV insulin when CBGs within the transition phase. Will need to have Lantus 2 hours prior to stopping IV insulin and then starting Novolog correction scale as soon as IV insulin is stopped.   Will continue to monitor blood sugars while in the hospital.   Mason MinceKendra Neosha Switalski RN BSN CDE Diabetes Coordinator Pager: [phone]  8am-5pm

## 2018-04-17 NOTE — ED Notes (Signed)
The pt has an insulin pump that he has turned off

## 2018-04-17 NOTE — ED Notes (Signed)
1st liter of nss infused 2nd liter  Added to iv

## 2018-04-17 NOTE — ED Notes (Signed)
Checked CBG 364, RN Chris informed

## 2018-04-17 NOTE — ED Notes (Signed)
Checked CBG 451, RN Chris informed

## 2018-04-17 NOTE — H&P (Signed)
History and Physical    Vernon Maish QMV:[id number] DOB: [birth year] DOA: 04/17/2018  PCP: Patient, No Pcp Per  Patient coming from: Home.  Chief Complaint: Nausea vomiting and diarrhea.  HPI: Damaria Stofko is a 22 y.o. male with history of diabetes mellitus type 1 on insulin pump was recently admitted last month for diabetic ketoacidosis presents to the ER with persistent nausea vomiting and diarrhea.  Patient states his symptoms started yesterday morning with nausea vomiting and diarrhea.  Night before yesterday patient had some [place] food.  Has some abdominal cramps.  After vomiting multiple times patient started having some chest discomfort which resolved at this time.  Since last night patient has been having some frontal headache but denies any weakness of upper or lower extremities or any visual symptoms or any difficulty swallowing or speaking.  ED Course: In the ER patient blood sugars found to be elevated with bicarb of 8 anion gap of 28.  Patient was started on fluid bolus for diabetic ketoacidosis and also on IV insulin infusion.  Review of Systems: As per HPI, rest all negative.   Past Medical History:  Diagnosis Date  . Diabetes mellitus without complication (HCC)     History reviewed. No pertinent surgical history.   reports that he has never smoked. He has never used smokeless tobacco. He reports that he drinks alcohol. He reports that he has current or past drug history. Drug: Marijuana. Frequency: 4.00 times per week.  No Known Allergies  Family History  Family history unknown: Yes    Prior to Admission medications   Medication Sig Start Date End Date Taking? Authorizing Provider  acetaminophen (TYLENOL) 500 MG tablet Take 1,500 mg by mouth daily as needed for moderate pain.    Yes [provider]  GuaiFENesin (COUGH SYRUP PO) Take 30 mLs by mouth 2 (two) times daily as needed (FLU, COUGH).   Yes [provider]  Insulin Human (INSULIN  PUMP) SOLN 10 UNITS OF NOVOLOG 100 UNITS/ML PER EVERY GRAM OF CARBOHYDRATES VIA INSULIN PUMP   Yes [provider]  metoCLOPramide (REGLAN) 10 MG tablet Take 1 tablet (10 mg total) by mouth 2 (two) times daily. Patient taking differently: Take 10 mg by mouth 2 (two) times daily as needed for nausea or vomiting.  03/18/18 04/29/18 Yes Burnadette Pop, MD  potassium chloride SA (K-DUR,KLOR-CON) 20 MEQ tablet Take 1 tablet (20 mEq total) by mouth daily for 7 days. 03/19/18 04/17/18 Yes Burnadette Pop, MD    Physical Exam: Vitals:   04/17/18 0258 04/17/18 0300  BP: (!) 144/88 138/90  Pulse: (!) 130 (!) 127  Resp: (!) 22 19  Temp: 98.7 F (37.1 C)   TempSrc: Oral   SpO2: 100% 99%  Weight: 59 kg   Height: 5\' 5"  (1.651 m)       Constitutional: Moderately built and nourished. Vitals:   04/17/18 0258 04/17/18 0300  BP: (!) 144/88 138/90  Pulse: (!) 130 (!) 127  Resp: (!) 22 19  Temp: 98.7 F (37.1 C)   TempSrc: Oral   SpO2: 100% 99%  Weight: 59 kg   Height: 5\' 5"  (1.651 m)    Eyes: Anicteric no pallor. ENMT: No discharge from the ears eyes nose or mouth. Neck: No mass or.  No neck rigidity.  No JVD appreciated. Respiratory: No rhonchi or crepitations. Cardiovascular: S1-S2 heard no murmurs appreciated. Abdomen: Soft nontender bowel sounds present. Musculoskeletal: No edema.  No joint effusion. Skin: No rash. Neurologic: Alert awake oriented to  time place and person.  Moves all extremities. Psychiatric: Appears normal.  Normal affect.   Labs on Admission: I have personally reviewed following labs and imaging studies  CBC: Recent Labs  Lab 04/17/18 0301  WBC 31.5*  NEUTROABS 26.9*  HGB 16.5  HCT 52.4*  MCV 91.6  PLT 364   Basic Metabolic Panel: Recent Labs  Lab 04/17/18 0301  NA 140  K 5.8*  CL 104  CO2 8*  GLUCOSE 580*  BUN 20  CREATININE 1.89*  CALCIUM 10.3   GFR: Estimated Creatinine Clearance: 51.6 mL/min (A) (by C-G formula based on SCr of  1.89 mg/dL (H)). Liver Function Tests: Recent Labs  Lab 04/17/18 0301  AST 49*  ALT 41  ALKPHOS 139*  BILITOT 2.0*  PROT 9.4*  ALBUMIN 5.0   Recent Labs  Lab 04/17/18 0301  LIPASE 29   No results for input(s): AMMONIA in the last 168 hours. Coagulation Profile: No results for input(s): INR, PROTIME in the last 168 hours. Cardiac Enzymes: No results for input(s): CKTOTAL, CKMB, CKMBINDEX, TROPONINI in the last 168 hours. BNP (last 3 results) No results for input(s): PROBNP in the last 8760 hours. HbA1C: No results for input(s): HGBA1C in the last 72 hours. CBG: Recent Labs  Lab 04/17/18 0255 04/17/18 0405  GLUCAP 566* 499*   Lipid Profile: No results for input(s): CHOL, HDL, LDLCALC, TRIG, CHOLHDL, LDLDIRECT in the last 72 hours. Thyroid Function Tests: No results for input(s): TSH, T4TOTAL, FREET4, T3FREE, THYROIDAB in the last 72 hours. Anemia Panel: No results for input(s): VITAMINB12, FOLATE, FERRITIN, TIBC, IRON, RETICCTPCT in the last 72 hours. Urine analysis:    Component Value Date/Time   COLORURINE YELLOW 03/14/2018 2235   APPEARANCEUR CLEAR 03/14/2018 2235   LABSPEC 1.021 03/14/2018 2235   PHURINE 5.0 03/14/2018 2235   GLUCOSEU >=500 (A) 03/14/2018 2235   HGBUR MODERATE (A) 03/14/2018 2235   BILIRUBINUR NEGATIVE 03/14/2018 2235   KETONESUR 80 (A) 03/14/2018 2235   PROTEINUR 100 (A) 03/14/2018 2235   NITRITE NEGATIVE 03/14/2018 2235   LEUKOCYTESUR NEGATIVE 03/14/2018 2235   Sepsis Labs: @LABRCNTIP (procalcitonin:4,lacticidven:4) )No results found for this or any previous visit (from the past 240 hour(s)).   Radiological Exams on Admission: No results found.    Assessment/Plan Principal Problem:   DKA (diabetic ketoacidoses) (HCC) Active Problems:   Nausea and vomiting   ARF (acute renal failure) (HCC)    1. Diabetic ketoacidosis and type I -likely precipitated by vomiting and diarrhea which could be possible food poisoning.  At this time  patient has received 3 L normal saline bolus will continue with aggressive hydration closely follow metabolic panel intake output. 2. Nausea vomiting diarrhea could be from food poisoning.  Abdomen appears benign at this time.  Only AST is mildly elevated.  Follow LFTs and lipase. 3. Mild hyperkalemia likely from DKA.  I think will correct with IV insulin.  Follow metabolic panel. 4. Acute renal failure likely from dehydration from vomiting and diarrhea.  Follow metabolic panel patient is receiving IV fluids. 5. Headache -appears nonfocal.  Continue to monitor if pain persist may consider further imaging. 6. Chest pain has resolved.  Likely from vomiting.  EKG and troponin are pending.  Presently chest pain-free. 7. Leukocytosis -patient remains afebrile.  X-ray of the chest abdomen and UA are pending.  Continue to monitor.  Note that patient's acute abdominal series EKG and troponin and urine analysis and urine drug screen are pending.   DVT prophylaxis: Lovenox. Code Status: Full  code. Family Communication: Discussed with patient's mother. Disposition Plan: Home. Consults called: None. Admission status: Observation.   Eduard ClosArshad N Kakrakandy MD [facility] Pager [phone].  If 7PM-7AM, please contact night-coverage [email or web address] Password [email or web address]  04/17/2018, 5:00 AM

## 2018-04-17 NOTE — Plan of Care (Signed)

## 2018-04-17 NOTE — Progress Notes (Signed)
22 year old male Known diabetic since age 22 on insulin pump Ate some suspicious [place] food and seemed to got sick Found to be in DKA  Agree with plan as per my partner He is slightly hyperkalemic I have discussed with nursing to make sure potassium is checked as per protocol we will not transition unless his gap is closed it is 18 now Transition orders to be placed once does seem to have resolved in terms of acidosis  Suspect will need no more than another 24 to 48 hours in hospital  Pleas KochJai Velma Agnes, MD [facility] 8:52 AM

## 2018-04-17 NOTE — ED Notes (Signed)
Checked CBG 499, RN Chris informed

## 2018-04-18 DIAGNOSIS — E860 Dehydration: Secondary | ICD-10-CM

## 2018-04-18 DIAGNOSIS — N179 Acute kidney failure, unspecified: Secondary | ICD-10-CM | POA: Diagnosis not present

## 2018-04-18 DIAGNOSIS — R112 Nausea with vomiting, unspecified: Secondary | ICD-10-CM | POA: Diagnosis not present

## 2018-04-18 LAB — BASIC METABOLIC PANEL
ANION GAP: 13 (ref 5–15)
Anion gap: 10 (ref 5–15)
BUN: 9 mg/dL (ref 6–20)
BUN: 9 mg/dL (ref 6–20)
CO2: 17 mmol/L — ABNORMAL LOW (ref 22–32)
CO2: 18 mmol/L — ABNORMAL LOW (ref 22–32)
CREATININE: 0.96 mg/dL (ref 0.61–1.24)
Calcium: 8.8 mg/dL — ABNORMAL LOW (ref 8.9–10.3)
Calcium: 8.9 mg/dL (ref 8.9–10.3)
Chloride: 110 mmol/L (ref 98–111)
Chloride: 105 mmol/L (ref 98–111)
Creatinine, Ser: 0.96 mg/dL (ref 0.61–1.24)
GFR calc Af Amer: >60 mL/min (ref >60)
GFR calc non Af Amer: >60 mL/min (ref >60)
Glucose, Bld: 97 mg/dL (ref 70–99)
Glucose, Bld: 196 mg/dL — ABNORMAL HIGH (ref 70–99)
Potassium: 3.1 mmol/L — ABNORMAL LOW (ref 3.5–5.1)
Potassium: 3.4 mmol/L — ABNORMAL LOW (ref 3.5–5.1)
SODIUM: 136 mmol/L (ref 135–145)
Sodium: 137 mmol/L (ref 135–145)

## 2018-04-18 LAB — BETA-HYDROXYBUTYRIC ACID: Beta-Hydroxybutyric Acid: 3.94 mmol/L — ABNORMAL HIGH (ref 0.05–0.27)

## 2018-04-18 LAB — GLUCOSE, CAPILLARY
GLUCOSE-CAPILLARY: 285 mg/dL — AB (ref 70–99)
Glucose-Capillary: 106 mg/dL — ABNORMAL HIGH (ref 70–99)
Glucose-Capillary: 169 mg/dL — ABNORMAL HIGH (ref 70–99)
Glucose-Capillary: 259 mg/dL — ABNORMAL HIGH (ref 70–99)
Glucose-Capillary: 232 mg/dL — ABNORMAL HIGH (ref 70–99)

## 2018-04-18 MED ORDER — INSULIN PUMP
Freq: Three times a day (TID) | SUBCUTANEOUS | Status: DC
  Administered 2018-04-18: 1 via SUBCUTANEOUS
  Filled 2018-04-18: qty 1

## 2018-04-18 NOTE — Progress Notes (Signed)
Pt left unit in wheelchair . Taken to car.accompanied by self and relatives.

## 2018-04-18 NOTE — Discharge Summary (Signed)
Physician Discharge Summary  Mason Mclaughlin [name] DOB: 04/02/1996 DOA: 04/17/2018  PCP: Patient, No Pcp Per  Admit date: 04/17/2018 Discharge date: 04/18/2018  Admitted From: Home Disposition:  Home  Recommendations for Outpatient Follow-up:  1. Follow up with PCP in 1-2 weeks  Discharge Condition:Improved CODE STATUS:Full Diet recommendation: Diabetic   Brief/Interim Summary: 22 y.o. male with history of diabetes mellitus type 1 on insulin pump was recently admitted last month for diabetic ketoacidosis presents to the ER with persistent nausea vomiting and diarrhea.  Patient states his symptoms started yesterday morning with nausea vomiting and diarrhea.  Night before yesterday patient had some [place] food.  Has some abdominal cramps.  After vomiting multiple times patient started having some chest discomfort which resolved at this time.  Since last night patient has been having some frontal headache but denies any weakness of upper or lower extremities or any visual symptoms or any difficulty swallowing or speaking.  ED Course: In the ER patient blood sugars found to be elevated with bicarb of 8 anion gap of 28.  Patient was started on fluid bolus for diabetic ketoacidosis and also on IV insulin infusion.  Discharge Diagnoses:  Principal Problem:   DKA (diabetic ketoacidoses) (HCC) Active Problems:   Nausea and vomiting   ARF (acute renal failure) (HCC)  1. Diabetic ketoacidosis and type I - 1. Anion gap closed, DKA resolved 2. Improved with IVF hydration 3. Had transitioned off insulin gtt to subQ levemir. 4. Discussed with diabetic coordinator with plans to transition to insulin pump 2. Nausea vomiting diarrhea could be from food poisoning.   1. Symptoms resolved 3. Mild hyperkalemia likely from DKA.  I 1. Corrected 4. Acute renal failure likely from dehydration from vomiting and diarrhea.   1. Renal function normalized 5. Headache -appears nonfocal.    1. Resolved. Stable 6. Chest pain has resolved.   1. Likely from vomiting.   2. Presently chest pain-free. 7. Leukocytosis 1. Trending down 2. Likely related to DKA with gastroenteritis  Discharge Instructions   Allergies as of 04/18/2018   No Known Allergies     Medication List    STOP taking these medications   COUGH SYRUP PO   metoCLOPramide 10 MG tablet Commonly known as:  REGLAN   potassium chloride SA 20 MEQ tablet Commonly known as:  K-DUR,KLOR-CON     TAKE these medications   acetaminophen 500 MG tablet Commonly known as:  TYLENOL Take 1,500 mg by mouth daily as needed for moderate pain.   insulin pump Soln 10 UNITS OF NOVOLOG 100 UNITS/ML PER EVERY GRAM OF CARBOHYDRATES VIA INSULIN PUMP       No Known Allergies    Procedures/Studies: Dg Abd Acute W/chest  Result Date: 04/17/2018 CLINICAL DATA:  Nausea and vomiting EXAM: DG ABDOMEN ACUTE W/ 1V CHEST COMPARISON:  03/16/2018 FINDINGS: There is no evidence of dilated bowel loops or free intraperitoneal air. No radiopaque calculi or other significant radiographic abnormality is seen. Heart size and mediastinal contours are within normal limits. Both lungs are clear. IMPRESSION: Negative abdominal radiographs.  No acute cardiopulmonary disease. Electronically Signed   By: Deatra RobinsonKevin  Herman M.D.   On: 04/17/2018 05:57    Subjective: Eager to go home  Discharge Exam: Vitals:   04/18/18 0724 04/18/18 1152  BP: 120/86 116/81  Pulse: 66 76  Resp: 18 14  Temp: 97.8 F (36.6 C) 98.1 F (36.7 C)  SpO2: 100% 100%   Vitals:   04/17/18 2342 04/18/18 0402 04/18/18 0724 04/18/18  1152  BP: 116/71 118/78 120/86 116/81  Pulse: 92 78 66 76  Resp: 12 14 18 14   Temp: 98 F (36.7 C) 98 F (36.7 C) 97.8 F (36.6 C) 98.1 F (36.7 C)  TempSrc: Oral Oral Oral Oral  SpO2: 100% 97% 100% 100%  Weight:      Height:        General: Pt is alert, awake, not in acute distress Cardiovascular: RRR, S1/S2 +, no rubs, no  gallops Respiratory: CTA bilaterally, no wheezing, no rhonchi Abdominal: Soft, NT, ND, bowel sounds + Extremities: no edema, no cyanosis   The results of significant diagnostics from this hospitalization (including imaging, microbiology, ancillary and laboratory) are listed below for reference.     Microbiology: Recent Results (from the past 240 hour(s))  MRSA PCR Screening     Status: None   Collection Time: 04/17/18  6:37 PM  Result Value Ref Range Status   MRSA by PCR NEGATIVE NEGATIVE Final    Comment:        The GeneXpert MRSA Assay (FDA approved for NASAL specimens only), is one component of a comprehensive MRSA colonization surveillance program. It is not intended to diagnose MRSA infection nor to guide or monitor treatment for MRSA infections. Performed at [facility], [address] [place], [place] [address]      Labs: BNP (last 3 results) No results for input(s): BNP in the last 8760 hours. Basic Metabolic Panel: Recent Labs  Lab 04/17/18 0951 04/17/18 1258 04/17/18 1658 04/18/18 0224 04/18/18 1240  NA 141 142 140 137 136  K 4.6 4.1 3.3* 3.1* 3.4*  CL 117* 116* 113* 110 105  CO2 13* 16* 18* 17* 18*  GLUCOSE 242* 163* 134* 97 196*  BUN 12 10 9 9 9   CREATININE 1.23 0.93 0.88 0.96 0.96  CALCIUM 8.9 9.0 8.8* 8.8* 8.9   Liver Function Tests: Recent Labs  Lab 04/17/18 0301  AST 49*  ALT 41  ALKPHOS 139*  BILITOT 2.0*  PROT 9.4*  ALBUMIN 5.0   Recent Labs  Lab 04/17/18 0301  LIPASE 29   No results for input(s): AMMONIA in the last 168 hours. CBC: Recent Labs  Lab 04/17/18 0301 04/17/18 0557  WBC 31.5* 28.5*  NEUTROABS 26.9*  --   HGB 16.5 14.1  HCT 52.4* 43.1  MCV 91.6 90.2  PLT 364 316   Cardiac Enzymes: Recent Labs  Lab 04/17/18 0557  TROPONINI <0.03   BNP: Invalid input(s): POCBNP CBG: Recent Labs  Lab 04/17/18 1833 04/17/18 1949 04/17/18 2220 04/18/18 0800 04/18/18 1151  GLUCAP 131* 152* 121* 106* 169*    D-Dimer No results for input(s): DDIMER in the last 72 hours. Hgb A1c No results for input(s): HGBA1C in the last 72 hours. Lipid Profile No results for input(s): CHOL, HDL, LDLCALC, TRIG, CHOLHDL, LDLDIRECT in the last 72 hours. Thyroid function studies No results for input(s): TSH, T4TOTAL, T3FREE, THYROIDAB in the last 72 hours.  Invalid input(s): FREET3 Anemia work up No results for input(s): VITAMINB12, FOLATE, FERRITIN, TIBC, IRON, RETICCTPCT in the last 72 hours. Urinalysis    Component Value Date/Time   COLORURINE COLORLESS (A) 04/17/2018 0500   APPEARANCEUR CLEAR 04/17/2018 0500   LABSPEC 1.024 04/17/2018 0500   PHURINE 5.0 04/17/2018 0500   GLUCOSEU >=500 (A) 04/17/2018 0500   HGBUR NEGATIVE 04/17/2018 0500   BILIRUBINUR NEGATIVE 04/17/2018 0500   KETONESUR 80 (A) 04/17/2018 0500   PROTEINUR NEGATIVE 04/17/2018 0500   NITRITE NEGATIVE 04/17/2018 0500  LEUKOCYTESUR NEGATIVE 04/17/2018 0500   Sepsis Labs Invalid input(s): PROCALCITONIN,  WBC,  LACTICIDVEN Microbiology Recent Results (from the past 240 hour(s))  MRSA PCR Screening     Status: None   Collection Time: 04/17/18  6:37 PM  Result Value Ref Range Status   MRSA by PCR NEGATIVE NEGATIVE Final    Comment:        The GeneXpert MRSA Assay (FDA approved for NASAL specimens only), is one component of a comprehensive MRSA colonization surveillance program. It is not intended to diagnose MRSA infection nor to guide or monitor treatment for MRSA infections. Performed at [facility], [address] [place], [place] [address]    Time spent: 30 min  SIGNED:   Rickey Barbara, MD  [facility] 04/18/2018, 2:47 PM  If 7PM-7AM, please contact night-coverage

## 2018-04-18 NOTE — Progress Notes (Signed)
Pt is awake in bed,labs ordered for ketones, still receiving bolus from 4 pm blood sugars via the insulin pump. Dr Rhona Leavenshiu updated will continue to monitor.Pt insist hes going home regardless of the outcome

## 2018-06-29 ENCOUNTER — Encounter (HOSPITAL_COMMUNITY): Payer: Self-pay

## 2018-06-29 ENCOUNTER — Emergency Department (HOSPITAL_COMMUNITY)
Admission: EM | Admit: 2018-06-29 | Discharge: 2018-06-29 | Disposition: A | Discharge status: Home / Self Care | Payer: BC Managed Care – PPO | Attending: Emergency Medicine | Admitting: Emergency Medicine

## 2018-06-29 DIAGNOSIS — R112 Nausea with vomiting, unspecified: Secondary | ICD-10-CM | POA: Insufficient documentation

## 2018-06-29 DIAGNOSIS — R109 Unspecified abdominal pain: Secondary | ICD-10-CM | POA: Insufficient documentation

## 2018-06-29 DIAGNOSIS — E109 Type 1 diabetes mellitus without complications: Secondary | ICD-10-CM | POA: Diagnosis not present

## 2018-06-29 HISTORY — DX: Type 2 diabetes mellitus without complications: E11.9

## 2018-06-29 HISTORY — DX: Type 2 diabetes mellitus with ketoacidosis without coma: E11.10

## 2018-06-29 LAB — BASIC METABOLIC PANEL
Anion gap: 14 (ref 5–15)
BUN: 13 mg/dL (ref 6–20)
CO2: 21 mmol/L — ABNORMAL LOW (ref 22–32)
Calcium: 9.4 mg/dL (ref 8.9–10.3)
Chloride: 102 mmol/L (ref 98–111)
Creatinine, Ser: 0.78 mg/dL (ref 0.61–1.24)
GFR calc non Af Amer: >60 mL/min (ref >60)
Glucose, Bld: 253 mg/dL — ABNORMAL HIGH (ref 70–99)
POTASSIUM: 3.7 mmol/L (ref 3.5–5.1)
SODIUM: 137 mmol/L (ref 135–145)

## 2018-06-29 LAB — BLOOD GAS, VENOUS
Acid-base deficit: 2.2 mmol/L — ABNORMAL HIGH (ref 0.0–2.0)
BICARBONATE: 21.7 mmol/L (ref 20.0–28.0)
O2 Saturation: 68.9 %
PH VEN: 7.392 (ref 7.250–7.430)
PO2 VEN: 37.3 mmHg (ref 32.0–45.0)
Patient temperature: 98.6
pCO2, Ven: 36.4 mmHg — ABNORMAL LOW (ref 44.0–60.0)

## 2018-06-29 LAB — CBC WITH DIFFERENTIAL/PLATELET
ABS IMMATURE GRANULOCYTES: 0.06 10*3/uL (ref 0.00–0.07)
BASOS ABS: 0 10*3/uL (ref 0.0–0.1)
Basophils Relative: 0 %
Eosinophils Absolute: 0 10*3/uL (ref 0.0–0.5)
Eosinophils Relative: 0 %
HEMATOCRIT: 42.6 % (ref 39.0–52.0)
HEMOGLOBIN: 14.8 g/dL (ref 13.0–17.0)
Immature Granulocytes: 1 %
LYMPHS ABS: 1.1 10*3/uL (ref 0.7–4.0)
LYMPHS PCT: 11 %
MCH: 30.2 pg (ref 26.0–34.0)
MCHC: 34.7 g/dL (ref 30.0–36.0)
MCV: 86.9 fL (ref 80.0–100.0)
MONO ABS: 0.1 10*3/uL (ref 0.1–1.0)
Monocytes Relative: 1 %
NEUTROS ABS: 9.2 10*3/uL — AB (ref 1.7–7.7)
Neutrophils Relative %: 87 %
Platelets: 258 10*3/uL (ref 150–400)
RBC: 4.9 MIL/uL (ref 4.22–5.81)
RDW: 12 % (ref 11.5–15.5)
WBC: 10.5 10*3/uL (ref 4.0–10.5)
nRBC: 0 % (ref 0.0–0.2)

## 2018-06-29 LAB — CBG MONITORING, ED: Glucose-Capillary: 247 mg/dL — ABNORMAL HIGH (ref 70–99)

## 2018-06-29 MED ORDER — PROMETHAZINE HCL 25 MG/ML IJ SOLN
25.0000 mg | Freq: Once | INTRAMUSCULAR | Status: AC
  Administered 2018-06-29: 25 mg via INTRAVENOUS

## 2018-06-29 MED ORDER — SODIUM CHLORIDE 0.9 % IV BOLUS
1000.0000 mL | Freq: Once | INTRAVENOUS | Status: AC
  Administered 2018-06-29: 1000 mL via INTRAVENOUS

## 2018-06-29 MED ORDER — PROMETHAZINE HCL 25 MG/ML IJ SOLN
INTRAMUSCULAR | Status: AC
  Administered 2018-06-29: 25 mg via INTRAVENOUS
  Filled 2018-06-29: qty 1

## 2018-06-29 MED ORDER — INSULIN ASPART 100 UNIT/ML ~~LOC~~ SOLN
5.0000 [IU] | Freq: Once | SUBCUTANEOUS | Status: AC
  Administered 2018-06-29: 5 [IU] via SUBCUTANEOUS
  Filled 2018-06-29: qty 1

## 2018-06-29 MED ORDER — PROMETHAZINE HCL 25 MG RE SUPP: 25.0000 mg | Freq: Four times a day (QID) | RECTAL | 0 refills | Status: DC | PRN

## 2018-06-29 MED ORDER — ONDANSETRON HCL 4 MG/2ML IJ SOLN
4.0000 mg | Freq: Once | INTRAMUSCULAR | Status: AC
  Administered 2018-06-29: 4 mg via INTRAVENOUS
  Filled 2018-06-29: qty 2

## 2018-06-29 NOTE — ED Provider Notes (Signed)
[facility]-EMERGENCY DEPT Provider Note: Lowella DellJ. Lane Marcos Ruelas, MD, FACEP  CSN: [id number] MRN: [id number] ARRIVAL: 06/29/18 at 0037 ROOM: WA21/[facility]21   CHIEF COMPLAINT  Vomiting   HISTORY OF PRESENT ILLNESS  06/29/18 1:59 AM Mason Mclaughlin is a 23 y.o. male with type 1 diabetes.  He is here with 6 hours of nausea and vomiting.  He attributes this to eating a hamburger with bacon.  He has had some associated abdominal cramping which is not severe.  He denies diarrhea.   Past Medical History:  Diagnosis Date  . Diabetes (HCC)   . DKA (diabetic ketoacidoses) (HCC)     History reviewed. No pertinent surgical history.  Family History  Family history unknown: Yes    Social History   Tobacco Use  . Smoking status: Never Smoker  . Smokeless tobacco: Never Used  Substance Use Topics  . Alcohol use: Yes  . Drug use: Yes    Frequency: 4.0 times per week    Types: Marijuana    Prior to Admission medications   Medication Sig Start Date End Date Taking? Authorizing Provider  acetaminophen (TYLENOL) 500 MG tablet Take 1,500 mg by mouth daily as needed for moderate pain.    Yes [provider]    Allergies Patient has no known allergies.   REVIEW OF SYSTEMS  Negative except as noted here or in the History of Present Illness.   PHYSICAL EXAMINATION  Initial Vital Signs Blood pressure (!) 154/84, pulse 96, temperature 99.3 F (37.4 C), temperature source Oral, resp. rate 18, SpO2 100 %.  Examination General: Well-developed, well-nourished male in no acute distress; appearance consistent with age of record HENT: normocephalic; atraumatic; breath nonketotic Eyes: pupils equal, round and reactive to light; extraocular muscles intact Neck: supple Heart: regular rate and rhythm Lungs: clear to auscultation bilaterally Abdomen: soft; nondistended; mild lower abdominal tenderness; no masses or hepatosplenomegaly; bowel sounds present Extremities: No deformity; full range of  motion; pulses normal Neurologic: Awake, alert and oriented; motor function intact in all extremities and symmetric; no facial droop Skin: Warm and dry Psychiatric: Normal mood and affect   RESULTS  Summary of this visit's results, reviewed by myself:   EKG Interpretation  Date/Time:    Ventricular Rate:    PR Interval:    QRS Duration:   QT Interval:    QTC Calculation:   R Axis:     Text Interpretation:        Laboratory Studies: Results for orders placed or performed during the hospital encounter of 06/29/18 (from the past 24 hour(s))  CBG monitoring, ED     Status: Abnormal   Collection Time: 06/29/18  1:50 AM  Result Value Ref Range   Glucose-Capillary 247 (H) 70 - 99 mg/dL   Comment 1 Notify RN    Comment 2 Document in Chart   CBC with Differential/Platelet     Status: Abnormal   Collection Time: 06/29/18  2:01 AM  Result Value Ref Range   WBC 10.5 4.0 - 10.5 K/uL   RBC 4.90 4.22 - 5.81 MIL/uL   Hemoglobin 14.8 13.0 - 17.0 g/dL   HCT 78.242.6 95.639.0 - 21.352.0 %   MCV 86.9 80.0 - 100.0 fL   MCH 30.2 26.0 - 34.0 pg   MCHC 34.7 30.0 - 36.0 g/dL   RDW 08.612.0 57.811.5 - 46.915.5 %   Platelets 258 150 - 400 K/uL   nRBC 0.0 0.0 - 0.2 %   Neutrophils Relative % 87 %   Neutro Abs 9.2 (  H) 1.7 - 7.7 K/uL   Lymphocytes Relative 11 %   Lymphs Abs 1.1 0.7 - 4.0 K/uL   Monocytes Relative 1 %   Monocytes Absolute 0.1 0.1 - 1.0 K/uL   Eosinophils Relative 0 %   Eosinophils Absolute 0.0 0.0 - 0.5 K/uL   Basophils Relative 0 %   Basophils Absolute 0.0 0.0 - 0.1 K/uL   Immature Granulocytes 1 %   Abs Immature Granulocytes 0.06 0.00 - 0.07 K/uL  Basic metabolic panel     Status: Abnormal   Collection Time: 06/29/18  2:01 AM  Result Value Ref Range   Sodium 137 135 - 145 mmol/L   Potassium 3.7 3.5 - 5.1 mmol/L   Chloride 102 98 - 111 mmol/L   CO2 21 (L) 22 - 32 mmol/L   Glucose, Bld 253 (H) 70 - 99 mg/dL   BUN 13 6 - 20 mg/dL   Creatinine, Ser 9.560.78 0.61 - 1.24 mg/dL   Calcium 9.4 8.9 -  21.310.3 mg/dL   GFR calc non Af Amer >60 >60 mL/min   GFR calc Af Amer >60 >60 mL/min   Anion gap 14 5 - 15  Blood gas, venous     Status: Abnormal   Collection Time: 06/29/18  2:18 AM  Result Value Ref Range   pH, Ven 7.392 7.250 - 7.430   pCO2, Ven 36.4 (L) 44.0 - 60.0 mmHg   pO2, Ven 37.3 32.0 - 45.0 mmHg   Bicarbonate 21.7 20.0 - 28.0 mmol/L   Acid-base deficit 2.2 (H) 0.0 - 2.0 mmol/L   O2 Saturation 68.9 %   Patient temperature 98.6    Collection site VEIN    Drawn by COLLECTED BY NURSE    Sample type VEIN    Imaging Studies: No results found.  ED COURSE and MDM  Nursing notes and initial vitals signs, including pulse oximetry, reviewed.  Vitals:   06/29/18 0102 06/29/18 0300 06/29/18 0400 06/29/18 0500  BP: (!) 154/84 139/89 130/89 138/85  Pulse: 96 (!) 102 94 95  Resp: 18 17 16 17   Temp: 99.3 F (37.4 C)     TempSrc: Oral     SpO2: 100% 99% 99% 99%   5:35 AM Patient feeling better after IV fluids, Zofran and Phenergan.  He has been drinking ginger ale without further emesis.  He is requesting Phenergan suppositories for home use.  He does not appear to be in diabetic ketoacidosis.  PROCEDURES    ED DIAGNOSES     ICD-10-CM   1. Nausea and vomiting in adult R11.2        Mickie Kozikowski, Jonny RuizJohn, MD 06/29/18 [phone]

## 2018-06-29 NOTE — ED Triage Notes (Signed)
Pt complains of vomiting for six hours, he states that he ate a hamburger last night with bacon and he doesn't eat bacon Pt is also an insulin dependent diabetic

## 2018-06-29 NOTE — ED Notes (Signed)
CBG 247 

## 2018-06-29 NOTE — ED Notes (Signed)
Patient reminded that urine sample is needed. Urinal at bedside.  

## 2018-11-26 ENCOUNTER — Other Ambulatory Visit: Payer: Self-pay

## 2018-11-26 ENCOUNTER — Encounter (HOSPITAL_COMMUNITY): Payer: Self-pay

## 2018-11-26 ENCOUNTER — Emergency Department (HOSPITAL_COMMUNITY)
Admission: EM | Admit: 2018-11-26 | Discharge: 2018-11-26 | Disposition: A | Discharge status: Home / Self Care | Payer: BC Managed Care – PPO | Attending: Emergency Medicine | Admitting: Emergency Medicine

## 2018-11-26 DIAGNOSIS — R1084 Generalized abdominal pain: Secondary | ICD-10-CM | POA: Diagnosis present

## 2018-11-26 NOTE — ED Triage Notes (Signed)
Pt states that he thinks he is dehydrated. Pt c/o generalized abd pain and emesis x 2 days.

## 2019-02-13 ENCOUNTER — Emergency Department (HOSPITAL_COMMUNITY)
Admission: EM | Admit: 2019-02-13 | Discharge: 2019-02-14 | Discharge status: Against Medical Advice | Payer: BC Managed Care – PPO | Attending: Emergency Medicine | Admitting: Emergency Medicine

## 2019-02-13 ENCOUNTER — Other Ambulatory Visit: Payer: Self-pay

## 2019-02-13 ENCOUNTER — Encounter (HOSPITAL_COMMUNITY): Payer: Self-pay

## 2019-02-13 DIAGNOSIS — Z5321 Procedure and treatment not carried out due to patient leaving prior to being seen by health care provider: Secondary | ICD-10-CM | POA: Diagnosis not present

## 2019-02-13 DIAGNOSIS — R1110 Vomiting, unspecified: Secondary | ICD-10-CM | POA: Diagnosis present

## 2019-02-13 HISTORY — DX: Type 1 diabetes mellitus with ketoacidosis without coma: E10.10

## 2019-02-13 HISTORY — DX: Presence of insulin pump (external) (internal): Z96.41

## 2019-02-13 LAB — URINALYSIS, ROUTINE W REFLEX MICROSCOPIC
Bacteria, UA: NONE SEEN
Bilirubin Urine: NEGATIVE
Glucose, UA: >=500 mg/dL — AB
Hgb urine dipstick: NEGATIVE
Ketones, ur: 80 mg/dL — AB
Leukocytes,Ua: NEGATIVE
Nitrite: NEGATIVE
Protein, ur: 30 mg/dL — AB
Specific Gravity, Urine: 1.039 — ABNORMAL HIGH (ref 1.005–1.030)
pH: 6 (ref 5.0–8.0)

## 2019-02-13 LAB — CBG MONITORING, ED: Glucose-Capillary: 312 mg/dL — ABNORMAL HIGH (ref 70–99)

## 2019-02-13 MED ORDER — SODIUM CHLORIDE 0.9% FLUSH: 3.0000 mL | Freq: Once | INTRAVENOUS | Status: DC

## 2019-02-13 NOTE — ED Notes (Signed)
[place] TRIAGE RN MADE AWARE.

## 2019-02-13 NOTE — ED Notes (Signed)
CALLED. UNABLE TO LOCATE X 2

## 2019-02-13 NOTE — ED Notes (Signed)
CALLED PT FOR ROOM. UNABLE TO LOCATE X 1

## 2019-02-13 NOTE — ED Notes (Signed)
PT IS A TYPE -1 DIABETIC. CURRENTLY HAS INSULIN PUMP INFUSING. PT ENDORSED LAST DKA EPISODE WAS JULY

## 2019-02-13 NOTE — ED Notes (Signed)
UPDATED ON PLAN OF CARE AND BED STATUS. NO ACUTE DISTRESS. [place] WITH CARE

## 2019-02-13 NOTE — ED Triage Notes (Signed)
Pt states he has had emesis x 3 days. Pt states that yesterday he did not have emesis, but does today.

## 2019-04-20 ENCOUNTER — Other Ambulatory Visit: Payer: Self-pay

## 2019-04-20 ENCOUNTER — Emergency Department (HOSPITAL_COMMUNITY)
Admission: EM | Admit: 2019-04-20 | Discharge: 2019-04-20 | Disposition: A | Discharge status: Home / Self Care | Payer: BC Managed Care – PPO | Attending: Emergency Medicine | Admitting: Emergency Medicine

## 2019-04-20 ENCOUNTER — Encounter (HOSPITAL_COMMUNITY): Payer: Self-pay

## 2019-04-20 ENCOUNTER — Emergency Department (HOSPITAL_COMMUNITY): Payer: BC Managed Care – PPO

## 2019-04-20 DIAGNOSIS — E1065 Type 1 diabetes mellitus with hyperglycemia: Secondary | ICD-10-CM | POA: Diagnosis not present

## 2019-04-20 DIAGNOSIS — Z9641 Presence of insulin pump (external) (internal): Secondary | ICD-10-CM | POA: Insufficient documentation

## 2019-04-20 DIAGNOSIS — R112 Nausea with vomiting, unspecified: Secondary | ICD-10-CM | POA: Diagnosis not present

## 2019-04-20 DIAGNOSIS — R079 Chest pain, unspecified: Secondary | ICD-10-CM

## 2019-04-20 DIAGNOSIS — R739 Hyperglycemia, unspecified: Secondary | ICD-10-CM

## 2019-04-20 LAB — URINALYSIS, ROUTINE W REFLEX MICROSCOPIC
Bacteria, UA: NONE SEEN
Bilirubin Urine: NEGATIVE
Glucose, UA: >=500 mg/dL — AB
Hgb urine dipstick: NEGATIVE
Ketones, ur: 80 mg/dL — AB
Leukocytes,Ua: NEGATIVE
Nitrite: NEGATIVE
Protein, ur: 30 mg/dL — AB
Specific Gravity, Urine: 1.027 (ref 1.005–1.030)
pH: 6 (ref 5.0–8.0)

## 2019-04-20 LAB — I-STAT CHEM 8, ED
BUN: 11 mg/dL (ref 6–20)
Calcium, Ion: 1.11 mmol/L — ABNORMAL LOW (ref 1.15–1.40)
Chloride: 97 mmol/L — ABNORMAL LOW (ref 98–111)
Creatinine, Ser: 0.7 mg/dL (ref 0.61–1.24)
Glucose, Bld: 202 mg/dL — ABNORMAL HIGH (ref 70–99)
HCT: 41 % (ref 39.0–52.0)
Hemoglobin: 13.9 g/dL (ref 13.0–17.0)
Potassium: 3.4 mmol/L — ABNORMAL LOW (ref 3.5–5.1)
Sodium: 136 mmol/L (ref 135–145)
TCO2: 23 mmol/L (ref 22–32)

## 2019-04-20 LAB — COMPREHENSIVE METABOLIC PANEL
ALT: 34 U/L (ref 0–44)
AST: 22 U/L (ref 15–41)
Albumin: 4.2 g/dL (ref 3.5–5.0)
Alkaline Phosphatase: 94 U/L (ref 38–126)
Anion gap: 18 — ABNORMAL HIGH (ref 5–15)
BUN: 14 mg/dL (ref 6–20)
CO2: 19 mmol/L — ABNORMAL LOW (ref 22–32)
Calcium: 8.9 mg/dL (ref 8.9–10.3)
Chloride: 96 mmol/L — ABNORMAL LOW (ref 98–111)
Creatinine, Ser: 0.96 mg/dL (ref 0.61–1.24)
GFR calc Af Amer: >60 mL/min (ref >60)
GFR calc non Af Amer: >60 mL/min (ref >60)
Glucose, Bld: 231 mg/dL — ABNORMAL HIGH (ref 70–99)
Potassium: 3.4 mmol/L — ABNORMAL LOW (ref 3.5–5.1)
Sodium: 133 mmol/L — ABNORMAL LOW (ref 135–145)
Total Bilirubin: 1.9 mg/dL — ABNORMAL HIGH (ref 0.3–1.2)
Total Protein: 7.6 g/dL (ref 6.5–8.1)

## 2019-04-20 LAB — BLOOD GAS, VENOUS
Acid-Base Excess: 0.8 mmol/L (ref 0.0–2.0)
Acid-Base Excess: 0.2 mmol/L (ref 0.0–2.0)
Bicarbonate: 22.2 mmol/L (ref 20.0–28.0)
Bicarbonate: 23.8 mmol/L (ref 20.0–28.0)
O2 Saturation: 80.5 %
O2 Saturation: 79.7 %
Patient temperature: 98.6
Patient temperature: 98.6
pCO2, Ven: 28.3 mmHg — ABNORMAL LOW (ref 44.0–60.0)
pCO2, Ven: 36.9 mmHg — ABNORMAL LOW (ref 44.0–60.0)
pH, Ven: 7.506 — ABNORMAL HIGH (ref 7.250–7.430)
pH, Ven: 7.425 (ref 7.250–7.430)
pO2, Ven: 42.2 mmHg (ref 32.0–45.0)
pO2, Ven: 46 mmHg — ABNORMAL HIGH (ref 32.0–45.0)

## 2019-04-20 LAB — CBC
HCT: 44 % (ref 39.0–52.0)
Hemoglobin: 15.3 g/dL (ref 13.0–17.0)
MCH: 29.7 pg (ref 26.0–34.0)
MCHC: 34.8 g/dL (ref 30.0–36.0)
MCV: 85.3 fL (ref 80.0–100.0)
Platelets: 295 10*3/uL (ref 150–400)
RBC: 5.16 MIL/uL (ref 4.22–5.81)
RDW: 11.9 % (ref 11.5–15.5)
WBC: 12 10*3/uL — ABNORMAL HIGH (ref 4.0–10.5)
nRBC: 0 % (ref 0.0–0.2)

## 2019-04-20 LAB — CBG MONITORING, ED
Glucose-Capillary: 212 mg/dL — ABNORMAL HIGH (ref 70–99)
Glucose-Capillary: 194 mg/dL — ABNORMAL HIGH (ref 70–99)
Glucose-Capillary: 292 mg/dL — ABNORMAL HIGH (ref 70–99)

## 2019-04-20 LAB — TROPONIN I (HIGH SENSITIVITY)
Troponin I (High Sensitivity): 4 ng/L (ref <18)
Troponin I (High Sensitivity): 5 ng/L (ref <18)

## 2019-04-20 LAB — LIPASE, BLOOD: Lipase: 21 U/L (ref 11–51)

## 2019-04-20 MED ORDER — ONDANSETRON HCL 4 MG/2ML IJ SOLN
4.00 | INTRAMUSCULAR | Status: DC
Start: ? — End: 2019-04-20

## 2019-04-20 MED ORDER — SODIUM CHLORIDE 0.9 % IV BOLUS: 1000.0000 mL | Freq: Once | INTRAVENOUS | Status: DC

## 2019-04-20 MED ORDER — POTASSIUM CHLORIDE CRYS ER 20 MEQ PO TBCR
30.0000 meq | EXTENDED_RELEASE_TABLET | Freq: Once | ORAL | Status: AC
  Administered 2019-04-20: 30 meq via ORAL
  Filled 2019-04-20: qty 1

## 2019-04-20 MED ORDER — GENERIC EXTERNAL MEDICATION
5.00 | Status: DC
Start: ? — End: 2019-04-20

## 2019-04-20 MED ORDER — METOCLOPRAMIDE HCL 5 MG/ML IJ SOLN
10.00 | INTRAMUSCULAR | Status: DC
Start: ? — End: 2019-04-20

## 2019-04-20 MED ORDER — GENERIC EXTERNAL MEDICATION
5.00 | Status: DC
Start: 2019-04-18 — End: 2019-04-20

## 2019-04-20 MED ORDER — ONDANSETRON HCL 4 MG/2ML IJ SOLN
4.0000 mg | Freq: Once | INTRAMUSCULAR | Status: AC
  Administered 2019-04-20: 4 mg via INTRAVENOUS
  Filled 2019-04-20: qty 2

## 2019-04-20 MED ORDER — SODIUM CHLORIDE 0.9 % IV BOLUS
1000.0000 mL | Freq: Once | INTRAVENOUS | Status: AC
  Administered 2019-04-20: 1000 mL via INTRAVENOUS

## 2019-04-20 MED ORDER — LIDOCAINE HCL (PF) 1 % IJ SOLN
0.50 | INTRAMUSCULAR | Status: DC
Start: ? — End: 2019-04-20

## 2019-04-20 MED ORDER — LACTATED RINGERS IV SOLN
INTRAVENOUS | Status: DC
Start: ? — End: 2019-04-20

## 2019-04-20 MED ORDER — SODIUM CHLORIDE 0.9% FLUSH
3.0000 mL | Freq: Once | INTRAVENOUS | Status: AC
  Administered 2019-04-20: 3 mL via INTRAVENOUS

## 2019-04-20 MED ORDER — LORAZEPAM 2 MG/ML IJ SOLN
0.5000 mg | Freq: Once | INTRAMUSCULAR | Status: AC
  Administered 2019-04-20: 0.5 mg via INTRAVENOUS
  Filled 2019-04-20: qty 1

## 2019-04-20 NOTE — ED Notes (Signed)
Urinal at bedside. Pt has been made aware that MD needs UA sample. 

## 2019-04-20 NOTE — Discharge Instructions (Addendum)
Continue taking the nausea medication that you were discharge with when you were discharged from [facility].   Please follow up with your primary doctor within the next 5-7 days.  If you do not have a primary care provider, information for a healthcare clinic has been provided for you to make arrangements for follow up care. Please return to the ER sooner if you have any new or worsening symptoms, or if you have any of the following symptoms:  Abdominal pain that does not go away.  You have a fever.  You keep throwing up (vomiting).  The pain is felt only in portions of the abdomen. Pain in the right side could possibly be appendicitis. In an adult, pain in the left lower portion of the abdomen could be colitis or diverticulitis.  You pass bloody or black tarry stools.  There is bright red blood in the stool.  The constipation stays for more than 4 days.  There is belly (abdominal) or rectal pain.  You do not seem to be getting better.  You have any questions or concerns.

## 2019-04-20 NOTE — ED Notes (Addendum)
Pt made aware urine needed. Pt unable to void at this time.  

## 2019-04-20 NOTE — ED Notes (Addendum)
Per PA: Pt turned off insulin pump at this time. Will recheck CBG shortly

## 2019-04-20 NOTE — ED Triage Notes (Signed)
Pt presents with c/o vomiting and chest pain. Pt reports he has been vomiting for 4 days. Pt reports he was at [place] 2 days ago, borderline DKA. Pt reports he is a diabetic. Pt reports the chest pain started today, believes he is "breathing too hard".

## 2019-04-20 NOTE — ED Provider Notes (Signed)
[facility]-EMERGENCY DEPT Provider Note   CSN: [id number] Arrival date & time: 04/20/19  1122     History   Chief Complaint Chief Complaint  Patient presents with  . Emesis  . Chest Pain    HPI Mason Mclaughlin is a 23 y.o. male.     HPI   Pt is a 23 y/o make with a h/o T1DM who presents to the ED today for eval of nausea and vomiting x4 days. He reports associated epigastric abd pain that is mild. He denies fevers, diarrhea, constipation. He reports mild midsternal chest pain as well that started after he had been vomiting. He reports mild SOB. He denies cough. Denies any known COVID exposures.   States he was recently admitted to [facility] with borderline DKA. He was discharged 2 days ago.   Pt initially denied etoh or drug use however later admitted to etoh use 4 days ago. He also reports intermittent thc use.  Past Medical History:  Diagnosis Date  . Diabetes (HCC)   . DKA (diabetic ketoacidoses) (HCC)   . DKA, type 1 (HCC)   . Insulin pump in place     Patient Active Problem List   Diagnosis Date Noted  . ARF (acute renal failure) (HCC) 04/17/2018  . AKI (acute kidney injury) (HCC)   . Nausea and vomiting   . DKA (diabetic ketoacidoses) (HCC) 05/29/2017    History reviewed. No pertinent surgical history.      Home Medications    Prior to Admission medications   Medication Sig Start Date End Date Taking? Authorizing Provider  insulin aspart (NOVOLOG) 100 UNIT/ML injection Inject 0-60 Units into the skin continuous.  04/04/19  Yes [provider]  promethazine (PHENERGAN) 25 MG suppository Place 1 suppository (25 mg total) rectally every 6 (six) hours as needed for nausea or vomiting. Patient not taking: Reported on 04/20/2019 06/29/18   Molpus, Jonny Ruiz, MD    Family History Family History  Family history unknown: Yes    Social History Social History   Tobacco Use  . Smoking status: Never Smoker  . Smokeless tobacco: Never  Used  Substance Use Topics  . Alcohol use: Yes  . Drug use: Yes    Frequency: 4.0 times per week    Types: Marijuana    Allergies   Patient has no known allergies.   Review of Systems Review of Systems  Constitutional: Negative for fever.  HENT: Negative for ear pain and sore throat.   Eyes: Negative for visual disturbance.  Respiratory: Negative for cough and shortness of breath.   Cardiovascular: Negative for chest pain.  Gastrointestinal: Positive for abdominal pain, nausea and vomiting. Negative for constipation and diarrhea.  Genitourinary: Negative for dysuria and hematuria.  Musculoskeletal: Negative for back pain.  Skin: Negative for rash.  Neurological: Negative for headaches.  All other systems reviewed and are negative.    Physical Exam Updated Vital Signs BP 120/79 (BP Location: Right Arm)   Pulse 96   Temp (!) 97.3 F (36.3 C) (Oral)   Resp (!) 21   Ht 5\' 5"  (1.651 m)   Wt 63.5 kg   SpO2 99%   BMI 23.30 kg/m   Physical Exam Vitals signs and nursing note reviewed.  Constitutional:      Appearance: He is well-developed.  HENT:     Head: Normocephalic and atraumatic.     Mouth/Throat:     Mouth: Mucous membranes are dry.  Eyes:     Conjunctiva/sclera: Conjunctivae normal.  Neck:     Musculoskeletal: Neck supple.  Cardiovascular:     Rate and Rhythm: Normal rate and regular rhythm.     Heart sounds: Normal heart sounds. No murmur.  Pulmonary:     Effort: Pulmonary effort is normal. Tachypnea (mild, seems to be hyperventilating ) present. No respiratory distress.     Breath sounds: Normal breath sounds. No decreased breath sounds, wheezing, rhonchi or rales.  Abdominal:     Palpations: Abdomen is soft.     Tenderness: There is no abdominal tenderness.     Comments: Actively dry heaving on exam  Skin:    General: Skin is warm and dry.  Neurological:     Mental Status: He is alert.      ED Treatments / Results  Labs (all labs ordered are  listed, but only abnormal results are displayed) Labs Reviewed  CBC - Abnormal; Notable for the following components:      Result Value   WBC 12.0 (*)    All other components within normal limits  COMPREHENSIVE METABOLIC PANEL - Abnormal; Notable for the following components:   Sodium 133 (*)    Potassium 3.4 (*)    Chloride 96 (*)    CO2 19 (*)    Glucose, Bld 231 (*)    Total Bilirubin 1.9 (*)    Anion gap 18 (*)    All other components within normal limits  URINALYSIS, ROUTINE W REFLEX MICROSCOPIC - Abnormal; Notable for the following components:   Glucose, UA >=500 (*)    Ketones, ur 80 (*)    Protein, ur 30 (*)    All other components within normal limits  BLOOD GAS, VENOUS - Abnormal; Notable for the following components:   pH, Ven 7.506 (*)    pCO2, Ven 28.3 (*)    All other components within normal limits  BLOOD GAS, VENOUS - Abnormal; Notable for the following components:   pCO2, Ven 36.9 (*)    pO2, Ven 46.0 (*)    All other components within normal limits  CBG MONITORING, ED - Abnormal; Notable for the following components:   Glucose-Capillary 212 (*)    All other components within normal limits  CBG MONITORING, ED - Abnormal; Notable for the following components:   Glucose-Capillary 194 (*)    All other components within normal limits  I-STAT CHEM 8, ED - Abnormal; Notable for the following components:   Potassium 3.4 (*)    Chloride 97 (*)    Glucose, Bld 202 (*)    Calcium, Ion 1.11 (*)    All other components within normal limits  CBG MONITORING, ED - Abnormal; Notable for the following components:   Glucose-Capillary 292 (*)    All other components within normal limits  LIPASE, BLOOD  TROPONIN I (HIGH SENSITIVITY)  TROPONIN I (HIGH SENSITIVITY)    EKG EKG Interpretation  Date/Time:  Friday April 20 2019 11:33:44 EST Ventricular Rate:  72 PR Interval:    QRS Duration: 78 QT Interval:  411 QTC Calculation: 450 R Axis:   70 Text  Interpretation: Sinus arrhythmia Short PR interval LVH by voltage st elevation v3 and biphasic twave v1-v3 new from first prior ekg Confirmed by Pattricia Boss [phone]) on 04/20/2019 1:27:46 PM   Radiology Dg Chest Port 1 View  Result Date: 04/20/2019 CLINICAL DATA:  Vomiting, chest pain EXAM: PORTABLE CHEST 1 VIEW COMPARISON:  03/16/2018 FINDINGS: The heart size and mediastinal contours are within normal limits. Both lungs are clear. The visualized skeletal  structures are unremarkable. IMPRESSION: No acute abnormality of the lungs in AP portable projection. Electronically Signed   By: Lauralyn PrimesAlex  Bibbey M.D.   On: 04/20/2019 12:01    Procedures Procedures (including critical care time)  Medications Ordered in ED Medications  sodium chloride flush (NS) 0.9 % injection 3 mL (3 mLs Intravenous Given 04/20/19 1203)  sodium chloride 0.9 % bolus 1,000 mL (0 mLs Intravenous Stopped 04/20/19 1946)  ondansetron (ZOFRAN) injection 4 mg (4 mg Intravenous Given 04/20/19 1200)  potassium chloride (KLOR-CON) CR tablet 30 mEq (30 mEq Oral Given 04/20/19 1353)  LORazepam (ATIVAN) injection 0.5 mg (0.5 mg Intravenous Given 04/20/19 1353)  sodium chloride 0.9 % bolus 1,000 mL (1,000 mLs Intravenous Bolus 04/20/19 1445)     Initial Impression / Assessment and Plan / ED Course  I have reviewed the triage vital signs and the nursing notes.  Pertinent labs & imaging results that were available during my care of the patient were reviewed by me and considered in my medical decision making (see chart for details).   Final Clinical Impressions(s) / ED Diagnoses   Final diagnoses:  Hyperglycemia  Non-intractable vomiting with nausea, unspecified vomiting type   23 y/o M with h/o type 1 DM presenting with 4 day h/o NV. Has not been able to eat/drink during this time. Recent admission for DKA a few days ago. Here with persistent sxs. No infectious sxs.   Initial CBG in low 200s. Pt started in IVF and given  antiemetics.   CBC with mild leukocytosis, no anemia CMP with mild hyponatremia/hypokalemia. Also with low bicarb at 19 and elevated anion gap at 18  Lipase negative Trop negative x2 VBG with alkalosis, with pH of 7.5, pCO2 low, bicarbonate wnl UA with glucosuria and ketonuria Repeat vbg with normal ph at 7.25, pco2 improving.   - vbg seems more consistent with a respiratory alkalosis likely from hyperventilation and improved after ativan so I question a component of anxiety Repeat chem 8 with improved bicarb, anion gap improving as well.   EKG with sinus arrhythmia, short PR interval, LVH by voltage.   CXR w/o acute findings.   Given pt does not have acidosis and gap seems to be closing, have lower suspicion for DKA at this time. His labs seem to be improving and on reassessment he states he feels improved. He denies chest pain, sob, continued nv. He has been able to tolerate po. He feels comfortable with the plan to go home and restart his insulin pump and continue to hydrate. He received rx for zofran a few days ago and it was just filled today. Advised on f/u and return precautions. pts significant other voices understanding.  Pt seen in conjunction with Dr. Rosalia Hammersay who personally evaluated the patient and is in agreement with the plan for discharge.   I was informed by nursing staff after the patient was discharged and had left the department that his cbg was rechecked and was 292.  ED Discharge Orders    None       Rayne DuCouture, Sharece Fleischhacker S, PA-C 04/20/19 2100    Margarita Grizzleay, Danielle, MD 04/21/19 1616

## 2019-07-06 ENCOUNTER — Other Ambulatory Visit: Payer: Self-pay

## 2019-07-06 ENCOUNTER — Encounter (HOSPITAL_COMMUNITY): Payer: Self-pay

## 2019-07-06 ENCOUNTER — Inpatient Hospital Stay (HOSPITAL_COMMUNITY)
Admission: EM | Admit: 2019-07-06 | Discharge: 2019-07-08 | DRG: 639 | Disposition: A | Discharge status: Home / Self Care | Payer: BC Managed Care – PPO | Attending: Internal Medicine | Admitting: Internal Medicine

## 2019-07-06 DIAGNOSIS — Z9641 Presence of insulin pump (external) (internal): Secondary | ICD-10-CM | POA: Diagnosis present

## 2019-07-06 DIAGNOSIS — Z20822 Contact with and (suspected) exposure to covid-19: Secondary | ICD-10-CM | POA: Diagnosis present

## 2019-07-06 DIAGNOSIS — Z794 Long term (current) use of insulin: Secondary | ICD-10-CM

## 2019-07-06 DIAGNOSIS — Z9114 Patient's other noncompliance with medication regimen: Secondary | ICD-10-CM | POA: Diagnosis not present

## 2019-07-06 DIAGNOSIS — R030 Elevated blood-pressure reading, without diagnosis of hypertension: Secondary | ICD-10-CM

## 2019-07-06 DIAGNOSIS — I10 Essential (primary) hypertension: Secondary | ICD-10-CM | POA: Diagnosis present

## 2019-07-06 DIAGNOSIS — E1010 Type 1 diabetes mellitus with ketoacidosis without coma: Principal | ICD-10-CM | POA: Diagnosis present

## 2019-07-06 DIAGNOSIS — R739 Hyperglycemia, unspecified: Secondary | ICD-10-CM

## 2019-07-06 DIAGNOSIS — Z833 Family history of diabetes mellitus: Secondary | ICD-10-CM | POA: Diagnosis not present

## 2019-07-06 DIAGNOSIS — E1065 Type 1 diabetes mellitus with hyperglycemia: Secondary | ICD-10-CM

## 2019-07-06 DIAGNOSIS — R112 Nausea with vomiting, unspecified: Secondary | ICD-10-CM | POA: Diagnosis not present

## 2019-07-06 LAB — URINALYSIS, ROUTINE W REFLEX MICROSCOPIC
Bacteria, UA: NONE SEEN
Bilirubin Urine: NEGATIVE
Glucose, UA: >=500 mg/dL — AB
Hgb urine dipstick: NEGATIVE
Ketones, ur: 80 mg/dL — AB
Leukocytes,Ua: NEGATIVE
Nitrite: NEGATIVE
Protein, ur: NEGATIVE mg/dL
Specific Gravity, Urine: 1.029 (ref 1.005–1.030)
pH: 6 (ref 5.0–8.0)

## 2019-07-06 LAB — COMPREHENSIVE METABOLIC PANEL
ALT: 31 U/L (ref 0–44)
AST: 24 U/L (ref 15–41)
Albumin: 4.5 g/dL (ref 3.5–5.0)
Alkaline Phosphatase: 101 U/L (ref 38–126)
Anion gap: 21 — ABNORMAL HIGH (ref 5–15)
BUN: 18 mg/dL (ref 6–20)
CO2: 17 mmol/L — ABNORMAL LOW (ref 22–32)
Calcium: 9.3 mg/dL (ref 8.9–10.3)
Chloride: 94 mmol/L — ABNORMAL LOW (ref 98–111)
Creatinine, Ser: 0.92 mg/dL (ref 0.61–1.24)
GFR calc Af Amer: >60 mL/min (ref >60)
GFR calc non Af Amer: >60 mL/min (ref >60)
Glucose, Bld: 364 mg/dL — ABNORMAL HIGH (ref 70–99)
Potassium: 3.6 mmol/L (ref 3.5–5.1)
Sodium: 132 mmol/L — ABNORMAL LOW (ref 135–145)
Total Bilirubin: 1.5 mg/dL — ABNORMAL HIGH (ref 0.3–1.2)
Total Protein: 8.3 g/dL — ABNORMAL HIGH (ref 6.5–8.1)

## 2019-07-06 LAB — BASIC METABOLIC PANEL
Anion gap: 13 (ref 5–15)
Anion gap: 17 — ABNORMAL HIGH (ref 5–15)
BUN: 13 mg/dL (ref 6–20)
BUN: 14 mg/dL (ref 6–20)
CO2: 21 mmol/L — ABNORMAL LOW (ref 22–32)
CO2: 19 mmol/L — ABNORMAL LOW (ref 22–32)
Calcium: 8.4 mg/dL — ABNORMAL LOW (ref 8.9–10.3)
Calcium: 8.5 mg/dL — ABNORMAL LOW (ref 8.9–10.3)
Chloride: 101 mmol/L (ref 98–111)
Chloride: 101 mmol/L (ref 98–111)
Creatinine, Ser: 0.88 mg/dL (ref 0.61–1.24)
Creatinine, Ser: 0.85 mg/dL (ref 0.61–1.24)
GFR calc Af Amer: >60 mL/min (ref >60)
GFR calc Af Amer: >60 mL/min (ref >60)
GFR calc non Af Amer: >60 mL/min (ref >60)
GFR calc non Af Amer: >60 mL/min (ref >60)
Glucose, Bld: 209 mg/dL — ABNORMAL HIGH (ref 70–99)
Glucose, Bld: 282 mg/dL — ABNORMAL HIGH (ref 70–99)
Potassium: 4.8 mmol/L (ref 3.5–5.1)
Potassium: 4 mmol/L (ref 3.5–5.1)
Sodium: 135 mmol/L (ref 135–145)
Sodium: 137 mmol/L (ref 135–145)

## 2019-07-06 LAB — BLOOD GAS, VENOUS
Acid-base deficit: 3.2 mmol/L — ABNORMAL HIGH (ref 0.0–2.0)
Bicarbonate: 19.2 mmol/L — ABNORMAL LOW (ref 20.0–28.0)
O2 Saturation: 67 %
Patient temperature: 98.6
pCO2, Ven: 29.5 mmHg — ABNORMAL LOW (ref 44.0–60.0)
pH, Ven: 7.429 (ref 7.250–7.430)
pO2, Ven: 36.5 mmHg (ref 32.0–45.0)

## 2019-07-06 LAB — CBC
HCT: 44.3 % (ref 39.0–52.0)
Hemoglobin: 15.8 g/dL (ref 13.0–17.0)
MCH: 29.8 pg (ref 26.0–34.0)
MCHC: 35.7 g/dL (ref 30.0–36.0)
MCV: 83.6 fL (ref 80.0–100.0)
Platelets: 289 10*3/uL (ref 150–400)
RBC: 5.3 MIL/uL (ref 4.22–5.81)
RDW: 12.1 % (ref 11.5–15.5)
WBC: 13.7 10*3/uL — ABNORMAL HIGH (ref 4.0–10.5)
nRBC: 0 % (ref 0.0–0.2)

## 2019-07-06 LAB — GLUCOSE, CAPILLARY
Glucose-Capillary: 288 mg/dL — ABNORMAL HIGH (ref 70–99)
Glucose-Capillary: 262 mg/dL — ABNORMAL HIGH (ref 70–99)
Glucose-Capillary: 216 mg/dL — ABNORMAL HIGH (ref 70–99)

## 2019-07-06 LAB — I-STAT CHEM 8, ED
BUN: 20 mg/dL (ref 6–20)
Calcium, Ion: 1.05 mmol/L — ABNORMAL LOW (ref 1.15–1.40)
Chloride: 98 mmol/L (ref 98–111)
Creatinine, Ser: 0.7 mg/dL (ref 0.61–1.24)
Glucose, Bld: 299 mg/dL — ABNORMAL HIGH (ref 70–99)
HCT: 50 % (ref 39.0–52.0)
Hemoglobin: 17 g/dL (ref 13.0–17.0)
Potassium: 4.8 mmol/L (ref 3.5–5.1)
Sodium: 135 mmol/L (ref 135–145)
TCO2: 23 mmol/L (ref 22–32)

## 2019-07-06 LAB — CBG MONITORING, ED
Glucose-Capillary: 366 mg/dL — ABNORMAL HIGH (ref 70–99)
Glucose-Capillary: 177 mg/dL — ABNORMAL HIGH (ref 70–99)

## 2019-07-06 LAB — BETA-HYDROXYBUTYRIC ACID
Beta-Hydroxybutyric Acid: 5.72 mmol/L — ABNORMAL HIGH (ref 0.05–0.27)
Beta-Hydroxybutyric Acid: 2.82 mmol/L — ABNORMAL HIGH (ref 0.05–0.27)

## 2019-07-06 LAB — LIPASE, BLOOD: Lipase: 17 U/L (ref 11–51)

## 2019-07-06 MED ORDER — ENOXAPARIN SODIUM 40 MG/0.4ML ~~LOC~~ SOLN
40.0000 mg | SUBCUTANEOUS | Status: DC
  Administered 2019-07-06 – 2019-07-07 (×2): 40 mg via SUBCUTANEOUS
  Filled 2019-07-06 (×2): qty 0.4

## 2019-07-06 MED ORDER — ONDANSETRON HCL 4 MG/2ML IJ SOLN
4.0000 mg | Freq: Once | INTRAMUSCULAR | Status: AC
  Administered 2019-07-06: 17:00:00 4 mg via INTRAVENOUS
  Filled 2019-07-06: qty 2

## 2019-07-06 MED ORDER — INSULIN REGULAR(HUMAN) IN NACL 100-0.9 UT/100ML-% IV SOLN
INTRAVENOUS | Status: DC
  Administered 2019-07-06: 8.5 [IU]/h via INTRAVENOUS
  Filled 2019-07-06 (×2): qty 100

## 2019-07-06 MED ORDER — SODIUM CHLORIDE 0.9 % IV SOLN: INTRAVENOUS | Status: DC

## 2019-07-06 MED ORDER — SODIUM CHLORIDE 0.9 % IV BOLUS
1000.0000 mL | Freq: Once | INTRAVENOUS | Status: AC
  Administered 2019-07-06: 18:00:00 1000 mL via INTRAVENOUS

## 2019-07-06 MED ORDER — DEXTROSE 50 % IV SOLN: 0.0000 mL | INTRAVENOUS | Status: DC | PRN

## 2019-07-06 MED ORDER — SODIUM CHLORIDE 0.9% FLUSH
3.0000 mL | Freq: Once | INTRAVENOUS | Status: AC
  Administered 2019-07-06: 3 mL via INTRAVENOUS

## 2019-07-06 MED ORDER — HYDRALAZINE HCL 20 MG/ML IJ SOLN
10.0000 mg | Freq: Once | INTRAMUSCULAR | Status: AC
  Administered 2019-07-07: 10 mg via INTRAVENOUS
  Filled 2019-07-06: qty 1

## 2019-07-06 MED ORDER — ONDANSETRON HCL 4 MG/2ML IJ SOLN
4.0000 mg | Freq: Four times a day (QID) | INTRAMUSCULAR | Status: DC
  Administered 2019-07-06 – 2019-07-07 (×3): 4 mg via INTRAVENOUS
  Filled 2019-07-06 (×3): qty 2

## 2019-07-06 MED ORDER — CHLORHEXIDINE GLUCONATE CLOTH 2 % EX PADS
6.0000 | MEDICATED_PAD | Freq: Every day | CUTANEOUS | Status: DC
  Administered 2019-07-06 – 2019-07-07 (×2): 6 via TOPICAL

## 2019-07-06 MED ORDER — SODIUM CHLORIDE 0.9 % IV BOLUS
1000.0000 mL | Freq: Once | INTRAVENOUS | Status: AC
  Administered 2019-07-06: 17:00:00 1000 mL via INTRAVENOUS

## 2019-07-06 MED ORDER — PROMETHAZINE HCL 25 MG/ML IJ SOLN
12.5000 mg | Freq: Once | INTRAMUSCULAR | Status: AC
  Administered 2019-07-06: 12.5 mg via INTRAVENOUS
  Filled 2019-07-06: qty 1

## 2019-07-06 MED ORDER — POTASSIUM CHLORIDE 10 MEQ/100ML IV SOLN
10.0000 meq | INTRAVENOUS | Status: AC
  Administered 2019-07-06 (×2): 10 meq via INTRAVENOUS
  Filled 2019-07-06 (×2): qty 100

## 2019-07-06 MED ORDER — DEXTROSE-NACL 5-0.45 % IV SOLN: INTRAVENOUS | Status: DC

## 2019-07-06 NOTE — H&P (Signed)
History and Physical    Mason Mclaughlin QQP:[id number] DOB: 1996/01/21 DOA: 07/06/2019  [facility]P: [facility], Carlyn Reichert NP Patient coming from: Home, college student at [employer]   I have personally briefly reviewed patient's old medical records in [facility] Link  Chief Complaint: nausea/vomiting  HPI: Mason Mclaughlin is a 24 y.o. male with medical history significant for type 1 diabetes on insulin pump who presents for persistent nausea and vomiting. Patient began to have acute nausea and vomiting 2 days ago when he woke up.  States that he was otherwise in his normal state of health.  He also had lower abdominal cramping pain no worse with food.  No diarrhea.  No fever or chills. No cough or chest pain.  He was able to continue to use his insulin pump and denies any issues.  He follows with endocrinology at [facility].  His last ED eval for DKA was back in November 2020. Has hx of poorly controlled blood glucose.   He denies any tobacco use but has occasional alcohol and marijuana use.  Last marijuana use was about a week ago.  He had temperature of 99.10F, hypertensive up to systolic of 160s and comfortable on room air. WBC of 13.7. Na of 132, K of 3.6.  Glucose 364. VBG with pH of 7.429, bicarb of 19, Anion gap of 21. Beta-hydroxybutyric acid of 5.72.   He was given 2L NS IV fluid bolus, of potassium, 4mg  of Zofran, 12.5mg  of Phenergan and started on IV insulin infusion.   Review of Systems Constitutional: No Weight Change, No Fever ENT/Mouth: No sore throat, No Rhinorrhea Eyes: No Eye Pain, No Vision Changes Cardiovascular: No Chest Pain, no SOB Respiratory: No Cough, No Sputum,  Gastrointestinal: + Nausea, + Vomiting, No Diarrhea, No Constipation, + Pain Genitourinary: no Urinary Incontinence Musculoskeletal: No Arthralgias, No Myalgias Skin: No Skin Lesions, No Pruritus, Neuro: no Weakness, No Numbness Psych: No Anxiety/Panic, No Depression, no decrease  appetite Heme/Lymph: No Bruising, No Bleeding   Past Medical History:  Diagnosis Date  . Diabetes (HCC)   . DKA (diabetic ketoacidoses) (HCC)   . DKA, type 1 (HCC)   . Insulin pump in place     History reviewed. No pertinent surgical history.   reports that he has never smoked. He has never used smokeless tobacco. He reports current alcohol use. He reports current drug use. Frequency: 4.00 times per week. Drug: Marijuana.  No Known Allergies  Family History  Problem Relation Age of Onset  . Healthy Mother   . Diabetes Father      Prior to Admission medications   Medication Sig Start Date End Date Taking? Authorizing Provider  insulin aspart (NOVOLOG) 100 UNIT/ML injection Inject 0-60 Units into the skin continuous.  04/04/19  Yes [provider]  promethazine (PHENERGAN) 25 MG suppository Place 1 suppository (25 mg total) rectally every 6 (six) hours as needed for nausea or vomiting. Patient not taking: Reported on 04/20/2019 06/29/18   08/28/18, MD    Physical Exam: Vitals:   07/06/19 1530 07/06/19 1539 07/06/19 1630 07/06/19 1730  BP: (!) 163/124  (!) 167/101 131/73  Pulse: (!) 112  71 95  Resp: 20  14 20   Temp: 99.3 F (37.4 C)     TempSrc: Oral     SpO2: 100%  99% 99%  Weight:  63.5 kg    Height:  5\' 5"  (1.651 m)      Constitutional: NAD, calm, comfortable, nontoxic appearing young male laying  flat in bed Vitals:   07/06/19 1530 07/06/19 1539 07/06/19 1630 07/06/19 1730  BP: (!) 163/124  (!) 167/101 131/73  Pulse: (!) 112  71 95  Resp: 20  14 20   Temp: 99.3 F (37.4 C)     TempSrc: Oral     SpO2: 100%  99% 99%  Weight:  63.5 kg    Height:  5\' 5"  (1.651 m)     Eyes: PERRL, lids and conjunctivae normal ENMT: Mucous membranes are moist.  Neck: normal, supple Respiratory: clear to auscultation bilaterally, no wheezing, no crackles. Normal respiratory effort on room air. Cardiovascular: Regular rate and rhythm, no murmurs / rubs / gallops. No  extremity edema. 2+ pedal pulses. Abdomen: Mild lower abdominal tenderness, no masses palpated. Bowel sounds positive.  Insulin pump port on right lower abdomen with pump removed and no erythema of the surrounding skin. Musculoskeletal: no clubbing / cyanosis. No joint deformity upper and lower extremities. Good ROM, no contractures. Normal muscle tone.  Skin: no rashes, lesions, ulcers. No induration Neurologic: CN 2-12 grossly intact. Sensation intact. Strength 5/5 in all 4.  Psychiatric: Normal judgment and insight. Alert and oriented x 3.  Flat affect.     Labs on Admission: I have personally reviewed following labs and imaging studies  CBC: Recent Labs  Lab 07/06/19 1619 07/06/19 1706  WBC 13.7*  --   HGB 15.8 17.0  HCT 44.3 50.0  MCV 83.6  --   PLT 289  --    Basic Metabolic Panel: Recent Labs  Lab 07/06/19 1619 07/06/19 1706  NA 132* 135  K 3.6 4.8  CL 94* 98  CO2 17*  --   GLUCOSE 364* 299*  BUN 18 20  CREATININE 0.92 0.70  CALCIUM 9.3  --    GFR: Estimated Creatinine Clearance: 126 mL/min (by C-G formula based on SCr of 0.7 mg/dL). Liver Function Tests: Recent Labs  Lab 07/06/19 1619  AST 24  ALT 31  ALKPHOS 101  BILITOT 1.5*  PROT 8.3*  ALBUMIN 4.5   Recent Labs  Lab 07/06/19 1619  LIPASE 17   No results for input(s): AMMONIA in the last 168 hours. Coagulation Profile: No results for input(s): INR, PROTIME in the last 168 hours. Cardiac Enzymes: No results for input(s): CKTOTAL, CKMB, CKMBINDEX, TROPONINI in the last 168 hours. BNP (last 3 results) No results for input(s): PROBNP in the last 8760 hours. HbA1C: No results for input(s): HGBA1C in the last 72 hours. CBG: Recent Labs  Lab 07/06/19 1544  GLUCAP 366*   Lipid Profile: No results for input(s): CHOL, HDL, LDLCALC, TRIG, CHOLHDL, LDLDIRECT in the last 72 hours. Thyroid Function Tests: No results for input(s): TSH, T4TOTAL, FREET4, T3FREE, THYROIDAB in the last 72 hours. Anemia  Panel: No results for input(s): VITAMINB12, FOLATE, FERRITIN, TIBC, IRON, RETICCTPCT in the last 72 hours. Urine analysis:    Component Value Date/Time   COLORURINE STRAW (A) 07/06/2019 1543   APPEARANCEUR CLEAR 07/06/2019 1543   LABSPEC 1.029 07/06/2019 1543   PHURINE 6.0 07/06/2019 1543   GLUCOSEU >=500 (A) 07/06/2019 1543   HGBUR NEGATIVE 07/06/2019 1543   BILIRUBINUR NEGATIVE 07/06/2019 1543   KETONESUR 80 (A) 07/06/2019 1543   PROTEINUR NEGATIVE 07/06/2019 1543   NITRITE NEGATIVE 07/06/2019 1543   LEUKOCYTESUR NEGATIVE 07/06/2019 1543    Radiological Exams on Admission: No results found.  EKG: Independently reviewed.   Assessment/Plan  Hyperglycemia in the setting of Type 1 diabetes  Glucose 364. VBG with pH of 7.429,  bicarb of 19, Anion gap of 21. Beta-hydroxybutyric acid of 5.72.  - replete potassium as needed - continue insulin gtt with goal of 140-180 and AG <12 - IV NS until BG <250, then switch to D5 1/2 NS  - BMP q4hr  - keep NPO. PRN antiemetic.   Elevated blood pressure Continue to monitor. Likely due to illness  DVT prophylaxis:.Lovenox Code Status: Full Family Communication: Plan discussed with patient at bedside  disposition Plan: Home with at least 2 midnight stays  Consults called:  Admission status: inpatient  Lyndzee Kliebert T Jami Bogdanski DO [facility]   If 7PM-7AM, please contact night-coverage [email or web address]   07/06/2019, 6:36 PM

## 2019-07-06 NOTE — ED Triage Notes (Signed)
Patient c/o emesis x 2 days. Patient denies abdominal pain or diarrhea.

## 2019-07-06 NOTE — ED Provider Notes (Signed)
[facility]-EMERGENCY DEPT Provider Note   CSN: [id number] Arrival date & time: 07/06/19  1522    History Chief Complaint  Patient presents with  . Emesis   Mason Mclaughlin is a 24 y.o. male with medical history significant for diabetes resents for evaluation nausea and vomiting.  Patient with persistent NBNB emesis over the last 2 days.  States blood sugars have been running 200s-300s at home.  Denies fever, chills, chest pain, shortness of breath.  Does have some mild epigastric pain which began after emesis, dysuria, diarrhea, constipation.  Denies additional aggravating relieving factors.  Took Benadryl at home without relief of his symptoms.  No hematochezia.  Denies additional aggravating relieving factors.  Denies any pain.  History obtained from patient and past medical records.  No interpreter is used.  HPI     Past Medical History:  Diagnosis Date  . Diabetes (HCC)   . DKA (diabetic ketoacidoses) (HCC)   . DKA, type 1 (HCC)   . Insulin pump in place     Patient Active Problem List   Diagnosis Date Noted  . Hyperglycemia 07/06/2019  . ARF (acute renal failure) (HCC) 04/17/2018  . AKI (acute kidney injury) (HCC)   . Nausea and vomiting   . DKA (diabetic ketoacidoses) (HCC) 05/29/2017    History reviewed. No pertinent surgical history.     Family History  Problem Relation Age of Onset  . Healthy Mother   . Diabetes Father     Social History   Tobacco Use  . Smoking status: Never Smoker  . Smokeless tobacco: Never Used  Substance Use Topics  . Alcohol use: Yes  . Drug use: Yes    Frequency: 4.0 times per week    Types: Marijuana    Home Medications Prior to Admission medications   Medication Sig Start Date End Date Taking? Authorizing Provider  insulin aspart (NOVOLOG) 100 UNIT/ML injection Inject 0-60 Units into the skin continuous.  04/04/19  Yes [provider]  promethazine (PHENERGAN) 25 MG suppository Place 1  suppository (25 mg total) rectally every 6 (six) hours as needed for nausea or vomiting. Patient not taking: Reported on 04/20/2019 06/29/18   Molpus, Jonny Ruiz, MD    Allergies    Patient has no known allergies.  Review of Systems   Review of Systems  Constitutional: Negative.   HENT: Negative.   Respiratory: Negative.   Cardiovascular: Negative.   Gastrointestinal: Positive for abdominal pain (Epigastric), nausea and vomiting. Negative for abdominal distention, anal bleeding, blood in stool, constipation, diarrhea and rectal pain.  Genitourinary: Negative.   Musculoskeletal: Negative.   Skin: Negative.   Neurological: Negative.   All other systems reviewed and are negative.   Physical Exam Updated Vital Signs BP 131/73   Pulse 95   Temp 99.3 F (37.4 C) (Oral)   Resp 20   Ht 5\' 5"  (1.651 m)   Wt 63.5 kg   SpO2 99%   BMI 23.30 kg/m   Physical Exam Vitals and nursing note reviewed.  Constitutional:      General: He is not in acute distress.    Appearance: He is well-developed. He is not ill-appearing, toxic-appearing or diaphoretic.  HENT:     Head: Normocephalic and atraumatic.     Nose: Nose normal.     Mouth/Throat:     Mouth: Mucous membranes are dry.  Eyes:     Pupils: Pupils are equal, round, and reactive to light.  Cardiovascular:     Rate and  Rhythm: Normal rate and regular rhythm.  Pulmonary:     Effort: Pulmonary effort is normal. No respiratory distress.     Breath sounds: Normal breath sounds.  Abdominal:     General: Bowel sounds are normal. There is no distension.     Palpations: Abdomen is soft. There is no mass.     Tenderness: There is no abdominal tenderness. There is no right CVA tenderness, left CVA tenderness, guarding or rebound.     Hernia: No hernia is present.  Musculoskeletal:        General: Normal range of motion.     Cervical back: Normal range of motion and neck supple.  Skin:    General: Skin is warm and dry.     Capillary Refill:  Capillary refill takes less than 2 seconds.  Neurological:     General: No focal deficit present.     Mental Status: He is alert and oriented to person, place, and time.     ED Results / Procedures / Treatments   Labs (all labs ordered are listed, but only abnormal results are displayed) Labs Reviewed  COMPREHENSIVE METABOLIC PANEL - Abnormal; Notable for the following components:      Result Value   Sodium 132 (*)    Chloride 94 (*)    CO2 17 (*)    Glucose, Bld 364 (*)    Total Protein 8.3 (*)    Total Bilirubin 1.5 (*)    Anion gap 21 (*)    All other components within normal limits  CBC - Abnormal; Notable for the following components:   WBC 13.7 (*)    All other components within normal limits  URINALYSIS, ROUTINE W REFLEX MICROSCOPIC - Abnormal; Notable for the following components:   Color, Urine STRAW (*)    Glucose, UA >=500 (*)    Ketones, ur 80 (*)    All other components within normal limits  BLOOD GAS, VENOUS - Abnormal; Notable for the following components:   pCO2, Ven 29.5 (*)    Bicarbonate 19.2 (*)    Acid-base deficit 3.2 (*)    All other components within normal limits  BETA-HYDROXYBUTYRIC ACID - Abnormal; Notable for the following components:   Beta-Hydroxybutyric Acid 5.72 (*)    All other components within normal limits  I-STAT CHEM 8, ED - Abnormal; Notable for the following components:   Glucose, Bld 299 (*)    Calcium, Ion 1.05 (*)    All other components within normal limits  CBG MONITORING, ED - Abnormal; Notable for the following components:   Glucose-Capillary 366 (*)    All other components within normal limits  SARS CORONAVIRUS 2 (TAT 6-24 HRS)  LIPASE, BLOOD  BETA-HYDROXYBUTYRIC ACID  BASIC METABOLIC PANEL  HIV ANTIBODY (ROUTINE TESTING W REFLEX)  BASIC METABOLIC PANEL  BASIC METABOLIC PANEL  BASIC METABOLIC PANEL  HEMOGLOBIN A1C  I-STAT VENOUS BLOOD GAS, ED    EKG EKG Interpretation  Date/Time:  Friday July 06 2019  15:55:26 EST Ventricular Rate:  99 PR Interval:    QRS Duration: 70 QT Interval:  348 QTC Calculation: 447 R Axis:   66 Text Interpretation: Sinus rhythm Borderline T wave abnormalities No significant change was found Nonspecific T wave abnormality Confirmed by Glynn Octave [phone]) on 07/06/2019 5:01:08 PM   Radiology No results found.  Procedures .Critical Care Performed by: Linwood Dibbles, PA-C Authorized by: Linwood Dibbles, PA-C   Critical care provider statement:    Critical care time (minutes):  45  Critical care was necessary to treat or prevent imminent or life-threatening deterioration of the following conditions:  Endocrine crisis (DKA on insulin drip)   Critical care was time spent personally by me on the following activities:  Discussions with consultants, evaluation of patient's response to treatment, examination of patient, ordering and performing treatments and interventions, ordering and review of laboratory studies, ordering and review of radiographic studies, pulse oximetry, re-evaluation of patient's condition, obtaining history from patient or surrogate and review of old charts   (including critical care time)  Medications Ordered in ED Medications  insulin regular, human (MYXREDLIN) 100 units/ 100 mL infusion (has no administration in time range)  0.9 %  sodium chloride infusion ( Intravenous New Bag/Given 07/06/19 1833)  dextrose 5 %-0.45 % sodium chloride infusion (has no administration in time range)  dextrose 50 % solution 0-50 mL (has no administration in time range)  potassium chloride 10 mEq in 100 mL IVPB (10 mEq Intravenous New Bag/Given 07/06/19 1832)  enoxaparin (LOVENOX) injection 40 mg (has no administration in time range)  sodium chloride flush (NS) 0.9 % injection 3 mL (3 mLs Intravenous Given 07/06/19 1622)  sodium chloride 0.9 % bolus 1,000 mL (0 mLs Intravenous Stopped 07/06/19 1736)  ondansetron (ZOFRAN) injection 4 mg (4 mg  Intravenous Given 07/06/19 1633)  sodium chloride 0.9 % bolus 1,000 mL (0 mLs Intravenous Stopped 07/06/19 1834)  promethazine (PHENERGAN) injection 12.5 mg (12.5 mg Intravenous Given 07/06/19 1753)   ED Course  I have reviewed the triage vital signs and the nursing notes.  Pertinent labs & imaging results that were available during my care of the patient were reviewed by me and considered in my medical decision making (see chart for details).  25 year old male type I diabetic presents for evaluation of emesis x2 days.  Mild epigastric tenderness which began after emesis.  Blood sugar 200-300 at home.  No urinary symptoms.  No chest pain, shortness of breath, lower extremity edema.  No evidence of DVT on exam.  Heart and lungs clear.  Abdomen soft with some very minimal epigastric pain however negative Murphy sign.  No prior abdominal surgeries have low suspicion for bowel obstruction.  Plan on IV fluids, labs, antiemetics and reevaluate.  1730: Patient with persistent emesis despite Zofran, IVF. Labs with hyperglycemia and anion gap however no acidosis, normal bicarb. Question dehydration very early DKA? Urine with ketones and glucosuria. Abd soft, non tender without rebound or guarding. Will obtain betahydroxyacid, additional IVF.  1800: Patient with persistent nausea wo emesis after Zofran and phenergan. Betahydroxy elevated concern for early DKA. Will start insulin drip and admit. Discussed with patient. Agreeable for admission.  1830: Consult with Dr. Flossie Buffy with [facility] who will evaluate patient for admission.  The patient appears reasonably stabilized for admission considering the current resources, flow, and capabilities available in the ED at this time, and I doubt any other [facility] requiring further screening and/or treatment in the ED prior to admission.  Patient seen and evaluated by attending Dr. Wyvonnia Dusky who agrees with above treatment, plan and disposition.    MDM Rules/Calculators/A&P                       Final Clinical Impression(s) / ED Diagnoses Final diagnoses:  Hyperglycemia due to type 1 diabetes mellitus [facility])    Rx / DC Orders ED Discharge Orders    None       Avangelina Flight A, PA-C 07/06/19 1836    Rancour, Annie Main,  MD 07/06/19 2013

## 2019-07-07 LAB — HEMOGLOBIN A1C
Hgb A1c MFr Bld: 10.1 % — ABNORMAL HIGH (ref 4.8–5.6)
Mean Plasma Glucose: 243.17 mg/dL

## 2019-07-07 LAB — GLUCOSE, CAPILLARY
Glucose-Capillary: 119 mg/dL — ABNORMAL HIGH (ref 70–99)
Glucose-Capillary: 122 mg/dL — ABNORMAL HIGH (ref 70–99)
Glucose-Capillary: 123 mg/dL — ABNORMAL HIGH (ref 70–99)
Glucose-Capillary: 134 mg/dL — ABNORMAL HIGH (ref 70–99)
Glucose-Capillary: 136 mg/dL — ABNORMAL HIGH (ref 70–99)
Glucose-Capillary: 139 mg/dL — ABNORMAL HIGH (ref 70–99)
Glucose-Capillary: 149 mg/dL — ABNORMAL HIGH (ref 70–99)
Glucose-Capillary: 152 mg/dL — ABNORMAL HIGH (ref 70–99)
Glucose-Capillary: 156 mg/dL — ABNORMAL HIGH (ref 70–99)
Glucose-Capillary: 157 mg/dL — ABNORMAL HIGH (ref 70–99)
Glucose-Capillary: 163 mg/dL — ABNORMAL HIGH (ref 70–99)
Glucose-Capillary: 178 mg/dL — ABNORMAL HIGH (ref 70–99)
Glucose-Capillary: 195 mg/dL — ABNORMAL HIGH (ref 70–99)

## 2019-07-07 LAB — BASIC METABOLIC PANEL
Anion gap: 11 (ref 5–15)
Anion gap: 10 (ref 5–15)
Anion gap: 11 (ref 5–15)
BUN: 13 mg/dL (ref 6–20)
BUN: 12 mg/dL (ref 6–20)
BUN: 11 mg/dL (ref 6–20)
CO2: 22 mmol/L (ref 22–32)
CO2: 21 mmol/L — ABNORMAL LOW (ref 22–32)
CO2: 21 mmol/L — ABNORMAL LOW (ref 22–32)
Calcium: 8.4 mg/dL — ABNORMAL LOW (ref 8.9–10.3)
Calcium: 8.3 mg/dL — ABNORMAL LOW (ref 8.9–10.3)
Calcium: 8.3 mg/dL — ABNORMAL LOW (ref 8.9–10.3)
Chloride: 104 mmol/L (ref 98–111)
Chloride: 105 mmol/L (ref 98–111)
Chloride: 105 mmol/L (ref 98–111)
Creatinine, Ser: 0.75 mg/dL (ref 0.61–1.24)
Creatinine, Ser: 0.69 mg/dL (ref 0.61–1.24)
Creatinine, Ser: 0.74 mg/dL (ref 0.61–1.24)
GFR calc Af Amer: >60 mL/min (ref >60)
GFR calc Af Amer: >60 mL/min (ref >60)
GFR calc Af Amer: >60 mL/min (ref >60)
GFR calc non Af Amer: >60 mL/min (ref >60)
GFR calc non Af Amer: >60 mL/min (ref >60)
GFR calc non Af Amer: >60 mL/min (ref >60)
Glucose, Bld: 137 mg/dL — ABNORMAL HIGH (ref 70–99)
Glucose, Bld: 145 mg/dL — ABNORMAL HIGH (ref 70–99)
Glucose, Bld: 144 mg/dL — ABNORMAL HIGH (ref 70–99)
Potassium: 3.2 mmol/L — ABNORMAL LOW (ref 3.5–5.1)
Potassium: 4 mmol/L (ref 3.5–5.1)
Potassium: 4 mmol/L (ref 3.5–5.1)
Sodium: 137 mmol/L (ref 135–145)
Sodium: 136 mmol/L (ref 135–145)
Sodium: 137 mmol/L (ref 135–145)

## 2019-07-07 LAB — BETA-HYDROXYBUTYRIC ACID: Beta-Hydroxybutyric Acid: 0.79 mmol/L — ABNORMAL HIGH (ref 0.05–0.27)

## 2019-07-07 LAB — SARS CORONAVIRUS 2 (TAT 6-24 HRS): SARS Coronavirus 2: NEGATIVE

## 2019-07-07 LAB — MRSA PCR SCREENING: MRSA by PCR: NEGATIVE

## 2019-07-07 LAB — HIV ANTIBODY (ROUTINE TESTING W REFLEX): HIV Screen 4th Generation wRfx: NONREACTIVE

## 2019-07-07 MED ORDER — INSULIN ASPART 100 UNIT/ML ~~LOC~~ SOLN: 0.0000 [IU] | Freq: Every day | SUBCUTANEOUS | Status: DC

## 2019-07-07 MED ORDER — INSULIN ASPART 100 UNIT/ML ~~LOC~~ SOLN
0.0000 [IU] | Freq: Three times a day (TID) | SUBCUTANEOUS | Status: DC
  Administered 2019-07-07 (×2): 1 [IU] via SUBCUTANEOUS
  Administered 2019-07-08: 3 [IU] via SUBCUTANEOUS

## 2019-07-07 MED ORDER — INSULIN PUMP: Freq: Three times a day (TID) | SUBCUTANEOUS | Status: DC

## 2019-07-07 MED ORDER — DIPHENHYDRAMINE HCL 25 MG PO CAPS
25.0000 mg | ORAL_CAPSULE | Freq: Once | ORAL | Status: AC
  Administered 2019-07-07: 25 mg via ORAL
  Filled 2019-07-07: qty 1

## 2019-07-07 MED ORDER — INSULIN ASPART 100 UNIT/ML ~~LOC~~ SOLN
6.0000 [IU] | Freq: Three times a day (TID) | SUBCUTANEOUS | Status: DC
  Administered 2019-07-07 – 2019-07-08 (×2): 6 [IU] via SUBCUTANEOUS

## 2019-07-07 MED ORDER — POTASSIUM CHLORIDE 10 MEQ/100ML IV SOLN
10.0000 meq | INTRAVENOUS | Status: AC
  Administered 2019-07-07 (×2): 10 meq via INTRAVENOUS
  Filled 2019-07-07 (×2): qty 100

## 2019-07-07 MED ORDER — ORAL CARE MOUTH RINSE
15.0000 mL | Freq: Two times a day (BID) | OROMUCOSAL | Status: DC
  Administered 2019-07-07 (×2): 15 mL via OROMUCOSAL

## 2019-07-07 MED ORDER — PROCHLORPERAZINE EDISYLATE 10 MG/2ML IJ SOLN
10.0000 mg | INTRAMUSCULAR | Status: DC | PRN
  Administered 2019-07-07: 13:00:00 10 mg via INTRAVENOUS
  Filled 2019-07-07: qty 2

## 2019-07-07 MED ORDER — INSULIN GLARGINE 100 UNIT/ML ~~LOC~~ SOLN
24.0000 [IU] | Freq: Once | SUBCUTANEOUS | Status: AC
  Administered 2019-07-07: 10:00:00 24 [IU] via SUBCUTANEOUS
  Filled 2019-07-07: qty 0.24

## 2019-07-07 NOTE — Progress Notes (Signed)
Transferred from ICU to room 1520. Writer concurs with previous nursing assessment. Call bell within reach. Will continue to monitor.

## 2019-07-07 NOTE — Progress Notes (Signed)
PROGRESS NOTE    Urian Martenson  YBO:[id number] DOB: [birth year] DOA: 07/06/2019 PCP: Patient, No Pcp Per    Brief Narrative:  24 y.o. male with medical history significant for type 1 diabetes on insulin pump who presents for persistent nausea and vomiting. Patient began to have acute nausea and vomiting 2 days ago when he woke up.  States that he was otherwise in his normal state of health.  He also had lower abdominal cramping pain no worse with food.  No diarrhea.  No fever or chills. No cough or chest pain.  He was able to continue to use his insulin pump and denies any issues.  He follows with endocrinology at [facility].  His last ED eval for DKA was back in November 2020. Has hx of poorly controlled blood glucose.   He denies any tobacco use but has occasional alcohol and marijuana use.  Last marijuana use was about a week ago.  He had temperature of 99.73F, hypertensive up to systolic of 160s and comfortable on room air. WBC of 13.7. Na of 132, K of 3.6.  Glucose 364. VBG with pH of 7.429, bicarb of 19, Anion gap of 21. Beta-hydroxybutyric acid of 5.72.   He was given 2L NS IV fluid bolus, of potassium, 4mg  of Zofran, 12.5mg  of Phenergan and started on IV insulin infusion.   Assessment & Plan:   Principal Problem:   Hyperglycemia Active Problems:   Nausea and vomiting   Elevated blood pressure reading   1. Uncontrolled type 1 DM with DKA 1. Pt has functioning insulin pump, presents with DKA, GAP peak to 17 2. DKA physiology resolved with insulin gtt and IVF hydration 3. Patient has admitted to noncompliance with insulin pump stating leaving if off at times  4. Given lantus to transition off insulin gtt 5. Appreciate input by Diabetic Coordinator. Plan to continue subq insulin until tomorrow AM around 9am, at which point will transition to insulin pump 2. HTN 1. BP stable at this time 3. Metabolic acidosis 1. Secondary to DKA 2. Resolved 3. Repeat bmet in AM  DVT  prophylaxis: Lovenox subq Code Status: Full Family Communication: Pt in room, family not at bedside Disposition Plan: From home, to home after transition back to insulin pump  Consultants:   Diabetic Coordinator  Procedures:     Antimicrobials: Anti-infectives (From admission, onward)   None       Subjective: Feeling better. Eager to go home soon  Objective: Vitals:   07/07/19 0900 07/07/19 1000 07/07/19 1305 07/07/19 1604  BP: (!) 148/71 (!) 149/86 (!) 142/96 121/77  Pulse: 85 83 69 88  Resp: 15 16 14 16   Temp:   99.4 F (37.4 C) 98.8 F (37.1 C)  TempSrc:   Oral Oral  SpO2: 99% 100% 99% 99%  Weight:      Height:        Intake/Output Summary (Last 24 hours) at 07/07/2019 1632 Last data filed at 07/07/2019 1140 Gross per 24 hour  Intake 2849.33 ml  Output 650 ml  Net 2199.33 ml   Filed Weights   07/06/19 1539 07/06/19 2110  Weight: 63.5 kg 64.2 kg    Examination:  General exam: Appears calm and comfortable  Respiratory system: Clear to auscultation. Respiratory effort normal. Cardiovascular system: S1 & S2 heard, Regular Gastrointestinal system: Abdomen is nondistended, soft and nontender. No organomegaly or masses felt. Normal bowel sounds heard. Central nervous system: Alert and oriented. No focal neurological deficits. Extremities: Symmetric 5 x 5  power. Skin: No rashes, lesions  Psychiatry: Judgement and insight appear normal. Mood & affect appropriate.   Data Reviewed: I have personally reviewed following labs and imaging studies  CBC: Recent Labs  Lab 07/06/19 1619 07/06/19 1706  WBC 13.7*  --   HGB 15.8 17.0  HCT 44.3 50.0  MCV 83.6  --   PLT 289  --    Basic Metabolic Panel: Recent Labs  Lab 07/06/19 1827 07/06/19 2143 07/07/19 0214 07/07/19 0656 07/07/19 1043  NA 135 137 137 136 137  K 4.8 4.0 3.2* 4.0 4.0  CL 101 101 104 105 105  CO2 21* 19* 22 21* 21*  GLUCOSE 209* 282* 137* 145* 144*  BUN 13 14 13 12 11   CREATININE 0.88  0.85 0.75 0.69 0.74  CALCIUM 8.4* 8.5* 8.4* 8.3* 8.3*   GFR: Estimated Creatinine Clearance: 126 mL/min (by C-G formula based on SCr of 0.74 mg/dL). Liver Function Tests: Recent Labs  Lab 07/06/19 1619  AST 24  ALT 31  ALKPHOS 101  BILITOT 1.5*  PROT 8.3*  ALBUMIN 4.5   Recent Labs  Lab 07/06/19 1619  LIPASE 17   No results for input(s): AMMONIA in the last 168 hours. Coagulation Profile: No results for input(s): INR, PROTIME in the last 168 hours. Cardiac Enzymes: No results for input(s): CKTOTAL, CKMB, CKMBINDEX, TROPONINI in the last 168 hours. BNP (last 3 results) No results for input(s): PROBNP in the last 8760 hours. HbA1C: Recent Labs    07/06/19 2143  HGBA1C 10.1*   CBG: Recent Labs  Lab 07/07/19 0646 07/07/19 0858 07/07/19 1008 07/07/19 1131 07/07/19 1605  GLUCAP 152* 157* 136* 139* 134*   Lipid Profile: No results for input(s): CHOL, HDL, LDLCALC, TRIG, CHOLHDL, LDLDIRECT in the last 72 hours. Thyroid Function Tests: No results for input(s): TSH, T4TOTAL, FREET4, T3FREE, THYROIDAB in the last 72 hours. Anemia Panel: No results for input(s): VITAMINB12, FOLATE, FERRITIN, TIBC, IRON, RETICCTPCT in the last 72 hours. Sepsis Labs: No results for input(s): PROCALCITON, LATICACIDVEN in the last 168 hours.  Recent Results (from the past 240 hour(s))  SARS CORONAVIRUS 2 (TAT 6-24 HRS) Nasopharyngeal Nasopharyngeal Swab     Status: None   Collection Time: 07/06/19  6:07 PM   Specimen: Nasopharyngeal Swab  Result Value Ref Range Status   SARS Coronavirus 2 NEGATIVE NEGATIVE Final    Comment: (NOTE) SARS-CoV-2 target nucleic acids are NOT DETECTED. The SARS-CoV-2 RNA is generally detectable in upper and lower respiratory specimens during the acute phase of infection. Negative results do not preclude SARS-CoV-2 infection, do not rule out co-infections with other pathogens, and should not be used as the sole basis for treatment or other patient management  decisions. Negative results must be combined with clinical observations, patient history, and epidemiological information. The expected result is Negative. Fact Sheet for Patients: SugarRoll.be Fact Sheet for Healthcare Providers: https://www.woods-mathews.com/ This test is not yet approved or cleared by the [place] FDA and  has been authorized for detection and/or diagnosis of SARS-CoV-2 by FDA under an Emergency Use Authorization (EUA). This EUA will remain  in effect (meaning this test can be used) for the duration of the COVID-19 declaration under Section 56 4(b)(1) of the Act, 21 U.S.C. section 360bbb-3(b)(1), unless the authorization is terminated or revoked sooner. Performed at [facility], [place] [address], [place], [place] [address]   MRSA PCR Screening     Status: None   Collection Time: 07/06/19  9:03 PM   Specimen: Nasal  Mucosa; Nasopharyngeal  Result Value Ref Range Status   MRSA by PCR NEGATIVE NEGATIVE Final    Comment:        The GeneXpert MRSA Assay (FDA approved for NASAL specimens only), is one component of a comprehensive MRSA colonization surveillance program. It is not intended to diagnose MRSA infection nor to guide or monitor treatment for MRSA infections. Performed at [facility], [address], [place], [place] [address]      Radiology Studies: No results found.  Scheduled Meds: . enoxaparin (LOVENOX) injection  40 mg Subcutaneous Q24H  . insulin aspart  0-5 Units Subcutaneous QHS  . insulin aspart  0-9 Units Subcutaneous TID WC  . insulin aspart  6 Units Subcutaneous TID WC  . mouth rinse  15 mL Mouth Rinse BID  . ondansetron (ZOFRAN) IV  4 mg Intravenous Q6H   Continuous Infusions:   LOS: 1 day   Rickey Barbara, MD [facility] Pager On Amion  If 7PM-7AM, please contact night-coverage 07/07/2019, 4:32 PM

## 2019-07-07 NOTE — Progress Notes (Addendum)
Inpatient Diabetes Program Recommendations  AACE/ADA: New Consensus Statement on Inpatient Glycemic Control (2015)  Target Ranges:  Prepandial:   less than 140 mg/dL      Peak postprandial:   less than 180 mg/dL (1-2 hours)      Critically ill patients:  140 - 180 mg/dL   Results for MAEJOR, ERVEN (MRN [id number]) as of 07/07/2019 13:17  Ref. Range 07/07/2019 06:04 07/07/2019 06:46 07/07/2019 08:58 07/07/2019 10:08 07/07/2019 11:31  Glucose-Capillary Latest Ref Range: 70 - 99 mg/dL 353 (H) 614 (H) 431 (H) 136 (H) 139 (H)    Admit with: DKA  History: Type 1 diabetes  Home DM Meds: Insulin Pump  Current Orders: IV Insulin Drip      Lantus 24 units X 1 dose      Novolog Sensitive Correction Scale/ SSI (0-9 units) TID AC + HS     10:43am BMET shows the following: Glucose 144 mg/dl CO2 level 21 Anion Gap 11   Note Lantus 24 units given at 9:43 AM for transition to SQ Insulin.  Novolog started at 11:49am.  MD--Is plan to have pt resume home insulin pump tomorrow?  If so, can have pt restart insulin pump (if pt has all new supplies) tomorrow at 9 am.  Need to wait about 24 hours since last dose Lantus given before resuming pump.   Addendum 3pm--Spoke w/ Dr. Rhona Leavens by secure chat this afternoon.  Plan is to have pt resume his insulin pump tomorrow after 9am.  Got in touch w/ pt by phone--he stated he has new insulin pump supplies with him so he can resume his pump tomorrow AM.     Endocrinologist: Dr. Tiffany Kocher with [facility]--last seen via telemedicine visit 12/22/2018--Insulin Pump settings were as follows at that last visit: Basal Rates: 12am- 1.3 units/hr 6:30am- 1.5 units/hr 12pm- 1.4 units/hr 11pm- 1.15 units/hr Total Basal every 24 hours= 33.25 units Give Lantus 32 units in care of pump failure  Correction ratio= 1 unit for every 30 mg/dl >540-086 mg/dl  Carbohydrate Coverage Ratio= 1 unit for every 8.5 grams Carbs     --Will follow patient during  hospitalization--  Ambrose Finland RN, MSN, CDE Diabetes Coordinator Inpatient Glycemic Control Team Team Pager: [phone] (8a-5p)

## 2019-07-08 DIAGNOSIS — E1065 Type 1 diabetes mellitus with hyperglycemia: Secondary | ICD-10-CM

## 2019-07-08 LAB — BASIC METABOLIC PANEL
Anion gap: 11 (ref 5–15)
BUN: 10 mg/dL (ref 6–20)
CO2: 26 mmol/L (ref 22–32)
Calcium: 8.8 mg/dL — ABNORMAL LOW (ref 8.9–10.3)
Chloride: 100 mmol/L (ref 98–111)
Creatinine, Ser: 0.81 mg/dL (ref 0.61–1.24)
GFR calc Af Amer: >60 mL/min (ref >60)
GFR calc non Af Amer: >60 mL/min (ref >60)
Glucose, Bld: 185 mg/dL — ABNORMAL HIGH (ref 70–99)
Potassium: 3.9 mmol/L (ref 3.5–5.1)
Sodium: 137 mmol/L (ref 135–145)

## 2019-07-08 LAB — CBC
HCT: 41.2 % (ref 39.0–52.0)
Hemoglobin: 14 g/dL (ref 13.0–17.0)
MCH: 29.4 pg (ref 26.0–34.0)
MCHC: 34 g/dL (ref 30.0–36.0)
MCV: 86.4 fL (ref 80.0–100.0)
Platelets: 259 10*3/uL (ref 150–400)
RBC: 4.77 MIL/uL (ref 4.22–5.81)
RDW: 11.9 % (ref 11.5–15.5)
WBC: 9.4 10*3/uL (ref 4.0–10.5)
nRBC: 0 % (ref 0.0–0.2)

## 2019-07-08 LAB — GLUCOSE, CAPILLARY
Glucose-Capillary: 211 mg/dL — ABNORMAL HIGH (ref 70–99)
Glucose-Capillary: 217 mg/dL — ABNORMAL HIGH (ref 70–99)

## 2019-07-08 MED ORDER — INSULIN PUMP
Freq: Three times a day (TID) | SUBCUTANEOUS | Status: DC
  Administered 2019-07-08: 12:00:00 3.57 via SUBCUTANEOUS
  Filled 2019-07-08: qty 1

## 2019-07-08 NOTE — Progress Notes (Signed)
Nsg Discharge Note  Admit Date:  07/06/2019 Discharge date: 07/08/2019   Cleland Simkins to be D/C'd Home per MD order.  AVS completed.  Copy for chart, and copy for patient signed, and dated. Patient/caregiver able to verbalize understanding.  Discharge Medication: Allergies as of 07/08/2019   No Known Allergies     Medication List    STOP taking these medications   promethazine 25 MG suppository Commonly known as: PHENERGAN     TAKE these medications   NovoLOG 100 UNIT/ML injection Generic drug: insulin aspart Inject 0-60 Units into the skin continuous.       Discharge Assessment: Vitals:   07/07/19 2053 07/08/19 0542  BP: 139/88 (!) 139/93  Pulse: 71 87  Resp: 16 16  Temp: 98.4 F (36.9 C) 97.7 F (36.5 C)  SpO2: 99% 99%   Skin clean, dry and intact without evidence of skin break down, no evidence of skin tears noted. IV catheter discontinued intact. Site without signs and symptoms of complications - no redness or edema noted at insertion site, patient denies c/o pain - only slight tenderness at site.  Dressing with slight pressure applied.  D/c Instructions-Education: Discharge instructions given to patient/family with verbalized understanding. D/c education completed with patient/family including follow up instructions, medication list, d/c activities limitations if indicated, with other d/c instructions as indicated by MD - patient able to verbalize understanding, all questions fully answered. Patient instructed to return to ED, call 911, or call MD for any changes in condition.  Patient escorted to front entrance, and D/C home via private auto.  Robert Bellow, RN 07/08/2019 1:26 PM

## 2019-07-08 NOTE — Discharge Summary (Signed)
Physician Discharge Summary  Gregary Blackard UQJ:[id number] DOB: 19-Mar-1996 DOA: 07/06/2019  PCP: Patient, No Pcp Per  Admit date: 07/06/2019 Discharge date: 07/08/2019  Admitted From: Home Disposition:  Home  Recommendations for Outpatient Follow-up:  1. Follow up with PCP in 1-2 weeks  Discharge Condition:Improved CODE STATUS:Full Diet recommendation: Diabetic   Brief/Interim Summary: 24 y.o.malewith medical history significant fortype 1 diabetes on insulin pump who presents for persistent nausea and vomiting. Patient began to have acute nausea and vomiting 2 days ago when he woke up. States that he was otherwise in his normal state of health. He also had lower abdominal cramping pain no worse with food. No diarrhea. No fever or chills. No cough or chest pain. He was able to continue to use his insulin pump and denies any issues. He follows with endocrinology at [facility]. His last ED eval for DKA was back in November 2020. Has hx of poorly controlled blood glucose.   Hedenies any tobacco use but has occasional alcohol and marijuana use. Last marijuana use was about a week ago.  He had temperature of 99.10F, hypertensive up to systolic of 160s and comfortable on room air. WBC of 13.7. Na of 132, K of 3.6.  Glucose 364. VBG with pH of 7.429, bicarb of 19, Anion gap of 21. Beta-hydroxybutyric acid of 5.72.   He was given 2L NS IV fluid bolus, of potassium, 4mg  of Zofran, 12.5mg  of Phenergan and started on IV insulin infusion.  Discharge Diagnoses:  Principal Problem:   Hyperglycemia Active Problems:   Nausea and vomiting   Elevated blood pressure reading   1. Uncontrolled type 1 DM with DKA 1. Pt has functioning insulin pump, presents with DKA, GAP peak to 17 2. DKA physiology resolved with insulin gtt and IVF hydration 3. Patient has admitted to noncompliance with insulin pump stating leaving if off at times  4. Transitioned off insulin gtt to  subq 5. Appreciate input by Diabetic Coordinator. Pt subsequently transitioned from subq insulin to home insulin pump 2/14 2. HTN 1. BP stable at this time 3. Metabolic acidosis 1. Secondary to DKA 2. Resolved  Discharge Instructions   Allergies as of 07/08/2019   No Known Allergies     Medication List    STOP taking these medications   promethazine 25 MG suppository Commonly known as: PHENERGAN     TAKE these medications   NovoLOG 100 UNIT/ML injection Generic drug: insulin aspart Inject 0-60 Units into the skin continuous.      Follow-up Information    Follow up with your PCP in 1-2 weeks. Schedule an appointment as soon as possible for a visit.          No Known Allergies  Procedures/Studies:  No results found.  Subjective: Eager to go home  Discharge Exam: Vitals:   07/07/19 2053 07/08/19 0542  BP: 139/88 (!) 139/93  Pulse: 71 87  Resp: 16 16  Temp: 98.4 F (36.9 C) 97.7 F (36.5 C)  SpO2: 99% 99%   Vitals:   07/07/19 1305 07/07/19 1604 07/07/19 2053 07/08/19 0542  BP: (!) 142/96 121/77 139/88 (!) 139/93  Pulse: 69 88 71 87  Resp: 14 16 16 16   Temp: 99.4 F (37.4 C) 98.8 F (37.1 C) 98.4 F (36.9 C) 97.7 F (36.5 C)  TempSrc: Oral Oral    SpO2: 99% 99% 99% 99%  Weight:      Height:        General: Pt is alert, awake, not in acute  distress Cardiovascular: RRR, S1/S2 +, no rubs, no gallops Respiratory: CTA bilaterally, no wheezing, no rhonchi Abdominal: Soft, NT, ND, bowel sounds + Extremities: no edema, no cyanosis   The results of significant diagnostics from this hospitalization (including imaging, microbiology, ancillary and laboratory) are listed below for reference.     Microbiology: Recent Results (from the past 240 hour(s))  SARS CORONAVIRUS 2 (TAT 6-24 HRS) Nasopharyngeal Nasopharyngeal Swab     Status: None   Collection Time: 07/06/19  6:07 PM   Specimen: Nasopharyngeal Swab  Result Value Ref Range Status   SARS  Coronavirus 2 NEGATIVE NEGATIVE Final    Comment: (NOTE) SARS-CoV-2 target nucleic acids are NOT DETECTED. The SARS-CoV-2 RNA is generally detectable in upper and lower respiratory specimens during the acute phase of infection. Negative results do not preclude SARS-CoV-2 infection, do not rule out co-infections with other pathogens, and should not be used as the sole basis for treatment or other patient management decisions. Negative results must be combined with clinical observations, patient history, and epidemiological information. The expected result is Negative. Fact Sheet for Patients: SugarRoll.be Fact Sheet for Healthcare Providers: https://www.woods-mathews.com/ This test is not yet approved or cleared by the [place] FDA and  has been authorized for detection and/or diagnosis of SARS-CoV-2 by FDA under an Emergency Use Authorization (EUA). This EUA will remain  in effect (meaning this test can be used) for the duration of the COVID-19 declaration under Section 56 4(b)(1) of the Act, 21 U.S.C. section 360bbb-3(b)(1), unless the authorization is terminated or revoked sooner. Performed at [facility], [place] [address] [place], [place] [address]   MRSA PCR Screening     Status: None   Collection Time: 07/06/19  9:03 PM   Specimen: Nasal Mucosa; Nasopharyngeal  Result Value Ref Range Status   MRSA by PCR NEGATIVE NEGATIVE Final    Comment:        The GeneXpert MRSA Assay (FDA approved for NASAL specimens only), is one component of a comprehensive MRSA colonization surveillance program. It is not intended to diagnose MRSA infection nor to guide or monitor treatment for MRSA infections. Performed at [facility], [place] [address] [place], [place] [address]      Labs: BNP (last 3 results) No results for input(s): BNP in the last 8760 hours. Basic Metabolic Panel: Recent Labs  Lab 07/06/19 2143  07/07/19 0214 07/07/19 0656 07/07/19 1043 07/08/19 0641  NA 137 137 136 137 137  K 4.0 3.2* 4.0 4.0 3.9  CL 101 104 105 105 100  CO2 19* 22 21* 21* 26  GLUCOSE 282* 137* 145* 144* 185*  BUN 14 13 12 11 10   CREATININE 0.85 0.75 0.69 0.74 0.81  CALCIUM 8.5* 8.4* 8.3* 8.3* 8.8*   Liver Function Tests: Recent Labs  Lab 07/06/19 1619  AST 24  ALT 31  ALKPHOS 101  BILITOT 1.5*  PROT 8.3*  ALBUMIN 4.5   Recent Labs  Lab 07/06/19 1619  LIPASE 17   No results for input(s): AMMONIA in the last 168 hours. CBC: Recent Labs  Lab 07/06/19 1619 07/06/19 1706 07/08/19 0641  WBC 13.7*  --  9.4  HGB 15.8 17.0 14.0  HCT 44.3 50.0 41.2  MCV 83.6  --  86.4  PLT 289  --  259   Cardiac Enzymes: No results for input(s): CKTOTAL, CKMB, CKMBINDEX, TROPONINI in the last 168 hours. BNP: Invalid input(s): POCBNP CBG: Recent Labs  Lab 07/07/19 1131 07/07/19 1605 07/07/19 2054 07/08/19  3151 07/08/19 1144  GLUCAP 139* 134* 119* 211* 217*   D-Dimer No results for input(s): DDIMER in the last 72 hours. Hgb A1c Recent Labs    07/06/19 2143  HGBA1C 10.1*   Lipid Profile No results for input(s): CHOL, HDL, LDLCALC, TRIG, CHOLHDL, LDLDIRECT in the last 72 hours. Thyroid function studies No results for input(s): TSH, T4TOTAL, T3FREE, THYROIDAB in the last 72 hours.  Invalid input(s): FREET3 Anemia work up No results for input(s): VITAMINB12, FOLATE, FERRITIN, TIBC, IRON, RETICCTPCT in the last 72 hours. Urinalysis    Component Value Date/Time   COLORURINE STRAW (A) 07/06/2019 1543   APPEARANCEUR CLEAR 07/06/2019 1543   LABSPEC 1.029 07/06/2019 1543   PHURINE 6.0 07/06/2019 1543   GLUCOSEU >=500 (A) 07/06/2019 1543   HGBUR NEGATIVE 07/06/2019 1543   BILIRUBINUR NEGATIVE 07/06/2019 1543   KETONESUR 80 (A) 07/06/2019 1543   PROTEINUR NEGATIVE 07/06/2019 1543   NITRITE NEGATIVE 07/06/2019 1543   LEUKOCYTESUR NEGATIVE 07/06/2019 1543   Sepsis Labs Invalid input(s):  PROCALCITONIN,  WBC,  LACTICIDVEN Microbiology Recent Results (from the past 240 hour(s))  SARS CORONAVIRUS 2 (TAT 6-24 HRS) Nasopharyngeal Nasopharyngeal Swab     Status: None   Collection Time: 07/06/19  6:07 PM   Specimen: Nasopharyngeal Swab  Result Value Ref Range Status   SARS Coronavirus 2 NEGATIVE NEGATIVE Final    Comment: (NOTE) SARS-CoV-2 target nucleic acids are NOT DETECTED. The SARS-CoV-2 RNA is generally detectable in upper and lower respiratory specimens during the acute phase of infection. Negative results do not preclude SARS-CoV-2 infection, do not rule out co-infections with other pathogens, and should not be used as the sole basis for treatment or other patient management decisions. Negative results must be combined with clinical observations, patient history, and epidemiological information. The expected result is Negative. Fact Sheet for Patients: HairSlick.no Fact Sheet for Healthcare Providers: quierodirigir.com This test is not yet approved or cleared by the [place] FDA and  has been authorized for detection and/or diagnosis of SARS-CoV-2 by FDA under an Emergency Use Authorization (EUA). This EUA will remain  in effect (meaning this test can be used) for the duration of the COVID-19 declaration under Section 56 4(b)(1) of the Act, 21 U.S.C. section 360bbb-3(b)(1), unless the authorization is terminated or revoked sooner. Performed at [facility], [address], [place], [place] [address]   MRSA PCR Screening     Status: None   Collection Time: 07/06/19  9:03 PM   Specimen: Nasal Mucosa; Nasopharyngeal  Result Value Ref Range Status   MRSA by PCR NEGATIVE NEGATIVE Final    Comment:        The GeneXpert MRSA Assay (FDA approved for NASAL specimens only), is one component of a comprehensive MRSA colonization surveillance program. It is not intended to diagnose MRSA infection nor to  guide or monitor treatment for MRSA infections. Performed at [facility], [address], [place], [place] [address]    Time spent: 30 min  SIGNED:   Rickey Barbara, MD  [facility] 07/08/2019, 12:24 PM  If 7PM-7AM, please contact night-coverage

## 2019-08-31 ENCOUNTER — Emergency Department (HOSPITAL_COMMUNITY)
Admission: EM | Admit: 2019-08-31 | Discharge: 2019-08-31 | Disposition: A | Discharge status: Home / Self Care | Payer: BC Managed Care – PPO | Attending: Emergency Medicine | Admitting: Emergency Medicine

## 2019-08-31 ENCOUNTER — Emergency Department (HOSPITAL_COMMUNITY): Payer: BC Managed Care – PPO

## 2019-08-31 ENCOUNTER — Encounter (HOSPITAL_COMMUNITY): Payer: Self-pay | Admitting: *Deleted

## 2019-08-31 ENCOUNTER — Other Ambulatory Visit: Payer: Self-pay

## 2019-08-31 DIAGNOSIS — E109 Type 1 diabetes mellitus without complications: Secondary | ICD-10-CM | POA: Diagnosis not present

## 2019-08-31 DIAGNOSIS — R112 Nausea with vomiting, unspecified: Secondary | ICD-10-CM | POA: Diagnosis not present

## 2019-08-31 DIAGNOSIS — Z79899 Other long term (current) drug therapy: Secondary | ICD-10-CM | POA: Diagnosis not present

## 2019-08-31 LAB — CBC
HCT: 44.3 % (ref 39.0–52.0)
Hemoglobin: 15.7 g/dL (ref 13.0–17.0)
MCH: 30.3 pg (ref 26.0–34.0)
MCHC: 35.4 g/dL (ref 30.0–36.0)
MCV: 85.5 fL (ref 80.0–100.0)
Platelets: 419 10*3/uL — ABNORMAL HIGH (ref 150–400)
RBC: 5.18 MIL/uL (ref 4.22–5.81)
RDW: 11.7 % (ref 11.5–15.5)
WBC: 9.9 10*3/uL (ref 4.0–10.5)
nRBC: 0 % (ref 0.0–0.2)

## 2019-08-31 LAB — COMPREHENSIVE METABOLIC PANEL
ALT: 21 U/L (ref 0–44)
AST: 19 U/L (ref 15–41)
Albumin: 4.4 g/dL (ref 3.5–5.0)
Alkaline Phosphatase: 84 U/L (ref 38–126)
Anion gap: 14 (ref 5–15)
BUN: 9 mg/dL (ref 6–20)
CO2: 30 mmol/L (ref 22–32)
Calcium: 9.5 mg/dL (ref 8.9–10.3)
Chloride: 91 mmol/L — ABNORMAL LOW (ref 98–111)
Creatinine, Ser: 0.85 mg/dL (ref 0.61–1.24)
GFR calc Af Amer: >60 mL/min (ref >60)
GFR calc non Af Amer: >60 mL/min (ref >60)
Glucose, Bld: 172 mg/dL — ABNORMAL HIGH (ref 70–99)
Potassium: 3.2 mmol/L — ABNORMAL LOW (ref 3.5–5.1)
Sodium: 135 mmol/L (ref 135–145)
Total Bilirubin: 1.1 mg/dL (ref 0.3–1.2)
Total Protein: 7.9 g/dL (ref 6.5–8.1)

## 2019-08-31 LAB — URINALYSIS, ROUTINE W REFLEX MICROSCOPIC
Bilirubin Urine: NEGATIVE
Glucose, UA: 50 mg/dL — AB
Hgb urine dipstick: NEGATIVE
Ketones, ur: 80 mg/dL — AB
Leukocytes,Ua: NEGATIVE
Nitrite: NEGATIVE
Protein, ur: 30 mg/dL — AB
Specific Gravity, Urine: 1.019 (ref 1.005–1.030)
pH: 7 (ref 5.0–8.0)

## 2019-08-31 LAB — CBG MONITORING, ED: Glucose-Capillary: 95 mg/dL (ref 70–99)

## 2019-08-31 LAB — LIPASE, BLOOD: Lipase: 20 U/L (ref 11–51)

## 2019-08-31 MED ORDER — METOCLOPRAMIDE HCL 5 MG/ML IJ SOLN
5.0000 mg | Freq: Once | INTRAMUSCULAR | Status: AC
  Administered 2019-08-31: 21:00:00 5 mg via INTRAVENOUS
  Filled 2019-08-31: qty 2

## 2019-08-31 MED ORDER — METOCLOPRAMIDE HCL 10 MG PO TABS: 10.0000 mg | ORAL_TABLET | Freq: Four times a day (QID) | ORAL | 0 refills | Status: DC

## 2019-08-31 MED ORDER — ONDANSETRON HCL 4 MG/2ML IJ SOLN
4.0000 mg | Freq: Once | INTRAMUSCULAR | Status: AC
  Administered 2019-08-31: 4 mg via INTRAVENOUS
  Filled 2019-08-31: qty 2

## 2019-08-31 MED ORDER — SODIUM CHLORIDE 0.9 % IV BOLUS: 1000.0000 mL | Freq: Once | INTRAVENOUS | Status: DC

## 2019-08-31 MED ORDER — SODIUM CHLORIDE 0.9 % IV BOLUS
2000.0000 mL | Freq: Once | INTRAVENOUS | Status: AC
  Administered 2019-08-31: 2000 mL via INTRAVENOUS

## 2019-08-31 MED ORDER — POTASSIUM CHLORIDE CRYS ER 20 MEQ PO TBCR: 20.0000 meq | EXTENDED_RELEASE_TABLET | Freq: Every day | ORAL | 0 refills | Status: DC

## 2019-08-31 MED ORDER — POTASSIUM CHLORIDE CRYS ER 20 MEQ PO TBCR
40.0000 meq | EXTENDED_RELEASE_TABLET | Freq: Once | ORAL | Status: AC
  Administered 2019-08-31: 21:00:00 40 meq via ORAL
  Filled 2019-08-31: qty 2

## 2019-08-31 MED ORDER — ACETAMINOPHEN 500 MG PO TABS
1000.0000 mg | ORAL_TABLET | Freq: Once | ORAL | Status: AC
  Administered 2019-08-31: 1000 mg via ORAL
  Filled 2019-08-31: qty 2

## 2019-08-31 MED ORDER — SODIUM CHLORIDE 0.9% FLUSH
3.0000 mL | Freq: Once | INTRAVENOUS | Status: AC
  Administered 2019-08-31: 3 mL via INTRAVENOUS

## 2019-08-31 NOTE — ED Provider Notes (Signed)
[facility]-EMERGENCY DEPT Provider Note   CSN: [id number] Arrival date & time: 08/31/19  8546     History Chief Complaint  Patient presents with  . Nausea  . Emesis  . Tachycardia    Mason Mclaughlin is a 24 y.o. male.  HPI   Pt is a 24 y/o male with a h/o DKA, who presents to the ED today for eval of NV.  Pt was admitted to [facility] for DKA earlier this week. States that upon discharge he was still feeling unwell and continuing to have NV.  States he has some mild periumbilical abdominal pain.  Denies headache, fevers, chills, urinary sxs.  He denies any cough, chest pain or pleuritic pain.  He does state that he has some intermittent shortness of breath.  He has an insulin pump.  He admits to marijuana use.  He has never been diagnosed with gastroparesis.  Past Medical History:  Diagnosis Date  . Diabetes (HCC)   . DKA (diabetic ketoacidoses) (HCC)   . DKA, type 1 (HCC)   . Insulin pump in place     Patient Active Problem List   Diagnosis Date Noted  . Hyperglycemia 07/06/2019  . Elevated blood pressure reading 07/06/2019  . ARF (acute renal failure) (HCC) 04/17/2018  . AKI (acute kidney injury) (HCC)   . Nausea and vomiting   . DKA (diabetic ketoacidoses) (HCC) 05/29/2017    History reviewed. No pertinent surgical history.     Family History  Problem Relation Age of Onset  . Healthy Mother   . Diabetes Father     Social History   Tobacco Use  . Smoking status: Never Smoker  . Smokeless tobacco: Never Used  Substance Use Topics  . Alcohol use: Yes  . Drug use: Yes    Frequency: 4.0 times per week    Types: Marijuana    Comment: last use 08/30/19    Home Medications Prior to Admission medications   Medication Sig Start Date End Date Taking? Authorizing Provider  insulin aspart (NOVOLOG) 100 UNIT/ML injection Inject 0-60 Units into the skin continuous.  04/04/19  Yes [provider]  lisinopril (ZESTRIL) 10 MG tablet  Take 10 mg by mouth daily. 08/27/19 08/26/20 Yes [provider]  metoCLOPramide (REGLAN) 10 MG tablet Take 1 tablet (10 mg total) by mouth every 6 (six) hours. 08/31/19   Caterra Ostroff S, PA-C    Allergies    Patient has no known allergies.  Review of Systems   Review of Systems  Constitutional: Positive for appetite change. Negative for fever.  HENT: Negative for ear pain and sore throat.   Eyes: Negative for pain and visual disturbance.  Respiratory: Negative for cough and shortness of breath.   Cardiovascular: Negative for chest pain and palpitations.  Gastrointestinal: Positive for abdominal pain, nausea and vomiting. Negative for blood in stool, constipation and diarrhea.  Genitourinary: Negative for dysuria and hematuria.  Musculoskeletal: Negative for back pain.  Skin: Negative for rash.  Neurological: Negative for headaches.  All other systems reviewed and are negative.   Physical Exam Updated Vital Signs BP (!) 134/91 (BP Location: Right Arm)   Pulse (!) 127   Temp 99 F (37.2 C) (Oral)   Resp 16   Ht 5\' 5"  (1.651 m)   Wt 57.2 kg   SpO2 100%   BMI 20.97 kg/m   Physical Exam Vitals and nursing note reviewed.  Constitutional:      Appearance: He is well-developed.  HENT:  Head: Normocephalic and atraumatic.  Eyes:     Conjunctiva/sclera: Conjunctivae normal.  Cardiovascular:     Rate and Rhythm: Regular rhythm. Tachycardia present.     Pulses: Normal pulses.     Heart sounds: Normal heart sounds. No murmur.  Pulmonary:     Effort: Pulmonary effort is normal. No respiratory distress.     Breath sounds: Normal breath sounds. No wheezing, rhonchi or rales.  Abdominal:     General: Bowel sounds are normal. There is no distension.     Palpations: Abdomen is soft.     Tenderness: There is abdominal tenderness (mild periumbilical ttp). There is no guarding or rebound.  Musculoskeletal:     Cervical back: Neck supple.  Skin:    General: Skin is warm  and dry.  Neurological:     Mental Status: He is alert.     ED Results / Procedures / Treatments   Labs (all labs ordered are listed, but only abnormal results are displayed) Labs Reviewed  COMPREHENSIVE METABOLIC PANEL - Abnormal; Notable for the following components:      Result Value   Potassium 3.2 (*)    Chloride 91 (*)    Glucose, Bld 172 (*)    All other components within normal limits  CBC - Abnormal; Notable for the following components:   Platelets 419 (*)    All other components within normal limits  LIPASE, BLOOD  URINALYSIS, ROUTINE W REFLEX MICROSCOPIC  CBG MONITORING, ED    EKG EKG Interpretation  Date/Time:  Friday August 31 2019 20:34:04 EDT Ventricular Rate:  98 PR Interval:    QRS Duration: 90 QT Interval:  332 QTC Calculation: 424 R Axis:   75 Text Interpretation: Sinus rhythm Short PR interval Abnormal T, consider ischemia, inferior leads nonspecific changes since previous Confirmed by Richardean Canal [phone]) on 08/31/2019 8:42:05 PM   Radiology DG Chest Portable 1 View  Result Date: 08/31/2019 CLINICAL DATA:  Shortness of breath EXAM: PORTABLE CHEST 1 VIEW COMPARISON:  04/20/2019 FINDINGS: Lungs are clear.  No pleural effusion or pneumothorax. The heart is normal in size. IMPRESSION: No evidence of acute cardiopulmonary disease. Electronically Signed   By: Charline Bills M.D.   On: 08/31/2019 20:31    Procedures Procedures (including critical care time)  Medications Ordered in ED Medications  sodium chloride flush (NS) 0.9 % injection 3 mL (3 mLs Intravenous Given 08/31/19 2033)  potassium chloride SA (KLOR-CON) CR tablet 40 mEq (40 mEq Oral Given 08/31/19 2033)  ondansetron (ZOFRAN) injection 4 mg (4 mg Intravenous Given 08/31/19 2033)  sodium chloride 0.9 % bolus 2,000 mL (2,000 mLs Intravenous New Bag/Given 08/31/19 2032)  metoCLOPramide (REGLAN) injection 5 mg (5 mg Intravenous Given 08/31/19 2033)  acetaminophen (TYLENOL) tablet 1,000 mg (1,000 mg  Oral Given 08/31/19 2033)    ED Course  I have reviewed the triage vital signs and the nursing notes.  Pertinent labs & imaging results that were available during my care of the patient were reviewed by me and considered in my medical decision making (see chart for details).  Clinical Course as of Aug 30 2132  Fri Aug 31, 2019  1904 Potassium(!): 3.2 [BS]  1929 Anion gap: 14 [BS]    Clinical Course User Index [BS] Volanda Napoleon, [place]ston Neighbor   MDM Rules/Calculators/A&P                      Patient is a 24 year old male with a history of type 1 diabetes presenting  for evaluation of nausea, vomiting.  He also has a mild periumbilical abdominal pain.  Of note, he was recently admitted to [facility] for DKA.  Records were reviewed from this admission.  During that admission he requested multiple times to be discharged.  States after discharge he continued to have nausea and vomiting.  Patient is afebrile, he is tachycardic.  Blood pressure stable and normal respiratory  Reviewed/interpreted labs CBC without leukocytosis.  Thrombocytosis present.  No anemia. CMP with mild hypokalemia 3.2.  Mild hypochloremia.  No elevated BUN or creatinine.  Normal LFTs. Lipase normal  At shift change, UA, chest x-ray and EKG pending.  Care transitioned to [facility], PA-C with plan to follow-up on pending work-up and reassessment following antiemetics.  Patient will need to pass a p.o. challenge.  His heart rate will also need to be reassessed.  Heart rate does not improve with IV fluids then further work work-up will need to be considered.  If he is able to tolerate p.o. and heart rate improves and he is stable for discharge with antiemetics.  Final Clinical Impression(s) / ED Diagnoses Final diagnoses:  Nausea and vomiting, intractability of vomiting not specified, unspecified vomiting type    Rx / DC Orders ED Discharge Orders         Ordered    metoCLOPramide (REGLAN) 10 MG tablet  Every 6  hours     08/31/19 2021           Rodney Booze, PA-C 08/31/19 2134    Drenda Freeze, MD 09/01/19 [phone]

## 2019-08-31 NOTE — Discharge Instructions (Addendum)
You were given a prescription for Reglan to help with your nausea as well as a potassium supplement to help with your low potassium noted in the ER. Please take as directed.  We have prescribed you new medication(s) today. Discuss the medications prescribed today with your pharmacist as they can have adverse effects and interactions with your other medicines including over the counter and prescribed medications. Seek medical evaluation if you start to experience new or abnormal symptoms after taking one of these medicines, seek care immediately if you start to experience difficulty breathing, feeling of your throat closing, facial swelling, or rash as these could be indications of a more serious allergic reaction  Please follow up with your primary doctor within the next 5-7 days.  If you do not have a primary care provider, information for a healthcare clinic has been provided for you to make arrangements for follow up care. Please return to the ER sooner if you have any new or worsening symptoms, or if you have any of the following symptoms:  Abdominal pain that does not go away.  You have a fever.  You keep throwing up (vomiting).  The pain is felt only in portions of the abdomen. Pain in the right side could possibly be appendicitis. In an adult, pain in the left lower portion of the abdomen could be colitis or diverticulitis.  You pass bloody or black tarry stools.  There is bright red blood in the stool.  The constipation stays for more than 4 days.  There is belly (abdominal) or rectal pain.  You do not seem to be getting better.  You have any questions or concerns.

## 2019-08-31 NOTE — ED Triage Notes (Signed)
Pt was hospitalized at [facility] this week, Type 1 diabetic, now is still nauseated and tachycardic, nurse told him to come to ED probably dehydration

## 2019-08-31 NOTE — ED Provider Notes (Signed)
21:00 assumed care of patient from Cortni Couture PA-C at change of shift pending reevaluation and disposition.  Please see prior provider note for full H&P. Replete patient is a type I diabetic with a history of prior DKA who presented to the emergency department today for evaluation of nausea, vomiting, and tachycardia.  Recently admitted for DKA at [facility].   Physical Exam  BP (!) 134/91 (BP Location: Right Arm)   Pulse (!) 127   Temp 99 F (37.2 C) (Oral)   Resp 16   Ht 5\' 5"  (1.651 m)   Wt 57.2 kg   SpO2 100%   BMI 20.97 kg/m   Physical Exam Vitals and nursing note reviewed.  Constitutional:      General: He is not in acute distress.    Appearance: He is well-developed.  HENT:     Head: Normocephalic and atraumatic.  Eyes:     General:        Right eye: No discharge.        Left eye: No discharge.     Conjunctiva/sclera: Conjunctivae normal.  Cardiovascular:     Rate and Rhythm: Normal rate.  Pulmonary:     Effort: Pulmonary effort is normal.  Neurological:     Mental Status: He is alert.     Comments: Clear speech.   Psychiatric:        Behavior: Behavior normal.        Thought Content: Thought content normal.     ED Course/Procedures   Results for orders placed or performed during the hospital encounter of 08/31/19  Lipase, blood  Result Value Ref Range   Lipase 20 11 - 51 U/L  Comprehensive metabolic panel  Result Value Ref Range   Sodium 135 135 - 145 mmol/L   Potassium 3.2 (L) 3.5 - 5.1 mmol/L   Chloride 91 (L) 98 - 111 mmol/L   CO2 30 22 - 32 mmol/L   Glucose, Bld 172 (H) 70 - 99 mg/dL   BUN 9 6 - 20 mg/dL   Creatinine, Ser 10/31/19 0.61 - 1.24 mg/dL   Calcium 9.5 8.9 - 9.62 mg/dL   Total Protein 7.9 6.5 - 8.1 g/dL   Albumin 4.4 3.5 - 5.0 g/dL   AST 19 15 - 41 U/L   ALT 21 0 - 44 U/L   Alkaline Phosphatase 84 38 - 126 U/L   Total Bilirubin 1.1 0.3 - 1.2 mg/dL   GFR calc non Af Amer >60 >60 mL/min   GFR calc Af Amer >60 >60 mL/min   Anion gap 14 5  - 15  CBC  Result Value Ref Range   WBC 9.9 4.0 - 10.5 K/uL   RBC 5.18 4.22 - 5.81 MIL/uL   Hemoglobin 15.7 13.0 - 17.0 g/dL   HCT 95.2 84.1 - 32.4 %   MCV 85.5 80.0 - 100.0 fL   MCH 30.3 26.0 - 34.0 pg   MCHC 35.4 30.0 - 36.0 g/dL   RDW 40.1 02.7 - 25.3 %   Platelets 419 (H) 150 - 400 K/uL   nRBC 0.0 0.0 - 0.2 %  Urinalysis, Routine w reflex microscopic  Result Value Ref Range   Color, Urine YELLOW YELLOW   APPearance CLEAR CLEAR   Specific Gravity, Urine 1.019 1.005 - 1.030   pH 7.0 5.0 - 8.0   Glucose, UA 50 (A) NEGATIVE mg/dL   Hgb urine dipstick NEGATIVE NEGATIVE   Bilirubin Urine NEGATIVE NEGATIVE   Ketones, ur 80 (A) NEGATIVE mg/dL  Protein, ur 30 (A) NEGATIVE mg/dL   Nitrite NEGATIVE NEGATIVE   Leukocytes,Ua NEGATIVE NEGATIVE   RBC / HPF 0-5 0 - 5 RBC/hpf   WBC, UA 0-5 0 - 5 WBC/hpf   Bacteria, UA RARE (A) NONE SEEN   Mucus PRESENT    Hyaline Casts, UA PRESENT   POC CBG, ED  Result Value Ref Range   Glucose-Capillary 95 70 - 99 mg/dL   DG Chest Portable 1 View  Result Date: 08/31/2019 CLINICAL DATA:  Shortness of breath EXAM: PORTABLE CHEST 1 VIEW COMPARISON:  04/20/2019 FINDINGS: Lungs are clear.  No pleural effusion or pneumothorax. The heart is normal in size. IMPRESSION: No evidence of acute cardiopulmonary disease. Electronically Signed   By: Julian Hy M.D.   On: 08/31/2019 20:31    Procedures MDM   Labs in the ED today not consistent with DKA. Hyperglycemia with ketonuria, however no acidosis, anion gap WNL. Receiving fluids.  Mild hypokalemia- oral replacement here.  Repeat EKG without significant change.   Patient feeling much better s/p fluids & zofran and antiemetics.  He is tolerating p.o.  Heart rate has normalized.  He is requesting discharge which I feel is reasonable at this time. Discharge instructions prepared by prior provider, added potassium supplementation & diet guidelines. I discussed results, treatment plan, need for follow-up,  and return precautions with the patient. Provided opportunity for questions, patient confirmed understanding and is in agreement with plan.   Findings and plan of care discussed with supervising physician Dr. Darl Householder who is in agreement.        Leafy Kindle 08/31/19 2255    Drenda Freeze, MD 09/01/19 Shelah Lewandowsky

## 2019-09-30 ENCOUNTER — Encounter (HOSPITAL_COMMUNITY): Payer: Self-pay | Admitting: Emergency Medicine

## 2019-09-30 ENCOUNTER — Inpatient Hospital Stay (HOSPITAL_COMMUNITY)
Admission: EM | Admit: 2019-09-30 | Discharge: 2019-10-02 | DRG: 639 | Disposition: A | Discharge status: Home / Self Care | Payer: BC Managed Care – PPO | Attending: Internal Medicine | Admitting: Internal Medicine

## 2019-09-30 ENCOUNTER — Other Ambulatory Visit: Payer: Self-pay

## 2019-09-30 DIAGNOSIS — Z20822 Contact with and (suspected) exposure to covid-19: Secondary | ICD-10-CM | POA: Diagnosis present

## 2019-09-30 DIAGNOSIS — Z833 Family history of diabetes mellitus: Secondary | ICD-10-CM

## 2019-09-30 DIAGNOSIS — Z9114 Patient's other noncompliance with medication regimen: Secondary | ICD-10-CM

## 2019-09-30 DIAGNOSIS — E1010 Type 1 diabetes mellitus with ketoacidosis without coma: Principal | ICD-10-CM | POA: Diagnosis present

## 2019-09-30 DIAGNOSIS — E1110 Type 2 diabetes mellitus with ketoacidosis without coma: Secondary | ICD-10-CM | POA: Diagnosis present

## 2019-09-30 DIAGNOSIS — R030 Elevated blood-pressure reading, without diagnosis of hypertension: Secondary | ICD-10-CM | POA: Diagnosis present

## 2019-09-30 DIAGNOSIS — R7989 Other specified abnormal findings of blood chemistry: Secondary | ICD-10-CM | POA: Diagnosis present

## 2019-09-30 DIAGNOSIS — E0810 Diabetes mellitus due to underlying condition with ketoacidosis without coma: Secondary | ICD-10-CM | POA: Diagnosis not present

## 2019-09-30 DIAGNOSIS — Z9641 Presence of insulin pump (external) (internal): Secondary | ICD-10-CM | POA: Diagnosis present

## 2019-09-30 DIAGNOSIS — Z794 Long term (current) use of insulin: Secondary | ICD-10-CM

## 2019-09-30 DIAGNOSIS — R079 Chest pain, unspecified: Secondary | ICD-10-CM | POA: Diagnosis present

## 2019-09-30 LAB — CBC
HCT: 48.3 % (ref 39.0–52.0)
HCT: 47.1 % (ref 39.0–52.0)
Hemoglobin: 17 g/dL (ref 13.0–17.0)
Hemoglobin: 16.4 g/dL (ref 13.0–17.0)
MCH: 29.6 pg (ref 26.0–34.0)
MCH: 29.4 pg (ref 26.0–34.0)
MCHC: 35.2 g/dL (ref 30.0–36.0)
MCHC: 34.8 g/dL (ref 30.0–36.0)
MCV: 84.1 fL (ref 80.0–100.0)
MCV: 84.4 fL (ref 80.0–100.0)
Platelets: 388 10*3/uL (ref 150–400)
Platelets: 367 10*3/uL (ref 150–400)
RBC: 5.74 MIL/uL (ref 4.22–5.81)
RBC: 5.58 MIL/uL (ref 4.22–5.81)
RDW: 12.7 % (ref 11.5–15.5)
RDW: 12.4 % (ref 11.5–15.5)
WBC: 10.9 10*3/uL — ABNORMAL HIGH (ref 4.0–10.5)
WBC: 12.4 10*3/uL — ABNORMAL HIGH (ref 4.0–10.5)
nRBC: 0 % (ref 0.0–0.2)
nRBC: 0 % (ref 0.0–0.2)

## 2019-09-30 LAB — POCT I-STAT EG7
Acid-base deficit: 3 mmol/L — ABNORMAL HIGH (ref 0.0–2.0)
Bicarbonate: 18.1 mmol/L — ABNORMAL LOW (ref 20.0–28.0)
Calcium, Ion: 1.1 mmol/L — ABNORMAL LOW (ref 1.15–1.40)
HCT: 52 % (ref 39.0–52.0)
Hemoglobin: 17.7 g/dL — ABNORMAL HIGH (ref 13.0–17.0)
O2 Saturation: 84 %
Potassium: 3.9 mmol/L (ref 3.5–5.1)
Sodium: 133 mmol/L — ABNORMAL LOW (ref 135–145)
TCO2: 19 mmol/L — ABNORMAL LOW (ref 22–32)
pCO2, Ven: 24.1 mmHg — ABNORMAL LOW (ref 44.0–60.0)
pH, Ven: 7.483 — ABNORMAL HIGH (ref 7.250–7.430)
pO2, Ven: 44 mmHg (ref 32.0–45.0)

## 2019-09-30 LAB — URINALYSIS, ROUTINE W REFLEX MICROSCOPIC
Bacteria, UA: NONE SEEN
Bilirubin Urine: NEGATIVE
Glucose, UA: >=500 mg/dL — AB
Hgb urine dipstick: NEGATIVE
Ketones, ur: 80 mg/dL — AB
Leukocytes,Ua: NEGATIVE
Nitrite: NEGATIVE
Protein, ur: 30 mg/dL — AB
Specific Gravity, Urine: 1.024 (ref 1.005–1.030)
pH: 6 (ref 5.0–8.0)

## 2019-09-30 LAB — COMPREHENSIVE METABOLIC PANEL
ALT: 50 U/L — ABNORMAL HIGH (ref 0–44)
AST: 44 U/L — ABNORMAL HIGH (ref 15–41)
Albumin: 4.7 g/dL (ref 3.5–5.0)
Alkaline Phosphatase: 93 U/L (ref 38–126)
Anion gap: 23 — ABNORMAL HIGH (ref 5–15)
BUN: 17 mg/dL (ref 6–20)
CO2: 16 mmol/L — ABNORMAL LOW (ref 22–32)
Calcium: 10.1 mg/dL (ref 8.9–10.3)
Chloride: 95 mmol/L — ABNORMAL LOW (ref 98–111)
Creatinine, Ser: 1.23 mg/dL (ref 0.61–1.24)
GFR calc Af Amer: >60 mL/min (ref >60)
GFR calc non Af Amer: >60 mL/min (ref >60)
Glucose, Bld: 374 mg/dL — ABNORMAL HIGH (ref 70–99)
Potassium: 4.1 mmol/L (ref 3.5–5.1)
Sodium: 134 mmol/L — ABNORMAL LOW (ref 135–145)
Total Bilirubin: 1.6 mg/dL — ABNORMAL HIGH (ref 0.3–1.2)
Total Protein: 8.6 g/dL — ABNORMAL HIGH (ref 6.5–8.1)

## 2019-09-30 LAB — I-STAT CHEM 8, ED
BUN: 19 mg/dL (ref 6–20)
Calcium, Ion: 1.1 mmol/L — ABNORMAL LOW (ref 1.15–1.40)
Chloride: 100 mmol/L (ref 98–111)
Creatinine, Ser: 1 mg/dL (ref 0.61–1.24)
Glucose, Bld: 370 mg/dL — ABNORMAL HIGH (ref 70–99)
HCT: 52 % (ref 39.0–52.0)
Hemoglobin: 17.7 g/dL — ABNORMAL HIGH (ref 13.0–17.0)
Potassium: 3.7 mmol/L (ref 3.5–5.1)
Sodium: 133 mmol/L — ABNORMAL LOW (ref 135–145)
TCO2: 20 mmol/L — ABNORMAL LOW (ref 22–32)

## 2019-09-30 LAB — BASIC METABOLIC PANEL
Anion gap: 18 — ABNORMAL HIGH (ref 5–15)
BUN: 15 mg/dL (ref 6–20)
CO2: 17 mmol/L — ABNORMAL LOW (ref 22–32)
Calcium: 10 mg/dL (ref 8.9–10.3)
Chloride: 101 mmol/L (ref 98–111)
Creatinine, Ser: 1.29 mg/dL — ABNORMAL HIGH (ref 0.61–1.24)
GFR calc Af Amer: >60 mL/min (ref >60)
GFR calc non Af Amer: >60 mL/min (ref >60)
Glucose, Bld: 251 mg/dL — ABNORMAL HIGH (ref 70–99)
Potassium: 4.6 mmol/L (ref 3.5–5.1)
Sodium: 136 mmol/L (ref 135–145)

## 2019-09-30 LAB — CBG MONITORING, ED
Glucose-Capillary: 372 mg/dL — ABNORMAL HIGH (ref 70–99)
Glucose-Capillary: 303 mg/dL — ABNORMAL HIGH (ref 70–99)
Glucose-Capillary: 259 mg/dL — ABNORMAL HIGH (ref 70–99)
Glucose-Capillary: 205 mg/dL — ABNORMAL HIGH (ref 70–99)
Glucose-Capillary: 203 mg/dL — ABNORMAL HIGH (ref 70–99)

## 2019-09-30 LAB — RESPIRATORY PANEL BY RT PCR (FLU A&B, COVID)
Influenza A by PCR: NEGATIVE
Influenza B by PCR: NEGATIVE
SARS Coronavirus 2 by RT PCR: NEGATIVE

## 2019-09-30 LAB — LIPASE, BLOOD: Lipase: 20 U/L (ref 11–51)

## 2019-09-30 LAB — BETA-HYDROXYBUTYRIC ACID
Beta-Hydroxybutyric Acid: 6.31 mmol/L — ABNORMAL HIGH (ref 0.05–0.27)
Beta-Hydroxybutyric Acid: 5.89 mmol/L — ABNORMAL HIGH (ref 0.05–0.27)

## 2019-09-30 MED ORDER — SODIUM CHLORIDE 0.9% FLUSH
3.0000 mL | Freq: Once | INTRAVENOUS | Status: AC
  Administered 2019-09-30: 3 mL via INTRAVENOUS

## 2019-09-30 MED ORDER — ONDANSETRON HCL 4 MG/2ML IJ SOLN
4.0000 mg | Freq: Three times a day (TID) | INTRAMUSCULAR | Status: DC | PRN
  Administered 2019-10-02: 4 mg via INTRAVENOUS
  Filled 2019-09-30: qty 2

## 2019-09-30 MED ORDER — LACTATED RINGERS IV BOLUS
1000.0000 mL | Freq: Once | INTRAVENOUS | Status: AC
  Administered 2019-09-30: 1000 mL via INTRAVENOUS

## 2019-09-30 MED ORDER — INSULIN REGULAR(HUMAN) IN NACL 100-0.9 UT/100ML-% IV SOLN
INTRAVENOUS | Status: DC
  Administered 2019-09-30: 8.5 [IU]/h via INTRAVENOUS
  Filled 2019-09-30: qty 100

## 2019-09-30 MED ORDER — SODIUM CHLORIDE 0.9 % IV SOLN: INTRAVENOUS | Status: DC

## 2019-09-30 MED ORDER — DEXTROSE-NACL 5-0.45 % IV SOLN: INTRAVENOUS | Status: DC

## 2019-09-30 MED ORDER — INSULIN REGULAR(HUMAN) IN NACL 100-0.9 UT/100ML-% IV SOLN
INTRAVENOUS | Status: DC
  Administered 2019-09-30: 3 [IU]/h via INTRAVENOUS

## 2019-09-30 MED ORDER — ONDANSETRON HCL 4 MG/2ML IJ SOLN
4.0000 mg | Freq: Four times a day (QID) | INTRAMUSCULAR | Status: DC | PRN
  Administered 2019-09-30: 4 mg via INTRAVENOUS
  Filled 2019-09-30: qty 2

## 2019-09-30 MED ORDER — POTASSIUM CHLORIDE 10 MEQ/100ML IV SOLN
10.0000 meq | INTRAVENOUS | Status: AC
  Administered 2019-09-30 (×2): 10 meq via INTRAVENOUS
  Filled 2019-09-30 (×2): qty 100

## 2019-09-30 MED ORDER — PROCHLORPERAZINE EDISYLATE 10 MG/2ML IJ SOLN
10.0000 mg | Freq: Four times a day (QID) | INTRAMUSCULAR | Status: DC | PRN
  Administered 2019-10-01: 10 mg via INTRAVENOUS
  Filled 2019-09-30: qty 2

## 2019-09-30 MED ORDER — DEXTROSE 50 % IV SOLN: 0.0000 mL | INTRAVENOUS | Status: DC | PRN

## 2019-09-30 MED ORDER — MELATONIN 3 MG PO TABS
6.0000 mg | ORAL_TABLET | Freq: Every evening | ORAL | Status: DC | PRN
  Administered 2019-09-30: 6 mg via ORAL
  Filled 2019-09-30: qty 2

## 2019-09-30 MED ORDER — ENOXAPARIN SODIUM 40 MG/0.4ML ~~LOC~~ SOLN
40.0000 mg | SUBCUTANEOUS | Status: DC
  Administered 2019-09-30 – 2019-10-01 (×2): 40 mg via SUBCUTANEOUS
  Filled 2019-09-30 (×2): qty 0.4

## 2019-09-30 MED ORDER — METOPROLOL TARTRATE 5 MG/5ML IV SOLN
5.0000 mg | INTRAVENOUS | Status: AC | PRN
  Administered 2019-09-30 – 2019-10-02 (×2): 5 mg via INTRAVENOUS
  Filled 2019-09-30 (×2): qty 5

## 2019-09-30 NOTE — ED Provider Notes (Signed)
[facility] Provider Note   CSN: [id number] Arrival date & time: 09/30/19  1545     History Chief Complaint  Patient presents with  . Emesis  . Chest Pain    Mason Mclaughlin is a 24 y.o. male.  HPI Patient presents for 2 days of nausea and vomiting.  Medical history notable for type 1 diabetes, hospitalizations for DKA in March/April 2021, February 2021, November 2020.  Currently has insulin pump on RLQ.  He reports that it has been functioning.  He denies any preceding fevers, chills, cough, diarrhea, or dysuria.  He has been taking Reglan at home for nausea.  Last meal was 10 PM last night.  He reports he was able to keep it down.  Today, nausea and vomiting resumed.  He has not eaten anything and has just been dry heaving.  History limited by acuity the patient's presentation and symptoms of severe discomfort.     Past Medical History:  Diagnosis Date  . Diabetes (HCC)   . DKA (diabetic ketoacidoses) (HCC)   . DKA, type 1 (HCC)   . Insulin pump in place     Patient Active Problem List   Diagnosis Date Noted  . Hyperglycemia 07/06/2019  . Elevated blood pressure reading 07/06/2019  . ARF (acute renal failure) (HCC) 04/17/2018  . AKI (acute kidney injury) (HCC)   . Nausea and vomiting   . DKA (diabetic ketoacidoses) (HCC) 05/29/2017    History reviewed. No pertinent surgical history.     Family History  Problem Relation Age of Onset  . Healthy Mother   . Diabetes Father     Social History   Tobacco Use  . Smoking status: Never Smoker  . Smokeless tobacco: Never Used  Substance Use Topics  . Alcohol use: Yes  . Drug use: Yes    Frequency: 4.0 times per week    Types: Marijuana    Comment: last use 08/30/19    Home Medications Prior to Admission medications   Medication Sig Start Date End Date Taking? Authorizing Provider  Insulin Human (INSULIN PUMP) SOLN Inject into the skin continuous. Novolog   Yes [provider]  metoCLOPramide (REGLAN) 10 MG tablet Take 1 tablet (10 mg total) by mouth every 6 (six) hours. Patient taking differently: Take 10 mg by mouth every 6 (six) hours as needed for nausea or vomiting.  08/31/19  Yes Couture, Cortni S, PA-C  Multiple Vitamin (MULTIVITAMIN WITH MINERALS) TABS tablet Take 1 tablet by mouth daily.   Yes [provider]  potassium chloride SA (KLOR-CON) 20 MEQ tablet Take 1 tablet (20 mEq total) by mouth daily. Patient not taking: Reported on 09/30/2019 08/31/19   Petrucelli, Pleas KochSamantha R, PA-C    Allergies    Patient has no known allergies.  Review of Systems   Review of Systems  Unable to perform ROS: Acuity of condition    Physical Exam Updated Vital Signs BP 114/74 (BP Location: Right Arm)   Pulse 93   Temp 98.3 F (36.8 C) (Oral)   Resp 18   Ht 5\' 5"  (1.651 m)   Wt 57.3 kg   SpO2 99%   BMI 21.03 kg/m   Physical Exam Vitals and nursing note reviewed.  Constitutional:      General: He is in acute distress.     Appearance: He is well-developed. He is ill-appearing. He is not toxic-appearing or diaphoretic.  HENT:     Head: Normocephalic and atraumatic.  Eyes:  Conjunctiva/sclera: Conjunctivae normal.     Pupils: Pupils are equal, round, and reactive to light.  Cardiovascular:     Rate and Rhythm: Regular rhythm. Tachycardia present.     Pulses:          Radial pulses are 2+ on the right side and 2+ on the left side.     Heart sounds: No murmur.  Pulmonary:     Effort: Tachypnea present. No respiratory distress.     Breath sounds: Normal breath sounds. No decreased breath sounds, wheezing, rhonchi or rales.  Chest:     Chest wall: No tenderness.  Abdominal:     Palpations: Abdomen is soft.     Tenderness: There is no abdominal tenderness. There is no guarding.  Musculoskeletal:     Cervical back: Neck supple.     Right lower leg: No edema.     Left lower leg: No edema.  Skin:    General: Skin is warm and dry.    Neurological:     General: No focal deficit present.     Mental Status: He is alert and oriented to person, place, and time.  Psychiatric:        Mood and Affect: Mood is anxious.        Behavior: Behavior is uncooperative.     ED Results / Procedures / Treatments   Labs (all labs ordered are listed, but only abnormal results are displayed) Labs Reviewed  COMPREHENSIVE METABOLIC PANEL - Abnormal; Notable for the following components:      Result Value   Sodium 134 (*)    Chloride 95 (*)    CO2 16 (*)    Glucose, Bld 374 (*)    Total Protein 8.6 (*)    AST 44 (*)    ALT 50 (*)    Total Bilirubin 1.6 (*)    Anion gap 23 (*)    All other components within normal limits  CBC - Abnormal; Notable for the following components:   WBC 10.9 (*)    All other components within normal limits  URINALYSIS, ROUTINE W REFLEX MICROSCOPIC - Abnormal; Notable for the following components:   Glucose, UA >=500 (*)    Ketones, ur 80 (*)    Protein, ur 30 (*)    All other components within normal limits  BETA-HYDROXYBUTYRIC ACID - Abnormal; Notable for the following components:   Beta-Hydroxybutyric Acid 6.31 (*)    All other components within normal limits  CBC - Abnormal; Notable for the following components:   WBC 12.4 (*)    All other components within normal limits  BASIC METABOLIC PANEL - Abnormal; Notable for the following components:   CO2 17 (*)    Glucose, Bld 251 (*)    Creatinine, Ser 1.29 (*)    Anion gap 18 (*)    All other components within normal limits  BASIC METABOLIC PANEL - Abnormal; Notable for the following components:   CO2 21 (*)    Glucose, Bld 170 (*)    All other components within normal limits  BASIC METABOLIC PANEL - Abnormal; Notable for the following components:   CO2 17 (*)    Glucose, Bld 188 (*)    Anion gap 17 (*)    All other components within normal limits  BASIC METABOLIC PANEL - Abnormal; Notable for the following components:   Potassium 3.2 (*)     CO2 21 (*)    Glucose, Bld 123 (*)    All other components within normal  limits  BETA-HYDROXYBUTYRIC ACID - Abnormal; Notable for the following components:   Beta-Hydroxybutyric Acid 5.89 (*)    All other components within normal limits  BETA-HYDROXYBUTYRIC ACID - Abnormal; Notable for the following components:   Beta-Hydroxybutyric Acid 3.66 (*)    All other components within normal limits  BASIC METABOLIC PANEL - Abnormal; Notable for the following components:   Glucose, Bld 290 (*)    All other components within normal limits  GLUCOSE, CAPILLARY - Abnormal; Notable for the following components:   Glucose-Capillary 174 (*)    All other components within normal limits  GLUCOSE, CAPILLARY - Abnormal; Notable for the following components:   Glucose-Capillary 181 (*)    All other components within normal limits  GLUCOSE, CAPILLARY - Abnormal; Notable for the following components:   Glucose-Capillary 158 (*)    All other components within normal limits  GLUCOSE, CAPILLARY - Abnormal; Notable for the following components:   Glucose-Capillary 143 (*)    All other components within normal limits  GLUCOSE, CAPILLARY - Abnormal; Notable for the following components:   Glucose-Capillary 172 (*)    All other components within normal limits  GLUCOSE, CAPILLARY - Abnormal; Notable for the following components:   Glucose-Capillary 153 (*)    All other components within normal limits  HEPATIC FUNCTION PANEL - Abnormal; Notable for the following components:   Total Bilirubin 1.6 (*)    Indirect Bilirubin 1.5 (*)    All other components within normal limits  GLUCOSE, CAPILLARY - Abnormal; Notable for the following components:   Glucose-Capillary 137 (*)    All other components within normal limits  GLUCOSE, CAPILLARY - Abnormal; Notable for the following components:   Glucose-Capillary 143 (*)    All other components within normal limits  GLUCOSE, CAPILLARY - Abnormal; Notable for the  following components:   Glucose-Capillary 141 (*)    All other components within normal limits  HEMOGLOBIN A1C - Abnormal; Notable for the following components:   Hgb A1c MFr Bld 10.2 (*)    All other components within normal limits  GLUCOSE, CAPILLARY - Abnormal; Notable for the following components:   Glucose-Capillary 247 (*)    All other components within normal limits  GLUCOSE, CAPILLARY - Abnormal; Notable for the following components:   Glucose-Capillary 290 (*)    All other components within normal limits  CBG MONITORING, ED - Abnormal; Notable for the following components:   Glucose-Capillary 372 (*)    All other components within normal limits  I-STAT CHEM 8, ED - Abnormal; Notable for the following components:   Sodium 133 (*)    Glucose, Bld 370 (*)    Calcium, Ion 1.10 (*)    TCO2 20 (*)    Hemoglobin 17.7 (*)    All other components within normal limits  POCT I-STAT EG7 - Abnormal; Notable for the following components:   pH, Ven 7.483 (*)    pCO2, Ven 24.1 (*)    Bicarbonate 18.1 (*)    TCO2 19 (*)    Acid-base deficit 3.0 (*)    Sodium 133 (*)    Calcium, Ion 1.10 (*)    Hemoglobin 17.7 (*)    All other components within normal limits  CBG MONITORING, ED - Abnormal; Notable for the following components:   Glucose-Capillary 303 (*)    All other components within normal limits  CBG MONITORING, ED - Abnormal; Notable for the following components:   Glucose-Capillary 259 (*)    All other components within normal limits  CBG MONITORING, ED - Abnormal; Notable for the following components:   Glucose-Capillary 205 (*)    All other components within normal limits  CBG MONITORING, ED - Abnormal; Notable for the following components:   Glucose-Capillary 203 (*)    All other components within normal limits  RESPIRATORY PANEL BY RT PCR (FLU A&B, COVID)  LIPASE, BLOOD  LIPASE, BLOOD  BETA-HYDROXYBUTYRIC ACID  I-STAT VENOUS BLOOD GAS, ED  TROPONIN I (HIGH  SENSITIVITY)    EKG EKG Interpretation  Date/Time:  Sunday Sep 30 2019 15:50:32 EDT Ventricular Rate:  128 PR Interval:  122 QRS Duration: 82 QT Interval:  310 QTC Calculation: 452 R Axis:   76 Text Interpretation: Sinus tachycardia T wave abnormality, consider inferolateral ischemia Abnormal ECG faster than prior 4/21 Confirmed by Meridee Score [phone]) on 09/30/2019 4:03:49 PM   Radiology No results found.  Procedures Procedures (including critical care time)  Medications Ordered in ED Medications  enoxaparin (LOVENOX) injection 40 mg (40 mg Subcutaneous Given 09/30/19 2318)  insulin regular, human (MYXREDLIN) 100 units/ 100 mL infusion (9 Units/hr Intravenous Rate/Dose Change 10/01/19 1221)  0.9 %  sodium chloride infusion (has no administration in time range)  dextrose 5 %-0.45 % sodium chloride infusion ( Intravenous New Bag/Given 09/30/19 2034)  dextrose 50 % solution 0-50 mL (has no administration in time range)  metoprolol tartrate (LOPRESSOR) injection 5 mg (5 mg Intravenous Given 09/30/19 2318)  ondansetron (ZOFRAN) injection 4 mg (has no administration in time range)  prochlorperazine (COMPAZINE) injection 10 mg (10 mg Intravenous Given 10/01/19 0010)  melatonin tablet 6 mg (6 mg Oral Given 09/30/19 2318)  hydrALAZINE (APRESOLINE) injection 10 mg (has no administration in time range)  potassium chloride SA (KLOR-CON) CR tablet 40 mEq (40 mEq Oral Given 10/01/19 1224)  insulin glargine (LANTUS) injection 25 Units (25 Units Subcutaneous Given 10/01/19 1328)  insulin aspart (novoLOG) injection 8 Units (has no administration in time range)  insulin aspart (novoLOG) injection 0-9 Units (has no administration in time range)  insulin aspart (novoLOG) injection 0-5 Units (has no administration in time range)  sodium chloride flush (NS) 0.9 % injection 3 mL (3 mLs Intravenous Given 09/30/19 1717)  lactated ringers bolus 1,000 mL (0 mLs Intravenous Stopped 09/30/19 1932)  potassium chloride  10 mEq in 100 mL IVPB (0 mEq Intravenous Stopped 09/30/19 2137)    ED Course  I have reviewed the triage vital signs and the nursing notes.  Pertinent labs & imaging results that were available during my care of the patient were reviewed by me and considered in my medical decision making (see chart for details).  Clinical Course as of Sep 30 1328  Sun Sep 30, 2019  [id number] 24 year old male history of diabetes and DKA with an insulin pump here with 2 days of vomiting chest pain.  Tachycardic on arrival looks very uncomfortable.  Initial blood gas not showing acidosis.  Getting IV fluids nausea medication labs reassessment   [MB]    Clinical Course User Index [MB] Terrilee Files, MD   MDM Rules/Calculators/A&P                      Patient is a 24 year old male with history of type 1 diabetes, medication noncompliance, and frequent hospitalizations for DKA who presents for nausea, vomiting, and severe discomfort.  Upon arrival, he is tachycardic in the range of 140.  He is ill-appearing.  Due to his continued discomfort, he is unable to provide much history.  He  is accompanied by his grandmother, who states that he has been dry heaving all day.  His last meal was last night.  He has been vomiting for at least 2 days.  Patient does deny any recent fevers, cough, dysuria, or skin changes.  He does endorse abdominal pain.  On exam, his abdomen is soft and nontender.  Lungs are clear to auscultation.  EKG shows sinus tachycardia.  Initial blood glucose was 372.  Presentation is consistent with DKA.  Patient was given 1 L bolus IVF.  Zofran was ordered for symptomatic relief of nausea.  Labs show metabolic acidosis.  Bicarb is 16, anion gap is 23.  Potassium, for now, is normal at 4.1.  pH was surprisingly slightly alkalotic at 7.48.  PCO2 was 24.1.  CBC showed mild leukocytosis of 10.9, likely secondary to persistent vomiting.  Hemoglobin was 17.0, suggestive of hemoconcentration.  UA shows ketonuria  and glucosuria.  Beta hydroxybutyrate was 6.31.  Patient is in DKA.  Continuous IVF infusion and insulin GTT was started.  Upon reassessment, patient states that he has had some mild improvement in his symptoms.  He denies any further nausea.  Heart rate now in the range of 100.  Other vital signs are normal.  At this point, he is able to provide a bit more history.  He states that he has been missing his insulin doses recently.  Blood sugar checks at home, although infrequent, have been "up-and-down".  Patient was admitted to medicine for further management.  Final Clinical Impression(s) / ED Diagnoses Final diagnoses:  Diabetic ketoacidosis without coma associated with type 1 diabetes mellitus [facility])    Rx / DC Orders ED Discharge Orders    None       Gloris Manchester, MD 10/01/19 1330    Terrilee Files, MD 10/02/19 1116

## 2019-09-30 NOTE — ED Triage Notes (Addendum)
Patient arrives to ED with complaints of emesis starting yesterday that has continued into today. Patient states he's vomited around 10 times. Patient denies abdominal pain and diarrhea. Type 1 diabetic, tachycardic in triage. Hx DKA.

## 2019-09-30 NOTE — H&P (Signed)
History and Physical    Mason Mclaughlin OZH:[id number] DOB: Jul 18, 1995 DOA: 09/30/2019  PCP: Harvel Quale, MD  Patient coming from: Home.  Chief Complaint: Nausea vomiting.  HPI: Mason Mclaughlin is a 24 y.o. male with history of diabetes mellitus type 1 presents to the ER with complaints of nausea vomiting.  Patient states over the last 2 days he has been having nausea vomiting with no abdominal pain or diarrhea.  Denies any blood in the vomitus he denies any fever chills.  Followed up with patient's primary care physician at [facility].  ED Course: In the ER patient's labs show blood glucose of 374 bicarb of 16 anion gap of 23.  Patient's AST and ALT was mildly elevated with normal lipase.  Abdomen appears benign on exam.  Patient was started on IV fluids and IV insulin for DKA admitted for further management.  Covid test was negative.  UA shows ketones.  Hyaline cast present.  Review of Systems: As per HPI, rest all negative.   Past Medical History:  Diagnosis Date  . Diabetes (HCC)   . DKA (diabetic ketoacidoses) (HCC)   . DKA, type 1 (HCC)   . Insulin pump in place     History reviewed. No pertinent surgical history.   reports that he has never smoked. He has never used smokeless tobacco. He reports current alcohol use. He reports current drug use. Frequency: 4.00 times per week. Drug: Marijuana.  No Known Allergies  Family History  Problem Relation Age of Onset  . Healthy Mother   . Diabetes Father     Prior to Admission medications   Medication Sig Start Date End Date Taking? Authorizing Provider  insulin aspart (NOVOLOG) 100 UNIT/ML injection Inject 0-60 Units into the skin continuous.  04/04/19   [provider]  lisinopril (ZESTRIL) 10 MG tablet Take 10 mg by mouth daily. 08/27/19 08/26/20  [provider]  metoCLOPramide (REGLAN) 10 MG tablet Take 1 tablet (10 mg total) by mouth every 6 (six) hours. 08/31/19   Couture, Cortni S, PA-C  potassium  chloride SA (KLOR-CON) 20 MEQ tablet Take 1 tablet (20 mEq total) by mouth daily. 08/31/19   Petrucelli, Pleas Koch, PA-C    Physical Exam: Constitutional: Moderately built and nourished. Vitals:   09/30/19 1553 09/30/19 1714 09/30/19 1730 09/30/19 1800  BP: (!) 133/109 (!) 149/112 (!) 145/95 (!) 156/95  Pulse: (!) 137 (!) 107 95 98  Resp: (!) 24 19 11 12   Temp: 98.4 F (36.9 C)     TempSrc: Oral     SpO2: 100% 100% 100% 100%  Weight: 59 kg     Height: 5\' 5"  (1.651 m)      Eyes: Anicteric no pallor. ENMT: No discharge from the ears eyes nose or mouth. Neck: No mass.  No neck rigidity. Respiratory: No rhonchi or crepitations. Cardiovascular: S1-S2 heard. Abdomen: Soft nontender bowel sounds present. Musculoskeletal: No edema. Skin: No rash. Neurologic: Alert awake oriented to time place and person.  Moves all extremities. Psychiatric: Appears normal.  Normal affect.   Labs on Admission: I have personally reviewed following labs and imaging studies  CBC: Recent Labs  Lab 09/30/19 1558 09/30/19 1605  WBC 10.9*  --   HGB 17.0 17.7*  17.7*  HCT 48.3 52.0  52.0  MCV 84.1  --   PLT 388  --    Basic Metabolic Panel: Recent Labs  Lab 09/30/19 1558 09/30/19 1605  NA 134* 133*  133*  K 4.1 3.9  3.7  CL 95* 100  CO2 16*  --   GLUCOSE 374* 370*  BUN 17 19  CREATININE 1.23 1.00  CALCIUM 10.1  --    GFR: Estimated Creatinine Clearance: 96.7 mL/min (by C-G formula based on SCr of 1 mg/dL). Liver Function Tests: Recent Labs  Lab 09/30/19 1558  AST 44*  ALT 50*  ALKPHOS 93  BILITOT 1.6*  PROT 8.6*  ALBUMIN 4.7   Recent Labs  Lab 09/30/19 1558  LIPASE 20   No results for input(s): AMMONIA in the last 168 hours. Coagulation Profile: No results for input(s): INR, PROTIME in the last 168 hours. Cardiac Enzymes: No results for input(s): CKTOTAL, CKMB, CKMBINDEX, TROPONINI in the last 168 hours. BNP (last 3 results) No results for input(s): PROBNP in the last  8760 hours. HbA1C: No results for input(s): HGBA1C in the last 72 hours. CBG: Recent Labs  Lab 09/30/19 1553 09/30/19 1920 09/30/19 2003  GLUCAP 372* 303* 259*   Lipid Profile: No results for input(s): CHOL, HDL, LDLCALC, TRIG, CHOLHDL, LDLDIRECT in the last 72 hours. Thyroid Function Tests: No results for input(s): TSH, T4TOTAL, FREET4, T3FREE, THYROIDAB in the last 72 hours. Anemia Panel: No results for input(s): VITAMINB12, FOLATE, FERRITIN, TIBC, IRON, RETICCTPCT in the last 72 hours. Urine analysis:    Component Value Date/Time   COLORURINE YELLOW 09/30/2019 1704   APPEARANCEUR CLEAR 09/30/2019 1704   LABSPEC 1.024 09/30/2019 1704   PHURINE 6.0 09/30/2019 1704   GLUCOSEU >=500 (A) 09/30/2019 1704   HGBUR NEGATIVE 09/30/2019 1704   BILIRUBINUR NEGATIVE 09/30/2019 1704   KETONESUR 80 (A) 09/30/2019 1704   PROTEINUR 30 (A) 09/30/2019 1704   NITRITE NEGATIVE 09/30/2019 1704   LEUKOCYTESUR NEGATIVE 09/30/2019 1704   Sepsis Labs: @LABRCNTIP (procalcitonin:4,lacticidven:4) )No results found for this or any previous visit (from the past 240 hour(s)).   Radiological Exams on Admission: No results found.  EKG: Independently reviewed.  Sinus tachycardia.  Assessment/Plan Principal Problem:   DKA (diabetic ketoacidoses) (HCC) Active Problems:   Elevated blood pressure reading    1. Diabetic ketoacidosis -check hemoglobin A1c.  Patient stated he did miss his insulin.  Could be from noncompliance patient's presenting with DKA.  Per report patient is supposed to be on insulin pump.  Presently patient on IV insulin infusion fluids and closely follow metabolic panel. 2. Nausea vomiting with elevated LFTs.  Nausea vomiting could be from diabetic gastroparesis.  Abdomen appears benign on exam.  If LFTs or lipase shows worsening trend then may need further imaging. 3. Elevated blood pressure reading will keep patient n.p.o. IV hydralazine.  Patient does take lisinopril at  home.   DVT prophylaxis: Lovenox. Code Status: Full code. Family Communication: Discussed with patient. Disposition Plan: Home. Consults called: None. Admission status: Observation.   Rise Patience MD [facility] Pager [phone].  If 7PM-7AM, please contact night-coverage [email or web address] Password [email or web address]  09/30/2019, 8:05 PM

## 2019-09-30 NOTE — Progress Notes (Signed)
Patient arrived to the floor alert and oriented, Cardiac monitoring initiated we'll continue to monitor.

## 2019-10-01 DIAGNOSIS — Z20822 Contact with and (suspected) exposure to covid-19: Secondary | ICD-10-CM | POA: Diagnosis present

## 2019-10-01 DIAGNOSIS — R030 Elevated blood-pressure reading, without diagnosis of hypertension: Secondary | ICD-10-CM | POA: Diagnosis present

## 2019-10-01 DIAGNOSIS — E1010 Type 1 diabetes mellitus with ketoacidosis without coma: Secondary | ICD-10-CM | POA: Diagnosis present

## 2019-10-01 DIAGNOSIS — E0810 Diabetes mellitus due to underlying condition with ketoacidosis without coma: Secondary | ICD-10-CM | POA: Diagnosis not present

## 2019-10-01 DIAGNOSIS — Z9641 Presence of insulin pump (external) (internal): Secondary | ICD-10-CM | POA: Diagnosis present

## 2019-10-01 DIAGNOSIS — Z833 Family history of diabetes mellitus: Secondary | ICD-10-CM | POA: Diagnosis not present

## 2019-10-01 DIAGNOSIS — Z794 Long term (current) use of insulin: Secondary | ICD-10-CM | POA: Diagnosis not present

## 2019-10-01 DIAGNOSIS — R079 Chest pain, unspecified: Secondary | ICD-10-CM | POA: Diagnosis present

## 2019-10-01 DIAGNOSIS — R7989 Other specified abnormal findings of blood chemistry: Secondary | ICD-10-CM | POA: Diagnosis present

## 2019-10-01 DIAGNOSIS — Z9114 Patient's other noncompliance with medication regimen: Secondary | ICD-10-CM | POA: Diagnosis not present

## 2019-10-01 LAB — BASIC METABOLIC PANEL
Anion gap: 12 (ref 5–15)
Anion gap: 12 (ref 5–15)
Anion gap: 15 (ref 5–15)
Anion gap: 17 — ABNORMAL HIGH (ref 5–15)
BUN: 11 mg/dL (ref 6–20)
BUN: 11 mg/dL (ref 6–20)
BUN: 13 mg/dL (ref 6–20)
BUN: 14 mg/dL (ref 6–20)
CO2: 17 mmol/L — ABNORMAL LOW (ref 22–32)
CO2: 21 mmol/L — ABNORMAL LOW (ref 22–32)
CO2: 21 mmol/L — ABNORMAL LOW (ref 22–32)
CO2: 23 mmol/L (ref 22–32)
Calcium: 9.1 mg/dL (ref 8.9–10.3)
Calcium: 9.2 mg/dL (ref 8.9–10.3)
Calcium: 9.3 mg/dL (ref 8.9–10.3)
Calcium: 9.8 mg/dL (ref 8.9–10.3)
Chloride: 100 mmol/L (ref 98–111)
Chloride: 101 mmol/L (ref 98–111)
Chloride: 103 mmol/L (ref 98–111)
Chloride: 105 mmol/L (ref 98–111)
Creatinine, Ser: 0.87 mg/dL (ref 0.61–1.24)
Creatinine, Ser: 0.97 mg/dL (ref 0.61–1.24)
Creatinine, Ser: 1.08 mg/dL (ref 0.61–1.24)
Creatinine, Ser: 1.15 mg/dL (ref 0.61–1.24)
GFR calc Af Amer: >60 mL/min (ref >60)
GFR calc Af Amer: >60 mL/min (ref >60)
GFR calc Af Amer: >60 mL/min (ref >60)
GFR calc Af Amer: >60 mL/min (ref >60)
GFR calc non Af Amer: >60 mL/min (ref >60)
GFR calc non Af Amer: >60 mL/min (ref >60)
GFR calc non Af Amer: >60 mL/min (ref >60)
GFR calc non Af Amer: >60 mL/min (ref >60)
Glucose, Bld: 123 mg/dL — ABNORMAL HIGH (ref 70–99)
Glucose, Bld: 170 mg/dL — ABNORMAL HIGH (ref 70–99)
Glucose, Bld: 188 mg/dL — ABNORMAL HIGH (ref 70–99)
Glucose, Bld: 290 mg/dL — ABNORMAL HIGH (ref 70–99)
Potassium: 3.2 mmol/L — ABNORMAL LOW (ref 3.5–5.1)
Potassium: 3.8 mmol/L (ref 3.5–5.1)
Potassium: 3.9 mmol/L (ref 3.5–5.1)
Potassium: 4.2 mmol/L (ref 3.5–5.1)
Sodium: 135 mmol/L (ref 135–145)
Sodium: 137 mmol/L (ref 135–145)
Sodium: 137 mmol/L (ref 135–145)
Sodium: 138 mmol/L (ref 135–145)

## 2019-10-01 LAB — GLUCOSE, CAPILLARY
Glucose-Capillary: 137 mg/dL — ABNORMAL HIGH (ref 70–99)
Glucose-Capillary: 141 mg/dL — ABNORMAL HIGH (ref 70–99)
Glucose-Capillary: 143 mg/dL — ABNORMAL HIGH (ref 70–99)
Glucose-Capillary: 143 mg/dL — ABNORMAL HIGH (ref 70–99)
Glucose-Capillary: 151 mg/dL — ABNORMAL HIGH (ref 70–99)
Glucose-Capillary: 153 mg/dL — ABNORMAL HIGH (ref 70–99)
Glucose-Capillary: 158 mg/dL — ABNORMAL HIGH (ref 70–99)
Glucose-Capillary: 172 mg/dL — ABNORMAL HIGH (ref 70–99)
Glucose-Capillary: 173 mg/dL — ABNORMAL HIGH (ref 70–99)
Glucose-Capillary: 174 mg/dL — ABNORMAL HIGH (ref 70–99)
Glucose-Capillary: 181 mg/dL — ABNORMAL HIGH (ref 70–99)
Glucose-Capillary: 247 mg/dL — ABNORMAL HIGH (ref 70–99)
Glucose-Capillary: 273 mg/dL — ABNORMAL HIGH (ref 70–99)
Glucose-Capillary: 290 mg/dL — ABNORMAL HIGH (ref 70–99)
Glucose-Capillary: 376 mg/dL — ABNORMAL HIGH (ref 70–99)

## 2019-10-01 LAB — HEPATIC FUNCTION PANEL
ALT: 37 U/L (ref 0–44)
AST: 26 U/L (ref 15–41)
Albumin: 3.7 g/dL (ref 3.5–5.0)
Alkaline Phosphatase: 74 U/L (ref 38–126)
Bilirubin, Direct: 0.1 mg/dL (ref 0.0–0.2)
Indirect Bilirubin: 1.5 mg/dL — ABNORMAL HIGH (ref 0.3–0.9)
Total Bilirubin: 1.6 mg/dL — ABNORMAL HIGH (ref 0.3–1.2)
Total Protein: 7.1 g/dL (ref 6.5–8.1)

## 2019-10-01 LAB — BETA-HYDROXYBUTYRIC ACID
Beta-Hydroxybutyric Acid: 3.66 mmol/L — ABNORMAL HIGH (ref 0.05–0.27)
Beta-Hydroxybutyric Acid: 2.29 mmol/L — ABNORMAL HIGH (ref 0.05–0.27)

## 2019-10-01 LAB — LIPASE, BLOOD: Lipase: 20 U/L (ref 11–51)

## 2019-10-01 LAB — TROPONIN I (HIGH SENSITIVITY): Troponin I (High Sensitivity): 6 ng/L (ref <18)

## 2019-10-01 LAB — HEMOGLOBIN A1C
Hgb A1c MFr Bld: 10.2 % — ABNORMAL HIGH (ref 4.8–5.6)
Mean Plasma Glucose: 246.04 mg/dL

## 2019-10-01 MED ORDER — INSULIN ASPART 100 UNIT/ML ~~LOC~~ SOLN
0.0000 [IU] | Freq: Every day | SUBCUTANEOUS | Status: DC
  Administered 2019-10-01: 3 [IU] via SUBCUTANEOUS

## 2019-10-01 MED ORDER — HYDRALAZINE HCL 20 MG/ML IJ SOLN
10.0000 mg | INTRAMUSCULAR | Status: DC | PRN
  Administered 2019-10-02: 10 mg via INTRAVENOUS
  Filled 2019-10-01: qty 1

## 2019-10-01 MED ORDER — INSULIN GLARGINE 100 UNIT/ML ~~LOC~~ SOLN
25.0000 [IU] | Freq: Every day | SUBCUTANEOUS | Status: DC
  Administered 2019-10-01: 25 [IU] via SUBCUTANEOUS
  Filled 2019-10-01 (×2): qty 0.25

## 2019-10-01 MED ORDER — INSULIN ASPART 100 UNIT/ML ~~LOC~~ SOLN
8.0000 [IU] | Freq: Three times a day (TID) | SUBCUTANEOUS | Status: DC
  Administered 2019-10-01 – 2019-10-02 (×2): 8 [IU] via SUBCUTANEOUS

## 2019-10-01 MED ORDER — INSULIN ASPART 100 UNIT/ML ~~LOC~~ SOLN
0.0000 [IU] | Freq: Three times a day (TID) | SUBCUTANEOUS | Status: DC
  Administered 2019-10-01 – 2019-10-02 (×3): 2 [IU] via SUBCUTANEOUS

## 2019-10-01 MED ORDER — POTASSIUM CHLORIDE CRYS ER 20 MEQ PO TBCR
40.0000 meq | EXTENDED_RELEASE_TABLET | Freq: Two times a day (BID) | ORAL | Status: AC
  Administered 2019-10-01 (×2): 40 meq via ORAL
  Filled 2019-10-01 (×2): qty 2

## 2019-10-01 NOTE — Plan of Care (Signed)
POC initiated and progressing. 

## 2019-10-01 NOTE — Progress Notes (Signed)
Inpatient Diabetes Program Recommendations  AACE/ADA: New Consensus Statement on Inpatient Glycemic Control (2015)  Target Ranges:  Prepandial:   less than 140 mg/dL      Peak postprandial:   less than 180 mg/dL (1-2 hours)      Critically ill patients:  140 - 180 mg/dL   Lab Results  Component Value Date   GLUCAP 141 (H) 10/01/2019   HGBA1C 10.1 (H) 07/06/2019    Review of Glycemic Control  Diabetes history: DM1 Outpatient Diabetes medications: Insulin pump - T-slim Current orders for Inpatient glycemic control: IV insulin  Inpatient Diabetes Program Recommendations:     Lantus 25 units QD Novolog 0-9 units tidwc and hs Novolog 8 units tidwc for meal coverage insulin.  Discussed HgbA1C of 10.1% with pt. Insulin Pump - Total basal 33.25 units/day CHO ratio 1:3    CF 15  Pt does not have supplies here in hospital. Will restart pump when discharged.  Discussed with RN.  Thank you. Ailene Ards, RD, LDN, CDE Inpatient Diabetes Coordinator [phone]

## 2019-10-01 NOTE — Progress Notes (Signed)
PROGRESS NOTE    Mason Mclaughlin  WNU:[id number] DOB: 01-24-1996 DOA: 09/30/2019 PCP: Everrett Coombe, MD    Chief Complaint  Patient presents with  . Emesis  . Chest Pain    Brief Narrative:  Mason Mclaughlin is a 24 y.o. male with history of diabetes mellitus type 1 presents to the ER with complaints of nausea vomiting. On arrival to ED, he was found to be in mild DKA. He was started on IV insulin gtt, AG IS closed with improvement in the bicarb, but pt still is nauseated, and still on clear liquid diet.  Transitioned to lantus today and continue with SSI.   Assessment & Plan:   Principal Problem:   DKA (diabetic ketoacidoses) ([place]) Active Problems:   Elevated blood pressure reading   DKA Resolved patient is currently on Lantus 25 units daily and sliding scale insulin Her hemoglobin A1c is 10.2. Patient started on clear liquid diet, patient reports he is still nauseated and does not want to advance diet at this time for fear of vomiting.  Continue with IV fluids for now.    DVT prophylaxis: Lovenox Code Status: full code.  Family Communication: none at bedside.  Disposition:   Status is: Observation  The patient will require care spanning > 2 midnights and should be moved to inpatient because: IV treatments appropriate due to intensity of illness or inability to take PO  Dispo: The patient is from: Home              Anticipated d/c is to: Home              Anticipated d/c date is: 1 day              Patient currently is not medically stable to d/c.       Consultants:   Diabetes Coordinator.    Procedures: none.    Antimicrobials: none.    Subjective: No new complaints.   Objective: Vitals:   09/30/19 2334 10/01/19 0411 10/01/19 0730 10/01/19 1104  BP: (!) 135/98 102/63 122/72 114/74  Pulse: 97 100 97 93  Resp: 19 17 18 18   Temp: 98.8 F (37.1 C) 98.2 F (36.8 C) 98 F (36.7 C) 98.3 F (36.8 C)  TempSrc: Oral Oral Oral Oral  SpO2: 98%  98% 99% 99%  Weight:      Height:        Intake/Output Summary (Last 24 hours) at 10/01/2019 1253 Last data filed at 10/01/2019 1008 Gross per 24 hour  Intake 2059.17 ml  Output 550 ml  Net 1509.17 ml   Filed Weights   09/30/19 1553 09/30/19 2218  Weight: 59 kg 57.3 kg    Examination:  General exam: Appears calm and comfortable  Respiratory system: Clear to auscultation. Respiratory effort normal. Cardiovascular system: S1 & S2 heard, RRR. No JVD, murmurs,  No pedal edema. Gastrointestinal system: Abdomen is nondistended, soft and nontender.Normal bowel sounds heard. Central nervous system: Alert and oriented. No focal neurological deficits. Extremities: Symmetric 5 x 5 power. Skin: No rashes, lesions or ulcers Psychiatry:  Mood & affect appropriate.     Data Reviewed: I have personally reviewed following labs and imaging studies  CBC: Recent Labs  Lab 09/30/19 1558 09/30/19 1605 09/30/19 2012  WBC 10.9*  --  12.4*  HGB 17.0 17.7*  17.7* 16.4  HCT 48.3 52.0  52.0 47.1  MCV 84.1  --  84.4  PLT 388  --  034    Basic Metabolic Panel: Recent  Labs  Lab 09/30/19 2012 09/30/19 2359 10/01/19 0401 10/01/19 0713 10/01/19 1128  NA 136 137 137 138 135  K 4.6 4.2 3.8 3.2* 3.9  CL 101 101 103 105 100  CO2 17* 21* 17* 21* 23  GLUCOSE 251* 170* 188* 123* 290*  BUN 15 14 13 11 11   CREATININE 1.29* 1.15 0.97 0.87 1.08  CALCIUM 10.0 9.8 9.3 9.2 9.1    GFR: Estimated Creatinine Clearance: 87 mL/min (by C-G formula based on SCr of 1.08 mg/dL).  Liver Function Tests: Recent Labs  Lab 09/30/19 1558 10/01/19 0713  AST 44* 26  ALT 50* 37  ALKPHOS 93 74  BILITOT 1.6* 1.6*  PROT 8.6* 7.1  ALBUMIN 4.7 3.7    CBG: Recent Labs  Lab 10/01/19 0753 10/01/19 0900 10/01/19 1000 10/01/19 1102 10/01/19 1208  GLUCAP 137* 143* 141* 247* 290*     Recent Results (from the past 240 hour(s))  Respiratory Panel by RT PCR (Flu A&B, Covid) - Nasopharyngeal Swab      Status: None   Collection Time: 09/30/19  8:04 PM   Specimen: Nasopharyngeal Swab  Result Value Ref Range Status   SARS Coronavirus 2 by RT PCR NEGATIVE NEGATIVE Final    Comment: (NOTE) SARS-CoV-2 target nucleic acids are NOT DETECTED. The SARS-CoV-2 RNA is generally detectable in upper respiratoy specimens during the acute phase of infection. The lowest concentration of SARS-CoV-2 viral copies this assay can detect is 131 copies/mL. A negative result does not preclude SARS-Cov-2 infection and should not be used as the sole basis for treatment or other patient management decisions. A negative result may occur with  improper specimen collection/handling, submission of specimen other than nasopharyngeal swab, presence of viral mutation(s) within the areas targeted by this assay, and inadequate number of viral copies (<131 copies/mL). A negative result must be combined with clinical observations, patient history, and epidemiological information. The expected result is Negative. Fact Sheet for Patients:  11/30/19 Fact Sheet for Healthcare Providers:  https://www.moore.com/ This test is not yet ap proved or cleared by the https://www.young.biz/ FDA and  has been authorized for detection and/or diagnosis of SARS-CoV-2 by FDA under an Emergency Use Authorization (EUA). This EUA will remain  in effect (meaning this test can be used) for the duration of the COVID-19 declaration under Section 564(b)(1) of the Act, 21 U.S.C. section 360bbb-3(b)(1), unless the authorization is terminated or revoked sooner.    Influenza A by PCR NEGATIVE NEGATIVE Final   Influenza B by PCR NEGATIVE NEGATIVE Final    Comment: (NOTE) The Xpert Xpress SARS-CoV-2/FLU/RSV assay is intended as an aid in  the diagnosis of influenza from Nasopharyngeal swab specimens and  should not be used as a sole basis for treatment. Nasal washings and  aspirates are unacceptable for  Xpert Xpress SARS-CoV-2/FLU/RSV  testing. Fact Sheet for Patients: [place] Fact Sheet for Healthcare Providers: https://www.moore.com/ This test is not yet approved or cleared by the https://www.young.biz/ FDA and  has been authorized for detection and/or diagnosis of SARS-CoV-2 by  FDA under an Emergency Use Authorization (EUA). This EUA will remain  in effect (meaning this test can be used) for the duration of the  Covid-19 declaration under Section 564(b)(1) of the Act, 21  U.S.C. section 360bbb-3(b)(1), unless the authorization is  terminated or revoked. Performed at [facility], [address] [place], [place]          Radiology Studies: No results found.      Scheduled  Meds: . enoxaparin (LOVENOX) injection  40 mg Subcutaneous Q24H  . insulin aspart  0-5 Units Subcutaneous QHS  . insulin aspart  0-9 Units Subcutaneous TID WC  . insulin aspart  8 Units Subcutaneous TID WC  . insulin glargine  25 Units Subcutaneous Daily  . potassium chloride  40 mEq Oral BID   Continuous Infusions: . sodium chloride    . dextrose 5 % and 0.45% NaCl 75 mL/hr at 09/30/19 2034  . insulin 9 Units/hr (10/01/19 1221)     LOS: 0 days       Kathlen Mody, MD [facility]   To contact the attending provider between 7A-7P or the covering provider during after hours 7P-7A, please log into the web site [email or web address] and access using universal [place] password for that web site. If you do not have the password, please call the hospital operator.  10/01/2019, 12:53 PM

## 2019-10-02 DIAGNOSIS — E1010 Type 1 diabetes mellitus with ketoacidosis without coma: Principal | ICD-10-CM

## 2019-10-02 LAB — CBC
HCT: 42.1 % (ref 39.0–52.0)
Hemoglobin: 14.7 g/dL (ref 13.0–17.0)
MCH: 29.5 pg (ref 26.0–34.0)
MCHC: 34.9 g/dL (ref 30.0–36.0)
MCV: 84.5 fL (ref 80.0–100.0)
Platelets: 333 10*3/uL (ref 150–400)
RBC: 4.98 MIL/uL (ref 4.22–5.81)
RDW: 12.3 % (ref 11.5–15.5)
WBC: 11.1 10*3/uL — ABNORMAL HIGH (ref 4.0–10.5)
nRBC: 0 % (ref 0.0–0.2)

## 2019-10-02 LAB — GLUCOSE, CAPILLARY
Glucose-Capillary: 191 mg/dL — ABNORMAL HIGH (ref 70–99)
Glucose-Capillary: 181 mg/dL — ABNORMAL HIGH (ref 70–99)
Glucose-Capillary: 223 mg/dL — ABNORMAL HIGH (ref 70–99)

## 2019-10-02 LAB — BASIC METABOLIC PANEL
Anion gap: 10 (ref 5–15)
BUN: 9 mg/dL (ref 6–20)
CO2: 26 mmol/L (ref 22–32)
Calcium: 8.9 mg/dL (ref 8.9–10.3)
Chloride: 100 mmol/L (ref 98–111)
Creatinine, Ser: 0.98 mg/dL (ref 0.61–1.24)
GFR calc Af Amer: >60 mL/min (ref >60)
GFR calc non Af Amer: >60 mL/min (ref >60)
Glucose, Bld: 184 mg/dL — ABNORMAL HIGH (ref 70–99)
Potassium: 3.3 mmol/L — ABNORMAL LOW (ref 3.5–5.1)
Sodium: 136 mmol/L (ref 135–145)

## 2019-10-02 MED ORDER — POTASSIUM CHLORIDE CRYS ER 20 MEQ PO TBCR
40.0000 meq | EXTENDED_RELEASE_TABLET | Freq: Once | ORAL | Status: AC
  Administered 2019-10-02: 40 meq via ORAL
  Filled 2019-10-02: qty 2

## 2019-10-02 MED ORDER — METOPROLOL TARTRATE 5 MG/5ML IV SOLN: 5.0000 mg | Freq: Three times a day (TID) | INTRAVENOUS | Status: DC

## 2019-10-02 NOTE — Progress Notes (Signed)
RN to assess pt. Pt BP 149/113, HR 130-140s. Pt states feeling nauseous. IV Zofran given. Girlfriend at bedside. RN encouraged pt to relax and take deep breaths. IV metoprolol given. MD notified. Will continue to monitor.  Hazle Nordmann, RN

## 2019-10-02 NOTE — Plan of Care (Signed)
  Problem: Clinical Measurements: Goal: Will remain free from infection Outcome: Progressing Goal: Diagnostic test results will improve Outcome: Progressing   

## 2019-10-02 NOTE — ED Provider Notes (Signed)
.  Critical Care Performed by: Terrilee Files, MD Authorized by: Terrilee Files, MD   Critical care provider statement:    Critical care time (minutes):  45   Critical care time was exclusive of:  Separately billable procedures and treating other patients   Critical care was necessary to treat or prevent imminent or life-threatening deterioration of the following conditions:  Metabolic crisis   Critical care was time spent personally by me on the following activities:  Discussions with consultants, evaluation of patient's response to treatment, examination of patient, ordering and performing treatments and interventions, ordering and review of laboratory studies, ordering and review of radiographic studies, pulse oximetry, re-evaluation of patient's condition, obtaining history from patient or surrogate, review of old charts and development of treatment plan with patient or surrogate   I assumed direction of critical care for this patient from another provider in my specialty: no       Terrilee Files, MD 10/02/19 1117

## 2019-10-02 NOTE — Progress Notes (Signed)
Discharge instructions given to patient. IV removed, clean and intact. VSS. Medications reviewed. All questions answered. Pt escorted home with girlfriend.  Hazle Nordmann, RN

## 2019-10-02 NOTE — Plan of Care (Signed)
  Problem: Education: Goal: Knowledge of General Education information will improve Description: Including pain rating scale, medication(s)/side effects and non-pharmacologic comfort measures Outcome: Adequate for Discharge   Problem: Health Behavior/Discharge Planning: Goal: Ability to manage health-related needs will improve Outcome: Adequate for Discharge   Problem: Clinical Measurements: Goal: Ability to maintain clinical measurements within normal limits will improve Outcome: Adequate for Discharge Goal: Will remain free from infection Outcome: Adequate for Discharge Goal: Diagnostic test results will improve Outcome: Adequate for Discharge Goal: Respiratory complications will improve Outcome: Adequate for Discharge Goal: Cardiovascular complication will be avoided Outcome: Adequate for Discharge   Problem: Activity: Goal: Risk for activity intolerance will decrease Outcome: Adequate for Discharge   Problem: Nutrition: Goal: Adequate nutrition will be maintained Outcome: Adequate for Discharge   Problem: Coping: Goal: Level of anxiety will decrease Outcome: Adequate for Discharge   Problem: Elimination: Goal: Will not experience complications related to bowel motility Outcome: Adequate for Discharge Goal: Will not experience complications related to urinary retention Outcome: Adequate for Discharge   Problem: Pain Managment: Goal: General experience of comfort will improve Outcome: Adequate for Discharge   Problem: Safety: Goal: Ability to remain free from injury will improve Outcome: Adequate for Discharge   Problem: Skin Integrity: Goal: Risk for impaired skin integrity will decrease Outcome: Adequate for Discharge   Problem: Cardiac: Goal: Ability to maintain an adequate cardiac output will improve Outcome: Adequate for Discharge   Problem: Fluid Volume: Goal: Ability to achieve a balanced intake and output will improve Outcome: Adequate for  Discharge   Problem: Metabolic: Goal: Ability to maintain appropriate glucose levels will improve Outcome: Adequate for Discharge   Problem: Nutritional: Goal: Maintenance of adequate nutrition will improve Outcome: Adequate for Discharge Goal: Maintenance of adequate weight for body size and type will improve Outcome: Adequate for Discharge   Problem: Respiratory: Goal: Will regain and/or maintain adequate ventilation Outcome: Adequate for Discharge   Problem: Urinary Elimination: Goal: Ability to achieve and maintain adequate renal perfusion and functioning will improve Outcome: Adequate for Discharge

## 2019-10-10 NOTE — Discharge Summary (Signed)
Physician Discharge Summary  Mason Mclaughlin NLZ:[id number] DOB: [birth year] DOA: 09/30/2019  PCP: Harvel Quale, MD  Admit date: 09/30/2019 Discharge date: 10/02/2019  Admitted From: Home.  Disposition:  Home  Recommendations for Outpatient Follow-up:  1. Follow up with PCP in 1-2 weeks 2. Please obtain BMP/CBC in one week 3. Please follow up  With endocrinologist in 1 to 2 weeks.     Discharge Condition:STABLE. CODE STATUS: FULL CODE Diet recommendation: Heart Healthy  Brief/Interim Summary: Mason Mclaughlin a 23 y.o.malewithhistory of diabetes mellitus type 1 presents to the ER with complaints of nausea vomiting. On arrival to ED, he was found to be in mild DKA. He was started on IV insulin gtt, AG IS closed with improvement in the bicarb, . HE was transitioned to home dose of insulin pump on discharge.   Discharge Diagnoses:  Principal Problem:   DKA (diabetic ketoacidoses) ([facility]) Active Problems:   Elevated blood pressure reading  DKA Resolved patient is currently on Lantus 25 units daily and sliding scale insulin His hemoglobin A1c is 10.2. recommend d/c on insulin pump .  No signs of infection .  He was started on clears and advanced to regular diet on discharge.   Discharge Instructions  Discharge Instructions    Diet - low sodium heart healthy   Complete by: As directed    Discharge instructions   Complete by: As directed    PLEASE FOLLOW UP WITH PCP IN ONE WEEK.     Allergies as of 10/02/2019   No Known Allergies     Medication List    TAKE these medications   insulin pump Soln Inject into the skin continuous. Novolog   metoCLOPramide 10 MG tablet Commonly known as: REGLAN Take 1 tablet (10 mg total) by mouth every 6 (six) hours. What changed:   when to take this  reasons to take this   multivitamin with minerals Tabs tablet Take 1 tablet by mouth daily.   potassium chloride SA 20 MEQ tablet Commonly known as: KLOR-CON Take 1 tablet  (20 mEq total) by mouth daily.      Follow-up Information    Shariff, Charise Killian, MD. Schedule an appointment as soon as possible for a visit in 1 week(s).   Specialty: Endocrinology Contact information: [address] [place] [address] [phone]          No Known Allergies  Consultations:  DM co ordinator.    Procedures/Studies:  No results found.    Subjective: No new complaints.   Discharge Exam: Vitals:   10/02/19 1341 10/02/19 1538  BP: 136/89 140/89  Pulse: 93 100  Resp: 14 14  Temp: 98.2 F (36.8 C) 98.2 F (36.8 C)  SpO2: 100% 100%   Vitals:   10/02/19 1214 10/02/19 1257 10/02/19 1341 10/02/19 1538  BP: (!) 160/122 (!) 149/113 136/89 140/89  Pulse:  (!) 125 93 100  Resp:  17 14 14   Temp:  98 F (36.7 C) 98.2 F (36.8 C) 98.2 F (36.8 C)  TempSrc:  Oral Oral Oral  SpO2:  100% 100% 100%  Weight:      Height:        General: Pt is alert, awake, not in acute distress Cardiovascular: RRR, S1/S2 +, no rubs, no gallops Respiratory: CTA bilaterally, no wheezing, no rhonchi Abdominal: Soft, NT, ND, bowel sounds + Extremities: no edema, no cyanosis    The results of significant diagnostics from this hospitalization (including imaging, microbiology, ancillary and laboratory) are listed below for  reference.     Microbiology: Recent Results (from the past 240 hour(s))  Respiratory Panel by RT PCR (Flu A&B, Covid) - Nasopharyngeal Swab     Status: None   Collection Time: 09/30/19  8:04 PM   Specimen: Nasopharyngeal Swab  Result Value Ref Range Status   SARS Coronavirus 2 by RT PCR NEGATIVE NEGATIVE Final    Comment: (NOTE) SARS-CoV-2 target nucleic acids are NOT DETECTED. The SARS-CoV-2 RNA is generally detectable in upper respiratoy specimens during the acute phase of infection. The lowest concentration of SARS-CoV-2 viral copies this assay can detect is 131 copies/mL. A negative result does not preclude SARS-Cov-2 infection  and should not be used as the sole basis for treatment or other patient management decisions. A negative result may occur with  improper specimen collection/handling, submission of specimen other than nasopharyngeal swab, presence of viral mutation(s) within the areas targeted by this assay, and inadequate number of viral copies (<131 copies/mL). A negative result must be combined with clinical observations, patient history, and epidemiological information. The expected result is Negative. Fact Sheet for Patients:  https://www.moore.com/ Fact Sheet for Healthcare Providers:  https://www.young.biz/ This test is not yet ap proved or cleared by the [place] FDA and  has been authorized for detection and/or diagnosis of SARS-CoV-2 by FDA under an Emergency Use Authorization (EUA). This EUA will remain  in effect (meaning this test can be used) for the duration of the COVID-19 declaration under Section 564(b)(1) of the Act, 21 U.S.C. section 360bbb-3(b)(1), unless the authorization is terminated or revoked sooner.    Influenza A by PCR NEGATIVE NEGATIVE Final   Influenza B by PCR NEGATIVE NEGATIVE Final    Comment: (NOTE) The Xpert Xpress SARS-CoV-2/FLU/RSV assay is intended as an aid in  the diagnosis of influenza from Nasopharyngeal swab specimens and  should not be used as a sole basis for treatment. Nasal washings and  aspirates are unacceptable for Xpert Xpress SARS-CoV-2/FLU/RSV  testing. Fact Sheet for Patients: https://www.moore.com/ Fact Sheet for Healthcare Providers: https://www.young.biz/ This test is not yet approved or cleared by the [place] FDA and  has been authorized for detection and/or diagnosis of SARS-CoV-2 by  FDA under an Emergency Use Authorization (EUA). This EUA will remain  in effect (meaning this test can be used) for the duration of the  Covid-19 declaration under Section  564(b)(1) of the Act, 21  U.S.C. section 360bbb-3(b)(1), unless the authorization is  terminated or revoked. Performed at [facility], [address] [place], [place] [address]      Labs: BNP (last 3 results) No results for input(s): BNP in the last 8760 hours. Basic Metabolic Panel: No results for input(s): NA, K, CL, CO2, GLUCOSE, BUN, CREATININE, CALCIUM, MG, PHOS in the last 168 hours. Liver Function Tests: No results for input(s): AST, ALT, ALKPHOS, BILITOT, PROT, ALBUMIN in the last 168 hours. No results for input(s): LIPASE, AMYLASE in the last 168 hours. No results for input(s): AMMONIA in the last 168 hours. CBC: No results for input(s): WBC, NEUTROABS, HGB, HCT, MCV, PLT in the last 168 hours. Cardiac Enzymes: No results for input(s): CKTOTAL, CKMB, CKMBINDEX, TROPONINI in the last 168 hours. BNP: Invalid input(s): POCBNP CBG: No results for input(s): GLUCAP in the last 168 hours. D-Dimer No results for input(s): DDIMER in the last 72 hours. Hgb A1c No results for input(s): HGBA1C in the last 72 hours. Lipid Profile No results for input(s): CHOL, HDL, LDLCALC, TRIG, CHOLHDL, LDLDIRECT in the last 72  hours. Thyroid function studies No results for input(s): TSH, T4TOTAL, T3FREE, THYROIDAB in the last 72 hours.  Invalid input(s): FREET3 Anemia work up No results for input(s): VITAMINB12, FOLATE, FERRITIN, TIBC, IRON, RETICCTPCT in the last 72 hours. Urinalysis    Component Value Date/Time   COLORURINE YELLOW 09/30/2019 1704   APPEARANCEUR CLEAR 09/30/2019 1704   LABSPEC 1.024 09/30/2019 1704   PHURINE 6.0 09/30/2019 1704   GLUCOSEU >=500 (A) 09/30/2019 1704   HGBUR NEGATIVE 09/30/2019 1704   [place] 09/30/2019 1704   KETONESUR 80 (A) 09/30/2019 1704   PROTEINUR 30 (A) 09/30/2019 1704   NITRITE NEGATIVE 09/30/2019 1704   LEUKOCYTESUR NEGATIVE 09/30/2019 1704   Sepsis Labs Invalid input(s): PROCALCITONIN,  WBC,   LACTICIDVEN Microbiology Recent Results (from the past 240 hour(s))  Respiratory Panel by RT PCR (Flu A&B, Covid) - Nasopharyngeal Swab     Status: None   Collection Time: 09/30/19  8:04 PM   Specimen: Nasopharyngeal Swab  Result Value Ref Range Status   SARS Coronavirus 2 by RT PCR NEGATIVE NEGATIVE Final    Comment: (NOTE) SARS-CoV-2 target nucleic acids are NOT DETECTED. The SARS-CoV-2 RNA is generally detectable in upper respiratoy specimens during the acute phase of infection. The lowest concentration of SARS-CoV-2 viral copies this assay can detect is 131 copies/mL. A negative result does not preclude SARS-Cov-2 infection and should not be used as the sole basis for treatment or other patient management decisions. A negative result may occur with  improper specimen collection/handling, submission of specimen other than nasopharyngeal swab, presence of viral mutation(s) within the areas targeted by this assay, and inadequate number of viral copies (<131 copies/mL). A negative result must be combined with clinical observations, patient history, and epidemiological information. The expected result is Negative. Fact Sheet for Patients:  PinkCheek.be Fact Sheet for Healthcare Providers:  GravelBags.it This test is not yet ap proved or cleared by the [place] FDA and  has been authorized for detection and/or diagnosis of SARS-CoV-2 by FDA under an Emergency Use Authorization (EUA). This EUA will remain  in effect (meaning this test can be used) for the duration of the COVID-19 declaration under Section 564(b)(1) of the Act, 21 U.S.C. section 360bbb-3(b)(1), unless the authorization is terminated or revoked sooner.    Influenza A by PCR NEGATIVE NEGATIVE Final   Influenza B by PCR NEGATIVE NEGATIVE Final    Comment: (NOTE) The Xpert Xpress SARS-CoV-2/FLU/RSV assay is intended as an aid in  the diagnosis of influenza  from Nasopharyngeal swab specimens and  should not be used as a sole basis for treatment. Nasal washings and  aspirates are unacceptable for Xpert Xpress SARS-CoV-2/FLU/RSV  testing. Fact Sheet for Patients: PinkCheek.be Fact Sheet for Healthcare Providers: GravelBags.it This test is not yet approved or cleared by the [place] FDA and  has been authorized for detection and/or diagnosis of SARS-CoV-2 by  FDA under an Emergency Use Authorization (EUA). This EUA will remain  in effect (meaning this test can be used) for the duration of the  Covid-19 declaration under Section 564(b)(1) of the Act, 21  U.S.C. section 360bbb-3(b)(1), unless the authorization is  terminated or revoked. Performed at [facility], [place] [address] [place], [place] [address]      Time coordinating discharge: 32 minutes.   SIGNED:   Hosie Poisson, MD  [facility] 10/10/2019, 8:03 AM

## 2019-10-23 ENCOUNTER — Emergency Department (HOSPITAL_COMMUNITY)
Admission: EM | Admit: 2019-10-23 | Discharge: 2019-10-23 | Disposition: A | Discharge status: Home / Self Care | Payer: BC Managed Care – PPO | Attending: Emergency Medicine | Admitting: Emergency Medicine

## 2019-10-23 ENCOUNTER — Encounter (HOSPITAL_COMMUNITY): Payer: Self-pay

## 2019-10-23 DIAGNOSIS — E1065 Type 1 diabetes mellitus with hyperglycemia: Secondary | ICD-10-CM | POA: Diagnosis not present

## 2019-10-23 DIAGNOSIS — R030 Elevated blood-pressure reading, without diagnosis of hypertension: Secondary | ICD-10-CM | POA: Diagnosis not present

## 2019-10-23 DIAGNOSIS — E860 Dehydration: Secondary | ICD-10-CM | POA: Insufficient documentation

## 2019-10-23 DIAGNOSIS — R112 Nausea with vomiting, unspecified: Secondary | ICD-10-CM

## 2019-10-23 LAB — COMPREHENSIVE METABOLIC PANEL
ALT: 63 U/L — ABNORMAL HIGH (ref 0–44)
AST: 82 U/L — ABNORMAL HIGH (ref 15–41)
Albumin: 4.8 g/dL (ref 3.5–5.0)
Alkaline Phosphatase: 76 U/L (ref 38–126)
Anion gap: 21 — ABNORMAL HIGH (ref 5–15)
BUN: 24 mg/dL — ABNORMAL HIGH (ref 6–20)
CO2: 20 mmol/L — ABNORMAL LOW (ref 22–32)
Calcium: 9.5 mg/dL (ref 8.9–10.3)
Chloride: 92 mmol/L — ABNORMAL LOW (ref 98–111)
Creatinine, Ser: 1 mg/dL (ref 0.61–1.24)
GFR calc Af Amer: >60 mL/min (ref >60)
GFR calc non Af Amer: >60 mL/min (ref >60)
Glucose, Bld: 391 mg/dL — ABNORMAL HIGH (ref 70–99)
Potassium: 5.4 mmol/L — ABNORMAL HIGH (ref 3.5–5.1)
Sodium: 133 mmol/L — ABNORMAL LOW (ref 135–145)
Total Bilirubin: 1.4 mg/dL — ABNORMAL HIGH (ref 0.3–1.2)
Total Protein: 8.5 g/dL — ABNORMAL HIGH (ref 6.5–8.1)

## 2019-10-23 LAB — BLOOD GAS, VENOUS
Acid-base deficit: 1.7 mmol/L (ref 0.0–2.0)
Bicarbonate: 23.2 mmol/L (ref 20.0–28.0)
O2 Saturation: 53.4 %
Patient temperature: 98.6
pCO2, Ven: 42.3 mmHg — ABNORMAL LOW (ref 44.0–60.0)
pH, Ven: 7.359 (ref 7.250–7.430)
pO2, Ven: 33.8 mmHg (ref 32.0–45.0)

## 2019-10-23 LAB — CBC WITH DIFFERENTIAL/PLATELET
Abs Immature Granulocytes: 0.07 10*3/uL (ref 0.00–0.07)
Basophils Absolute: 0 10*3/uL (ref 0.0–0.1)
Basophils Relative: 0 %
Eosinophils Absolute: 0 10*3/uL (ref 0.0–0.5)
Eosinophils Relative: 0 %
HCT: 44.8 % (ref 39.0–52.0)
Hemoglobin: 15.1 g/dL (ref 13.0–17.0)
Immature Granulocytes: 1 %
Lymphocytes Relative: 12 %
Lymphs Abs: 1.8 10*3/uL (ref 0.7–4.0)
MCH: 29.5 pg (ref 26.0–34.0)
MCHC: 33.7 g/dL (ref 30.0–36.0)
MCV: 87.5 fL (ref 80.0–100.0)
Monocytes Absolute: 0.3 10*3/uL (ref 0.1–1.0)
Monocytes Relative: 2 %
Neutro Abs: 12.8 10*3/uL — ABNORMAL HIGH (ref 1.7–7.7)
Neutrophils Relative %: 85 %
Platelets: 298 10*3/uL (ref 150–400)
RBC: 5.12 MIL/uL (ref 4.22–5.81)
RDW: 13 % (ref 11.5–15.5)
WBC: 15.1 10*3/uL — ABNORMAL HIGH (ref 4.0–10.5)
nRBC: 0 % (ref 0.0–0.2)

## 2019-10-23 LAB — BASIC METABOLIC PANEL
Anion gap: 13 (ref 5–15)
BUN: 21 mg/dL — ABNORMAL HIGH (ref 6–20)
CO2: 22 mmol/L (ref 22–32)
Calcium: 8.3 mg/dL — ABNORMAL LOW (ref 8.9–10.3)
Chloride: 104 mmol/L (ref 98–111)
Creatinine, Ser: 0.87 mg/dL (ref 0.61–1.24)
GFR calc Af Amer: >60 mL/min (ref >60)
GFR calc non Af Amer: >60 mL/min (ref >60)
Glucose, Bld: 170 mg/dL — ABNORMAL HIGH (ref 70–99)
Potassium: 4 mmol/L (ref 3.5–5.1)
Sodium: 139 mmol/L (ref 135–145)

## 2019-10-23 LAB — CBG MONITORING, ED: Glucose-Capillary: 366 mg/dL — ABNORMAL HIGH (ref 70–99)

## 2019-10-23 MED ORDER — DIPHENHYDRAMINE HCL 50 MG/ML IJ SOLN
12.5000 mg | Freq: Once | INTRAMUSCULAR | Status: AC
  Administered 2019-10-23: 12.5 mg via INTRAVENOUS
  Filled 2019-10-23: qty 1

## 2019-10-23 MED ORDER — INSULIN ASPART 100 UNIT/ML ~~LOC~~ SOLN
10.0000 [IU] | Freq: Once | SUBCUTANEOUS | Status: AC
  Administered 2019-10-23: 10 [IU] via INTRAVENOUS
  Filled 2019-10-23: qty 0.1

## 2019-10-23 MED ORDER — SODIUM CHLORIDE 0.9 % IV BOLUS
2000.0000 mL | Freq: Once | INTRAVENOUS | Status: AC
  Administered 2019-10-23: 2000 mL via INTRAVENOUS

## 2019-10-23 MED ORDER — SODIUM CHLORIDE 0.9 % IV BOLUS
1000.0000 mL | Freq: Once | INTRAVENOUS | Status: AC
  Administered 2019-10-23: 1000 mL via INTRAVENOUS

## 2019-10-23 MED ORDER — METOCLOPRAMIDE HCL 5 MG/ML IJ SOLN
10.0000 mg | Freq: Once | INTRAMUSCULAR | Status: AC
  Administered 2019-10-23: 10 mg via INTRAVENOUS
  Filled 2019-10-23: qty 2

## 2019-10-23 NOTE — ED Provider Notes (Signed)
[facility] DEPT Provider Note   CSN: [id number] Arrival date & time: 10/23/19  0402     History Chief Complaint  Patient presents with  . Hypertension  . Emesis    Mason Mclaughlin is a 24 y.o. male.  Patient to ED with complaint of nausea and vomiting x 1 day without abdominal pain, hematemesis or diarrhea. He is also concerned about his elevated blood pressure. No chest pain, SOB. He has a history of T1DM on insulin pump (endocrinologist: [facility] in [place]) without recent dosing changes. History of frequent presentations with DKA. He denies fever, URI symptoms.   The history is provided by the patient. No language interpreter was used.  Hypertension Pertinent negatives include no chest pain, no abdominal pain and no shortness of breath.  Emesis Associated symptoms: no abdominal pain, no chills, no cough, no diarrhea and no fever        Past Medical History:  Diagnosis Date  . Diabetes ([place])   . DKA (diabetic ketoacidoses) ([place])   . DKA, type 1 ([place])   . Insulin pump in place     Patient Active Problem List   Diagnosis Date Noted  . Hyperglycemia 07/06/2019  . Elevated blood pressure reading 07/06/2019  . ARF (acute renal failure) ([place]) 04/17/2018  . AKI (acute kidney injury) ([place])   . Nausea and vomiting   . DKA (diabetic ketoacidoses) ([place]) 05/29/2017    History reviewed. No pertinent surgical history.     Family History  Problem Relation Age of Onset  . Healthy Mother   . Diabetes Father     Social History   Tobacco Use  . Smoking status: Never Smoker  . Smokeless tobacco: Never Used  Substance Use Topics  . Alcohol use: Yes  . Drug use: Yes    Frequency: 4.0 times per week    Types: Marijuana    Comment: last use 08/30/19    Home Medications Prior to Admission medications   Medication Sig Start Date End Date Taking? Authorizing Provider  Insulin Human (INSULIN PUMP) SOLN Inject into the skin continuous. Novolog    Yes [provider]  lisinopril (ZESTRIL) 10 MG tablet Take 10 mg by mouth daily.   Yes [provider]  metoCLOPramide (REGLAN) 10 MG tablet Take 1 tablet (10 mg total) by mouth every 6 (six) hours. Patient taking differently: Take 10 mg by mouth every 6 (six) hours as needed for nausea or vomiting.  08/31/19  Yes Couture, Cortni S, PA-C  Multiple Vitamin (MULTIVITAMIN WITH MINERALS) TABS tablet Take 1 tablet by mouth daily.   Yes [provider]  potassium chloride SA (KLOR-CON) 20 MEQ tablet Take 1 tablet (20 mEq total) by mouth daily. Patient not taking: Reported on 09/30/2019 08/31/19   Petrucelli, Glynda Jaeger, PA-C    Allergies    Patient has no known allergies.  Review of Systems   Review of Systems  Constitutional: Negative for chills and fever.  Respiratory: Negative.  Negative for cough and shortness of breath.   Cardiovascular: Negative.  Negative for chest pain.  Gastrointestinal: Positive for nausea and vomiting. Negative for abdominal pain and diarrhea.  Musculoskeletal: Negative.   Skin: Negative.   Neurological: Negative.  Negative for weakness.    Physical Exam Updated Vital Signs BP (!) 164/112   Pulse (!) 113   Temp 98.4 F (36.9 C)   Resp 10   Ht 5\' 5"  (1.651 m)   Wt 59.9 kg   SpO2 95%  BMI 21.97 kg/m   Physical Exam Vitals and nursing note reviewed.  Constitutional:      Appearance: He is well-developed.  HENT:     Head: Normocephalic.  Cardiovascular:     Rate and Rhythm: Regular rhythm. Tachycardia present.     Heart sounds: No murmur.  Pulmonary:     Effort: Pulmonary effort is normal.     Breath sounds: Normal breath sounds. No wheezing, rhonchi or rales.  Abdominal:     General: Bowel sounds are normal.     Palpations: Abdomen is soft.     Tenderness: There is no abdominal tenderness. There is no guarding or rebound.  Musculoskeletal:        General: Normal range of motion.     Cervical back: Normal range of motion  and neck supple.  Skin:    General: Skin is warm and dry.  Neurological:     Mental Status: He is alert and oriented to person, place, and time.  Psychiatric:        Behavior: Behavior is withdrawn.     ED Results / Procedures / Treatments   Labs (all labs ordered are listed, but only abnormal results are displayed) Labs Reviewed  CBC WITH DIFFERENTIAL/PLATELET - Abnormal; Notable for the following components:      Result Value   WBC 15.1 (*)    Neutro Abs 12.8 (*)    All other components within normal limits  COMPREHENSIVE METABOLIC PANEL - Abnormal; Notable for the following components:   Sodium 133 (*)    Potassium 5.4 (*)    Chloride 92 (*)    CO2 20 (*)    Glucose, Bld 391 (*)    BUN 24 (*)    Total Protein 8.5 (*)    AST 82 (*)    ALT 63 (*)    Total Bilirubin 1.4 (*)    Anion gap 21 (*)    All other components within normal limits  BLOOD GAS, VENOUS - Abnormal; Notable for the following components:   pCO2, Ven 42.3 (*)    All other components within normal limits  CBG MONITORING, ED - Abnormal; Notable for the following components:   Glucose-Capillary 366 (*)    All other components within normal limits    EKG None  Radiology No results found.  Procedures Procedures (including critical care time)  Medications Ordered in ED Medications  sodium chloride 0.9 % bolus 2,000 mL (has no administration in time range)  sodium chloride 0.9 % bolus 1,000 mL (1,000 mLs Intravenous New Bag/Given 10/23/19 0556)  metoCLOPramide (REGLAN) injection 10 mg (10 mg Intravenous Given 10/23/19 0558)  diphenhydrAMINE (BENADRYL) injection 12.5 mg (12.5 mg Intravenous Given 10/23/19 0557)    ED Course  I have reviewed the triage vital signs and the nursing notes.  Pertinent labs & imaging results that were available during my care of the patient were reviewed by me and considered in my medical decision making (see chart for details).  Clinical Course as of Oct 23 2333  Tue Oct 23, 2019  1055 Repeat metabolic panel is much improved.  Gap has closed, potassium improved, glucose improved.  Patient is feeling better, tolerating p.o. fluids.  Vital signs improved as well.  Will discharge with instructions to follow-up with PCP.  Safe for discharge.   [JR]    Clinical Course User Index [JR] Robinson, [place] N, PA-C   MDM Rules/Calculators/A&P  Patient with h/o T1DM presents with ss/sxs concerning for recurrent DKA. See HPI.   The patient does not appear toxic and he denies pain. He makes very little eye contact, speaks very softly, minimal answers to questions.   Patient has a normal pH. His CO2 slightly low at 20, with gap elevated to 21. He has not been able to drink and with chloride low at 92 appears dehydrated. Aggressive fluid hydration started, 10 U IV insulin, antiemetics --> PO challenge, in attempt to correct AG and discharge home.   Blood pressure will be monitored and will need review prior to discharge.   Patient care signed out to [place] Robinson, PA-C to complete assessment and determine disposition.   Final Clinical Impression(s) / ED Diagnoses Final diagnoses:  None   1. Dehydration 2. Hyperglycemia 3. Mild DKA, normal pH  Rx / DC Orders ED Discharge Orders    None       Danne Harbor 10/24/19 2337    Shon Baton, MD 10/27/19 2300

## 2019-10-23 NOTE — ED Notes (Signed)
Pt takes bp meds

## 2019-10-23 NOTE — ED Triage Notes (Signed)
Pt reports hypertension and vomiting that he states started yesterday. Speaking softly and responding slowing in triage. Denies pain. Hx DM Type 1- states that sugars have been fluctuating lately.

## 2019-10-23 NOTE — ED Provider Notes (Signed)
Care assumed at shift change from [place], PA-C. See her note for full HPI and workup. Briefly, pt presenting with nausea and vomiting x1 day.  Patient has type 1 diabetes.  Work-up today reveals hyperglycemia with elevated gap though bicarb is near normal.  pH is also normal.  Labs indicate dehydration, suspected to be causing metabolic derangements.  Patient was aggressively hydrated with IV fluids and given antiemetics.  Plan to recheck metabolic panel after fluid administration.  Also plan to monitor tachycardia for improvement which is also suspected to be due to dehydration.  Plan for discharge gap has closed and symptoms improved.  Physical Exam  BP 104/64   Pulse 95   Temp 98.4 F (36.9 C)   Resp 17   Ht 5\' 5"  (1.651 m)   Wt 59.9 kg   SpO2 98%   BMI 21.97 kg/m   Physical Exam Vitals and nursing note reviewed.  Constitutional:      General: He is not in acute distress.    Appearance: He is well-developed.  HENT:     Head: Normocephalic and atraumatic.  Eyes:     Conjunctiva/sclera: Conjunctivae normal.  Cardiovascular:     Rate and Rhythm: Normal rate.  Pulmonary:     Effort: Pulmonary effort is normal.  Abdominal:     Palpations: Abdomen is soft.  Skin:    General: Skin is warm.  Neurological:     Mental Status: He is alert.  Psychiatric:        Behavior: Behavior normal.    Results for orders placed or performed during the hospital encounter of 10/23/19  CBC with Differential  Result Value Ref Range   WBC 15.1 (H) 4.0 - 10.5 K/uL   RBC 5.12 4.22 - 5.81 MIL/uL   Hemoglobin 15.1 13.0 - 17.0 g/dL   HCT 12/23/19 09.9 - 83.3 %   MCV 87.5 80.0 - 100.0 fL   MCH 29.5 26.0 - 34.0 pg   MCHC 33.7 30.0 - 36.0 g/dL   RDW 82.5 05.3 - 97.6 %   Platelets 298 150 - 400 K/uL   nRBC 0.0 0.0 - 0.2 %   Neutrophils Relative % 85 %   Neutro Abs 12.8 (H) 1.7 - 7.7 K/uL   Lymphocytes Relative 12 %   Lymphs Abs 1.8 0.7 - 4.0 K/uL   Monocytes Relative 2 %   Monocytes Absolute 0.3 0.1  - 1.0 K/uL   Eosinophils Relative 0 %   Eosinophils Absolute 0.0 0.0 - 0.5 K/uL   Basophils Relative 0 %   Basophils Absolute 0.0 0.0 - 0.1 K/uL   Immature Granulocytes 1 %   Abs Immature Granulocytes 0.07 0.00 - 0.07 K/uL  Comprehensive metabolic panel  Result Value Ref Range   Sodium 133 (L) 135 - 145 mmol/L   Potassium 5.4 (H) 3.5 - 5.1 mmol/L   Chloride 92 (L) 98 - 111 mmol/L   CO2 20 (L) 22 - 32 mmol/L   Glucose, Bld 391 (H) 70 - 99 mg/dL   BUN 24 (H) 6 - 20 mg/dL   Creatinine, Ser 73.4 0.61 - 1.24 mg/dL   Calcium 9.5 8.9 - 1.93 mg/dL   Total Protein 8.5 (H) 6.5 - 8.1 g/dL   Albumin 4.8 3.5 - 5.0 g/dL   AST 82 (H) 15 - 41 U/L   ALT 63 (H) 0 - 44 U/L   Alkaline Phosphatase 76 38 - 126 U/L   Total Bilirubin 1.4 (H) 0.3 - 1.2 mg/dL   GFR calc  non Af Amer >60 >60 mL/min   GFR calc Af Amer >60 >60 mL/min   Anion gap 21 (H) 5 - 15  Blood gas, venous  Result Value Ref Range   pH, Ven 7.359 7.250 - 7.430   pCO2, Ven 42.3 (L) 44.0 - 60.0 mmHg   pO2, Ven 33.8 32.0 - 45.0 mmHg   Bicarbonate 23.2 20.0 - 28.0 mmol/L   Acid-base deficit 1.7 0.0 - 2.0 mmol/L   O2 Saturation 53.4 %   Patient temperature 98.6    Sample type VENOUS   Basic metabolic panel  Result Value Ref Range   Sodium 139 135 - 145 mmol/L   Potassium 4.0 3.5 - 5.1 mmol/L   Chloride 104 98 - 111 mmol/L   CO2 22 22 - 32 mmol/L   Glucose, Bld 170 (H) 70 - 99 mg/dL   BUN 21 (H) 6 - 20 mg/dL   Creatinine, Ser 0.87 0.61 - 1.24 mg/dL   Calcium 8.3 (L) 8.9 - 10.3 mg/dL   GFR calc non Af Amer >60 >60 mL/min   GFR calc Af Amer >60 >60 mL/min   Anion gap 13 5 - 15  CBG monitoring, ED  Result Value Ref Range   Glucose-Capillary 366 (H) 70 - 99 mg/dL   No results found.  ED Course/Procedures   Clinical Course as of Oct 23 1055  Tue Oct 23, 2019  5809 Repeat metabolic panel is much improved.  Gap has closed, potassium improved, glucose improved.  Patient is feeling better, tolerating p.o. fluids.  Vital signs  improved as well.  Will discharge with instructions to follow-up with PCP.  Safe for discharge.   [JR]    Clinical Course User Index [JR] Willis Kuipers, [place] N, PA-C    Procedures  MDM  Patient symptoms have improved after IV fluids and tolerating p.o.  Gap has closed, metabolic panel seems much improved. Tachycardia resolved. He feels comfortable with discharge with symptomatic management.  He reports he has antiemetics at home and does not need a Rx.  Discussed PCP follow-up and return precautions.  Safe for discharge.      Bubba Vanbenschoten, [place] N, PA-C 10/23/19 1101    Merryl Hacker, MD 10/27/19 2300

## 2019-10-23 NOTE — Discharge Instructions (Addendum)
Please read instructions below. Drink clear liquids until your stomach feels better. Then, slowly introduce bland foods into your diet as tolerated, such as bread, rice, apples, bananas. You can take your prescribed medication as directed for nausea. Monitor your blood sugar. Follow up with your primary care. Return to the ER for severe abdominal pain, fever, uncontrollable vomiting, high blood sugar, or new or concerning symptoms.

## 2019-11-23 ENCOUNTER — Encounter (HOSPITAL_COMMUNITY): Payer: Self-pay | Admitting: Emergency Medicine

## 2019-11-23 ENCOUNTER — Inpatient Hospital Stay (HOSPITAL_COMMUNITY)
Admission: EM | Admit: 2019-11-23 | Discharge: 2019-11-24 | DRG: 638 | Disposition: A | Discharge status: Home / Self Care | Payer: BC Managed Care – PPO | Attending: Family Medicine | Admitting: Family Medicine

## 2019-11-23 ENCOUNTER — Other Ambulatory Visit: Payer: Self-pay

## 2019-11-23 DIAGNOSIS — D72829 Elevated white blood cell count, unspecified: Secondary | ICD-10-CM | POA: Diagnosis present

## 2019-11-23 DIAGNOSIS — E1010 Type 1 diabetes mellitus with ketoacidosis without coma: Secondary | ICD-10-CM | POA: Diagnosis present

## 2019-11-23 DIAGNOSIS — N179 Acute kidney failure, unspecified: Secondary | ICD-10-CM | POA: Diagnosis present

## 2019-11-23 DIAGNOSIS — Z9119 Patient's noncompliance with other medical treatment and regimen: Secondary | ICD-10-CM

## 2019-11-23 DIAGNOSIS — I152 Hypertension secondary to endocrine disorders: Secondary | ICD-10-CM | POA: Diagnosis present

## 2019-11-23 DIAGNOSIS — E1110 Type 2 diabetes mellitus with ketoacidosis without coma: Secondary | ICD-10-CM | POA: Diagnosis present

## 2019-11-23 DIAGNOSIS — E1159 Type 2 diabetes mellitus with other circulatory complications: Secondary | ICD-10-CM | POA: Diagnosis not present

## 2019-11-23 DIAGNOSIS — I10 Essential (primary) hypertension: Secondary | ICD-10-CM | POA: Diagnosis present

## 2019-11-23 DIAGNOSIS — Z20822 Contact with and (suspected) exposure to covid-19: Secondary | ICD-10-CM | POA: Diagnosis present

## 2019-11-23 DIAGNOSIS — E875 Hyperkalemia: Secondary | ICD-10-CM | POA: Diagnosis present

## 2019-11-23 DIAGNOSIS — Z9641 Presence of insulin pump (external) (internal): Secondary | ICD-10-CM | POA: Diagnosis present

## 2019-11-23 DIAGNOSIS — Z833 Family history of diabetes mellitus: Secondary | ICD-10-CM

## 2019-11-23 LAB — BASIC METABOLIC PANEL
Anion gap: 26 — ABNORMAL HIGH (ref 5–15)
Anion gap: 15 (ref 5–15)
Anion gap: 18 — ABNORMAL HIGH (ref 5–15)
Anion gap: 13 (ref 5–15)
Anion gap: 16 — ABNORMAL HIGH (ref 5–15)
BUN: 21 mg/dL — ABNORMAL HIGH (ref 6–20)
BUN: 14 mg/dL (ref 6–20)
BUN: 12 mg/dL (ref 6–20)
BUN: 12 mg/dL (ref 6–20)
BUN: 10 mg/dL (ref 6–20)
CO2: 9 mmol/L — ABNORMAL LOW (ref 22–32)
CO2: 15 mmol/L — ABNORMAL LOW (ref 22–32)
CO2: 16 mmol/L — ABNORMAL LOW (ref 22–32)
CO2: 18 mmol/L — ABNORMAL LOW (ref 22–32)
CO2: 20 mmol/L — ABNORMAL LOW (ref 22–32)
Calcium: 9.2 mg/dL (ref 8.9–10.3)
Calcium: 8.8 mg/dL — ABNORMAL LOW (ref 8.9–10.3)
Calcium: 8.9 mg/dL (ref 8.9–10.3)
Calcium: 8.9 mg/dL (ref 8.9–10.3)
Calcium: 8.9 mg/dL (ref 8.9–10.3)
Chloride: 95 mmol/L — ABNORMAL LOW (ref 98–111)
Chloride: 108 mmol/L (ref 98–111)
Chloride: 102 mmol/L (ref 98–111)
Chloride: 105 mmol/L (ref 98–111)
Chloride: 101 mmol/L (ref 98–111)
Creatinine, Ser: 1.58 mg/dL — ABNORMAL HIGH (ref 0.61–1.24)
Creatinine, Ser: 1.14 mg/dL (ref 0.61–1.24)
Creatinine, Ser: 0.86 mg/dL (ref 0.61–1.24)
Creatinine, Ser: 0.83 mg/dL (ref 0.61–1.24)
Creatinine, Ser: 0.89 mg/dL (ref 0.61–1.24)
GFR calc Af Amer: >60 mL/min (ref >60)
GFR calc Af Amer: >60 mL/min (ref >60)
GFR calc Af Amer: >60 mL/min (ref >60)
GFR calc Af Amer: >60 mL/min (ref >60)
GFR calc Af Amer: >60 mL/min (ref >60)
GFR calc non Af Amer: >60 mL/min (ref >60)
GFR calc non Af Amer: >60 mL/min (ref >60)
GFR calc non Af Amer: >60 mL/min (ref >60)
GFR calc non Af Amer: >60 mL/min (ref >60)
GFR calc non Af Amer: >60 mL/min (ref >60)
Glucose, Bld: 529 mg/dL (ref 70–99)
Glucose, Bld: 166 mg/dL — ABNORMAL HIGH (ref 70–99)
Glucose, Bld: 178 mg/dL — ABNORMAL HIGH (ref 70–99)
Glucose, Bld: 180 mg/dL — ABNORMAL HIGH (ref 70–99)
Glucose, Bld: 133 mg/dL — ABNORMAL HIGH (ref 70–99)
Potassium: 6.9 mmol/L (ref 3.5–5.1)
Potassium: 4.3 mmol/L (ref 3.5–5.1)
Potassium: 3.9 mmol/L (ref 3.5–5.1)
Potassium: 3.8 mmol/L (ref 3.5–5.1)
Potassium: 3.6 mmol/L (ref 3.5–5.1)
Sodium: 130 mmol/L — ABNORMAL LOW (ref 135–145)
Sodium: 138 mmol/L (ref 135–145)
Sodium: 136 mmol/L (ref 135–145)
Sodium: 136 mmol/L (ref 135–145)
Sodium: 137 mmol/L (ref 135–145)

## 2019-11-23 LAB — GLUCOSE, CAPILLARY
Glucose-Capillary: 139 mg/dL — ABNORMAL HIGH (ref 70–99)
Glucose-Capillary: 154 mg/dL — ABNORMAL HIGH (ref 70–99)
Glucose-Capillary: 156 mg/dL — ABNORMAL HIGH (ref 70–99)
Glucose-Capillary: 159 mg/dL — ABNORMAL HIGH (ref 70–99)
Glucose-Capillary: 159 mg/dL — ABNORMAL HIGH (ref 70–99)
Glucose-Capillary: 163 mg/dL — ABNORMAL HIGH (ref 70–99)
Glucose-Capillary: 165 mg/dL — ABNORMAL HIGH (ref 70–99)
Glucose-Capillary: 166 mg/dL — ABNORMAL HIGH (ref 70–99)
Glucose-Capillary: 171 mg/dL — ABNORMAL HIGH (ref 70–99)
Glucose-Capillary: 175 mg/dL — ABNORMAL HIGH (ref 70–99)
Glucose-Capillary: 177 mg/dL — ABNORMAL HIGH (ref 70–99)
Glucose-Capillary: 180 mg/dL — ABNORMAL HIGH (ref 70–99)
Glucose-Capillary: 184 mg/dL — ABNORMAL HIGH (ref 70–99)
Glucose-Capillary: 197 mg/dL — ABNORMAL HIGH (ref 70–99)
Glucose-Capillary: 204 mg/dL — ABNORMAL HIGH (ref 70–99)
Glucose-Capillary: 208 mg/dL — ABNORMAL HIGH (ref 70–99)
Glucose-Capillary: 215 mg/dL — ABNORMAL HIGH (ref 70–99)
Glucose-Capillary: 237 mg/dL — ABNORMAL HIGH (ref 70–99)

## 2019-11-23 LAB — URINALYSIS, ROUTINE W REFLEX MICROSCOPIC
Bacteria, UA: NONE SEEN
Bilirubin Urine: NEGATIVE
Glucose, UA: >=500 mg/dL — AB
Ketones, ur: 80 mg/dL — AB
Leukocytes,Ua: NEGATIVE
Nitrite: NEGATIVE
Protein, ur: 30 mg/dL — AB
Specific Gravity, Urine: 1.024 (ref 1.005–1.030)
pH: 5 (ref 5.0–8.0)

## 2019-11-23 LAB — BETA-HYDROXYBUTYRIC ACID
Beta-Hydroxybutyric Acid: 4.02 mmol/L — ABNORMAL HIGH (ref 0.05–0.27)
Beta-Hydroxybutyric Acid: 4 mmol/L — ABNORMAL HIGH (ref 0.05–0.27)
Beta-Hydroxybutyric Acid: 2.36 mmol/L — ABNORMAL HIGH (ref 0.05–0.27)

## 2019-11-23 LAB — BLOOD GAS, VENOUS
Acid-base deficit: 19 mmol/L — ABNORMAL HIGH (ref 0.0–2.0)
Bicarbonate: 10.1 mmol/L — ABNORMAL LOW (ref 20.0–28.0)
O2 Saturation: 88.9 %
Patient temperature: 98.6
pCO2, Ven: 32.5 mmHg — ABNORMAL LOW (ref 44.0–60.0)
pH, Ven: 7.119 — CL (ref 7.250–7.430)
pO2, Ven: 69.7 mmHg — ABNORMAL HIGH (ref 32.0–45.0)

## 2019-11-23 LAB — SARS CORONAVIRUS 2 BY RT PCR (HOSPITAL ORDER, PERFORMED IN ~~LOC~~ HOSPITAL LAB): SARS Coronavirus 2: NEGATIVE

## 2019-11-23 LAB — CBC
HCT: 49.3 % (ref 39.0–52.0)
Hemoglobin: 16.7 g/dL (ref 13.0–17.0)
MCH: 29.6 pg (ref 26.0–34.0)
MCHC: 33.9 g/dL (ref 30.0–36.0)
MCV: 87.4 fL (ref 80.0–100.0)
Platelets: 418 10*3/uL — ABNORMAL HIGH (ref 150–400)
RBC: 5.64 MIL/uL (ref 4.22–5.81)
RDW: 13 % (ref 11.5–15.5)
WBC: 26.7 10*3/uL — ABNORMAL HIGH (ref 4.0–10.5)
nRBC: 0 % (ref 0.0–0.2)

## 2019-11-23 LAB — CBG MONITORING, ED
Glucose-Capillary: 529 mg/dL (ref 70–99)
Glucose-Capillary: 493 mg/dL — ABNORMAL HIGH (ref 70–99)
Glucose-Capillary: 465 mg/dL — ABNORMAL HIGH (ref 70–99)
Glucose-Capillary: 319 mg/dL — ABNORMAL HIGH (ref 70–99)

## 2019-11-23 LAB — MRSA PCR SCREENING: MRSA by PCR: NEGATIVE

## 2019-11-23 LAB — SODIUM, URINE, RANDOM: Sodium, Ur: 57 mmol/L

## 2019-11-23 LAB — LIPASE, BLOOD: Lipase: 48 U/L (ref 11–51)

## 2019-11-23 LAB — CREATININE, URINE, RANDOM: Creatinine, Urine: 48.76 mg/dL

## 2019-11-23 MED ORDER — LABETALOL HCL 5 MG/ML IV SOLN
10.0000 mg | INTRAVENOUS | Status: DC | PRN
  Administered 2019-11-23 – 2019-11-24 (×2): 10 mg via INTRAVENOUS
  Filled 2019-11-23 (×2): qty 4

## 2019-11-23 MED ORDER — SODIUM CHLORIDE 0.9 % IV SOLN: INTRAVENOUS | Status: DC

## 2019-11-23 MED ORDER — INSULIN ASPART 100 UNIT/ML ~~LOC~~ SOLN
10.0000 [IU] | Freq: Once | SUBCUTANEOUS | Status: AC
  Administered 2019-11-23: 10 [IU] via SUBCUTANEOUS
  Filled 2019-11-23: qty 0.1

## 2019-11-23 MED ORDER — SODIUM ZIRCONIUM CYCLOSILICATE 5 G PO PACK
5.0000 g | PACK | Freq: Once | ORAL | Status: DC
  Filled 2019-11-23: qty 1

## 2019-11-23 MED ORDER — CHLORHEXIDINE GLUCONATE CLOTH 2 % EX PADS
6.0000 | MEDICATED_PAD | Freq: Every day | CUTANEOUS | Status: DC
  Administered 2019-11-24: 6 via TOPICAL

## 2019-11-23 MED ORDER — MELATONIN 5 MG PO TABS
5.0000 mg | ORAL_TABLET | Freq: Every evening | ORAL | Status: DC | PRN
  Administered 2019-11-23: 5 mg via ORAL
  Filled 2019-11-23: qty 1

## 2019-11-23 MED ORDER — INSULIN REGULAR(HUMAN) IN NACL 100-0.9 UT/100ML-% IV SOLN
INTRAVENOUS | Status: AC
  Administered 2019-11-23: 6 [IU]/h via INTRAVENOUS
  Administered 2019-11-24: 1.5 [IU]/h via INTRAVENOUS
  Filled 2019-11-23: qty 100

## 2019-11-23 MED ORDER — SODIUM CHLORIDE 0.9 % IV BOLUS
1000.0000 mL | INTRAVENOUS | Status: AC
  Administered 2019-11-23 (×2): 1000 mL via INTRAVENOUS

## 2019-11-23 MED ORDER — DEXTROSE 50 % IV SOLN: 0.0000 mL | INTRAVENOUS | Status: DC | PRN

## 2019-11-23 MED ORDER — SODIUM CHLORIDE 0.9 % IV BOLUS (SEPSIS)
1000.0000 mL | Freq: Once | INTRAVENOUS | Status: AC
  Administered 2019-11-23: 1000 mL via INTRAVENOUS

## 2019-11-23 MED ORDER — LISINOPRIL 10 MG PO TABS
10.0000 mg | ORAL_TABLET | Freq: Every day | ORAL | Status: DC
  Administered 2019-11-23 – 2019-11-24 (×2): 10 mg via ORAL
  Filled 2019-11-23 (×2): qty 1

## 2019-11-23 MED ORDER — INSULIN REGULAR(HUMAN) IN NACL 100-0.9 UT/100ML-% IV SOLN
INTRAVENOUS | Status: DC
  Administered 2019-11-23: 8.5 [IU]/h via INTRAVENOUS
  Filled 2019-11-23: qty 100

## 2019-11-23 MED ORDER — ONDANSETRON HCL 4 MG/2ML IJ SOLN
4.0000 mg | Freq: Four times a day (QID) | INTRAMUSCULAR | Status: DC | PRN
  Administered 2019-11-23: 4 mg via INTRAVENOUS
  Filled 2019-11-23 (×2): qty 2

## 2019-11-23 MED ORDER — ENOXAPARIN SODIUM 40 MG/0.4ML ~~LOC~~ SOLN
40.0000 mg | SUBCUTANEOUS | Status: DC
  Administered 2019-11-23 – 2019-11-24 (×2): 40 mg via SUBCUTANEOUS
  Filled 2019-11-23 (×2): qty 0.4

## 2019-11-23 MED ORDER — ORAL CARE MOUTH RINSE
15.0000 mL | Freq: Two times a day (BID) | OROMUCOSAL | Status: DC
  Administered 2019-11-23: 15 mL via OROMUCOSAL

## 2019-11-23 MED ORDER — DEXTROSE-NACL 5-0.45 % IV SOLN: INTRAVENOUS | Status: DC

## 2019-11-23 NOTE — Plan of Care (Signed)
Pt admitted for DKA. Currently on insulin gtt and IVF

## 2019-11-23 NOTE — ED Provider Notes (Signed)
[facility]-EMERGENCY DEPT Provider Note   CSN: [id number] Arrival date & time: 11/23/19  0129     History Chief Complaint  Patient presents with   Hyperglycemia   Emesis    Mason Mclaughlin is a 23 y.o. male.  Patient is a 24 y/o type one diabetic on insulin pump presenting to the ER for n/v and feeling unwell for the last several hours. Reports that his sugars have been ranging from a of 70 to highs in thew 400s over the last few days. History of frequent presentations with DKA.         Past Medical History:  Diagnosis Date   Diabetes ([facility])    DKA (diabetic ketoacidoses) ([facility])    DKA, type 1 ([facility])    Insulin pump in place     Patient Active Problem List   Diagnosis Date Noted   Hyperkalemia 11/23/2019   Leukocytosis 11/23/2019   Hypertension complicating diabetes ([facility]) 11/23/2019   Type 1 diabetes mellitus with ketoacidosis without coma ([facility])    Hyperglycemia 07/06/2019   Elevated blood pressure reading 07/06/2019   ARF (acute renal failure) ([facility]) 04/17/2018   AKI (acute kidney injury) ([facility])    Nausea and vomiting    DKA (diabetic ketoacidoses) (H[facility]) 05/29/2017    History reviewed. No pertinent surgical history.     Family History  Problem Relation Age of Onset   Healthy Mother    Diabetes Father     Social History   Tobacco Use   Smoking status: Never Smoker   Smokeless tobacco: Never Used  Vaping Use   Vaping Use: Never used  Substance Use Topics   Alcohol use: Yes   Drug use: Yes    Frequency: 4.0 times per week    Types: Marijuana    Comment: last use 08/30/19    Home Medications Prior to Admission medications   Medication Sig Start Date End Date Taking? Authorizing Provider  insulin aspart (NOVOLOG) 100 UNIT/ML injection Inject 60 Units into the skin daily. Max dose 60 units 11/13/19  Yes [provider]  lisinopril (ZESTRIL) 10 MG tablet Take 10 mg by mouth daily.   Yes [provider]  Multiple Vitamin (MULTIVITAMIN WITH MINERALS) TABS tablet Take 1 tablet by mouth daily.   Yes [provider]  metoCLOPramide (REGLAN) 10 MG tablet Take 1 tablet (10 mg total) by mouth every 6 (six) hours. Patient taking differently: Take 10 mg by mouth every 6 (six) hours as needed for nausea or vomiting.  08/31/19   Couture, Cortni S, PA-C  potassium chloride SA (KLOR-CON) 20 MEQ tablet Take 1 tablet (20 mEq total) by mouth daily. Patient not taking: Reported on 09/30/2019 08/31/19   Petrucelli, Pleas Koch, PA-C    Allergies    Patient has no known allergies.  Review of Systems   Review of Systems  Constitutional: Negative for appetite change, chills and fever.  HENT: Negative.   Respiratory: Negative for cough and shortness of breath.   Cardiovascular: Negative for chest pain.  Gastrointestinal: Positive for nausea and vomiting. Negative for abdominal pain.  Endocrine: Positive for polyuria.  Genitourinary: Negative for dysuria.  Musculoskeletal: Negative for arthralgias and back pain.  Skin: Negative for rash and wound.  Neurological: Negative for dizziness.    Physical Exam Updated Vital Signs BP (!) 145/106    Pulse (!) 116    Temp 97.8 F (36.6 C) (Oral)    Resp (!) 31    Ht 5\' 3"  (1.6 m)  Wt 59 kg    SpO2 100%    BMI 23.03 kg/m   Physical Exam Vitals and nursing note reviewed.  Constitutional:      General: He is not in acute distress.    Appearance: Normal appearance. He is not ill-appearing, toxic-appearing or diaphoretic.     Comments: Thin, uncomfortable appearing  HENT:     Head: Normocephalic.     Mouth/Throat:     Mouth: Mucous membranes are moist.  Eyes:     Conjunctiva/sclera: Conjunctivae normal.  Cardiovascular:     Rate and Rhythm: Tachycardia present.  Pulmonary:     Effort: Pulmonary effort is normal.     Breath sounds: Normal breath sounds.  Abdominal:     General: Abdomen is flat.  Skin:    General: Skin is warm and dry.    Neurological:     Mental Status: He is alert.  Psychiatric:        Mood and Affect: Mood normal.     ED Results / Procedures / Treatments   Labs (all labs ordered are listed, but only abnormal results are displayed) Labs Reviewed  CBC - Abnormal; Notable for the following components:      Result Value   WBC 26.7 (*)    Platelets 418 (*)    All other components within normal limits  URINALYSIS, ROUTINE W REFLEX MICROSCOPIC - Abnormal; Notable for the following components:   Color, Urine STRAW (*)    Glucose, UA >=500 (*)    Hgb urine dipstick SMALL (*)    Ketones, ur 80 (*)    Protein, ur 30 (*)    All other components within normal limits  BLOOD GAS, VENOUS - Abnormal; Notable for the following components:   pH, Ven 7.119 (*)    pCO2, Ven 32.5 (*)    pO2, Ven 69.7 (*)    Bicarbonate 10.1 (*)    Acid-base deficit 19.0 (*)    All other components within normal limits  BASIC METABOLIC PANEL - Abnormal; Notable for the following components:   Sodium 130 (*)    Potassium 6.9 (*)    Chloride 95 (*)    CO2 9 (*)    Glucose, Bld 529 (*)    BUN 21 (*)    Creatinine, Ser 1.58 (*)    Anion gap 26 (*)    All other components within normal limits  GLUCOSE, CAPILLARY - Abnormal; Notable for the following components:   Glucose-Capillary 237 (*)    All other components within normal limits  CBG MONITORING, ED - Abnormal; Notable for the following components:   Glucose-Capillary 529 (*)    All other components within normal limits  CBG MONITORING, ED - Abnormal; Notable for the following components:   Glucose-Capillary 493 (*)    All other components within normal limits  CBG MONITORING, ED - Abnormal; Notable for the following components:   Glucose-Capillary 465 (*)    All other components within normal limits  CBG MONITORING, ED - Abnormal; Notable for the following components:   Glucose-Capillary 319 (*)    All other components within normal limits  SARS CORONAVIRUS 2 BY RT  PCR (HOSPITAL ORDER, PERFORMED IN [facility])  MRSA PCR SCREENING  LIPASE, BLOOD  BASIC METABOLIC PANEL  BASIC METABOLIC PANEL  BASIC METABOLIC PANEL  BASIC METABOLIC PANEL  BASIC METABOLIC PANEL  SODIUM, URINE, RANDOM  CREATININE, URINE, RANDOM  BETA-HYDROXYBUTYRIC ACID  BETA-HYDROXYBUTYRIC ACID  BETA-HYDROXYBUTYRIC ACID    EKG None  Radiology No results found.  Procedures Procedures (including critical care time)  Medications Ordered in ED Medications  sodium zirconium cyclosilicate (LOKELMA) packet 5 g (has no administration in time range)  enoxaparin (LOVENOX) injection 40 mg (has no administration in time range)  insulin regular, human (MYXREDLIN) 100 units/ 100 mL infusion ( Intravenous Rate/Dose Verify 11/23/19 0612)  0.9 %  sodium chloride infusion ( Intravenous Stopped 11/23/19 0611)  dextrose 5 %-0.45 % sodium chloride infusion ( Intravenous New Bag/Given 11/23/19 0612)  dextrose 50 % solution 0-50 mL (has no administration in time range)  labetalol (NORMODYNE) injection 10 mg (10 mg Intravenous Given 11/23/19 0606)  Chlorhexidine Gluconate Cloth 2 % PADS 6 each (has no administration in time range)  MEDLINE mouth rinse (has no administration in time range)  ondansetron (ZOFRAN) injection 4 mg (has no administration in time range)  sodium chloride 0.9 % bolus 1,000 mL (0 mLs Intravenous Stopped 11/23/19 0316)  insulin aspart (novoLOG) injection 10 Units (10 Units Subcutaneous Given 11/23/19 0217)  sodium chloride 0.9 % bolus 1,000 mL (1,000 mLs Intravenous New Bag/Given 11/23/19 0438)    ED Course  I have reviewed the triage vital signs and the nursing notes.  Pertinent labs & imaging results that were available during my care of the patient were reviewed by me and considered in my medical decision making (see chart for details).  Clinical Course as of Nov 23 650  Fri Nov 23, 2019  0207 Type I diabetic presenting for nausea vomiting for the last few hours.   Glucose of 529 on arrival.  History of DKA.  Fluids and insulin initiated.  Labs are pending.   [KM]  0408 Potassium(!!): 6.9 [KM]  0433 Patient is responding to fluids and insulin. He does have K of 6.9, this was verified with the lab. Has peaked T waves in one lead. Also has AKI. Will continue with fluids and insulin. Dr. Antionette Char to admit to hospital for further treatment of DKA. Patient resting and stable currently.   [KM]    Clinical Course User Index [KM] Jeral Pinch   MDM Rules/Calculators/A&P                          CRITICAL CARE Performed by: Arlyn Dunning   Total critical care time: 35 minutes  Critical care time was exclusive of separately billable procedures and treating other patients.  Critical care was necessary to treat or prevent imminent or life-threatening deterioration.  Critical care was time spent personally by me on the following activities: development of treatment plan with patient and/or surrogate as well as nursing, discussions with consultants, evaluation of patient's response to treatment, examination of patient, obtaining history from patient or surrogate, ordering and performing treatments and interventions, ordering and review of laboratory studies, ordering and review of radiographic studies, pulse oximetry and re-evaluation of patient's condition.  Final Clinical Impression(s) / ED Diagnoses Final diagnoses:  Type 1 diabetes mellitus with ketoacidosis without coma [facility])    Rx / DC Orders ED Discharge Orders    None       Jeral Pinch 11/23/19 3151    Nira Conn, MD 11/23/19 [phone]

## 2019-11-23 NOTE — Progress Notes (Signed)
Inpatient Diabetes Program Recommendations  AACE/ADA: New Consensus Statement on Inpatient Glycemic Control (2015)  Target Ranges:  Prepandial:   less than 140 mg/dL      Peak postprandial:   less than 180 mg/dL (1-2 hours)      Critically ill patients:  140 - 180 mg/dL   Lab Results  Component Value Date   GLUCAP 163 (H) 11/23/2019   HGBA1C 10.2 (H) 10/01/2019    Review of Glycemic Control  Diabetes history: DM1 Outpatient Diabetes medications: Insulin pump - T-slim. If pump failure, Basaglar 32 units QHS Current orders for Inpatient glycemic control: IV insulin  Endo - [facility] - 10.2% CO2 - 16, AG - 18 Glucose 166 Has Dexcom G6 Sensor and T-slim pump  Time Basal ICR ISF IOB Target  00:00 1.3 7._0 100-110  06:30 1.5 7._1 100-110  12:00 1.4 7._2 100-110  23:00 1.15 7._3 100-110   Pt has had no training on his insulin pump per [facility].       Inpatient Diabetes Program Recommendations:     When criteria met for transition, give Lantus 2 hours prior to discontinuation of drip.  Lantus 32 units Q24H (per [facility] if pt not on pump) Novolog 0-9 units Q4H x 12H, then tidwc and hs Novolog 10 units tidwc (per [facility] if not on pump) for meal coverage insulin.  Spoke with pt at bedside regarding his glycemic control and HgbA1C of 10.2%, which is down from 10.8% on 08/22/19.Pt states he thinks the tubing kinked on insulin pump and this was likely reason he ended up in DKA. States he had mini med pump for over 10 years, and changed to Tandum. Has not been using Dexcom sensor which is at his mother's house. States he will pick up when leaving hospital. Long discussion about importance of regular f/u with [facility]. States he has appt in Aug 2021. Has very few hypos, and treats with juice. No problems in getting supplies or insulin. Needs to f/u with CDE at [facility] for additional education on Tandum pump. Pt said his diet is pretty good, drinks diet  sodas and Gatorade zero. Occasionally forgets to bolus for snacks.  Will need to wait 24H after Lantus is given to restart basal rate on pump. Can restart pump using only bolus insulin for meals and correction. Stressed importance of f/u to [facility]. Answered questions.   Thank you. Lorenda Peck, RD, LDN, CDE Inpatient Diabetes Coordinator [phone]

## 2019-11-23 NOTE — ED Triage Notes (Signed)
Patient is complaining of throwing up and having a high blood sugar. Patient states this started tonight.

## 2019-11-23 NOTE — ED Provider Notes (Signed)
Attestation: Medical screening examination/treatment/procedure(s) were conducted as a shared visit with non-physician practitioner(s) and myself.  I personally evaluated the patient during the encounter.   Briefly, the patient is a 24 y.o. male with h/o DM, here for hyperglycemia and emesis.   Vitals:   11/23/19 0600 11/23/19 0630  BP: (!) 174/105 (!) 145/106  Pulse: (!) 113 (!) 116  Resp: 13 (!) 31  Temp:    SpO2: 99% 100%    CONSTITUTIONAL:  ill-appearing, NAD NEURO:  Alert and oriented x 3, no focal deficits EYES:  pupils equal and reactive ENT/NECK:  trachea midline, no JVD CARDIO:  tachy rate, reg rhythm, well-perfused PULM:   None labored breathing GI/GU:  Abdomin non-distended MSK/SPINE:  No gross deformities, no edema SKIN:  no rash, atraumatic PSYCH:  Appropriate speech and behavior   EKG Interpretation  Date/Time:    Ventricular Rate:    PR Interval:    QRS Duration:   QT Interval:    QTC Calculation:   R Axis:     Text Interpretation:         Work up consistent with DKA. Insulin gtt and IVF started. Also has hyperK that can be treated with insulin and IVF. No source of infection. Admitted to medicine for further management.   .Critical Care Performed by: Nira Conn, MD Authorized by: Nira Conn, MD    CRITICAL CARE Performed by: Amadeo Garnet Albena Comes Total critical care time: 15 minutes Critical care time was exclusive of separately billable procedures and treating other patients. Critical care was necessary to treat or prevent imminent or life-threatening deterioration. Critical care was time spent personally by me on the following activities: development of treatment plan with patient and/or surrogate as well as nursing, discussions with consultants, evaluation of patient's response to treatment, examination of patient, obtaining history from patient or surrogate, ordering and performing treatments and interventions, ordering  and review of laboratory studies, ordering and review of radiographic studies, pulse oximetry and re-evaluation of patient's condition.       Nira Conn, MD 11/23/19 [phone]

## 2019-11-23 NOTE — TOC Initial Note (Signed)
Transition of Care [facility]) - Initial/Assessment Note    Patient Details  Name: Mason Mclaughlin MRN: [id number] Date of Birth: 1995/08/25  [facility]) CM/SW Contact:    Golda Acre, RN Phone Number: 11/23/2019, 8:00 AM  Clinical Narrative:                 dka- anion gap closed at 15, iv insulin, bld glucose this am 116, K+ 6.9 on admit now is 4.3 Plan: diabetic coordinator to see/will follow for toc needs  Expected Discharge Plan: Home/Self Care Barriers to Discharge: Continued Medical Work up   Patient Goals and CMS Choice Patient states their goals for this hospitalization and ongoing recovery are:: to return to my home and be under control CMS Medicare.gov Compare Post Acute Care list provided to:: Patient    Expected Discharge Plan and Services Expected Discharge Plan: Home/Self Care   Discharge Planning Services: CM Consult   Living arrangements for the past 2 months: Apartment                                      Prior Living Arrangements/Services Living arrangements for the past 2 months: Apartment Lives with:: Self Patient language and need for interpreter reviewed:: Yes Do you feel safe going back to the place where you live?: Yes      Need for Family Participation in Patient Care: Yes (Comment) Care giver support system in place?: Yes (comment)   Criminal Activity/Legal Involvement Pertinent to Current Situation/Hospitalization: No - Comment as needed  Activities of Daily Living Home Assistive Devices/Equipment: None ADL Screening (condition at time of admission) Patient's cognitive ability adequate to safely complete daily activities?: Yes Is the patient deaf or have difficulty hearing?: No Does the patient have difficulty seeing, even when wearing glasses/contacts?: No Does the patient have difficulty concentrating, remembering, or making decisions?: No Patient able to express need for assistance with ADLs?: No Does the patient  have difficulty dressing or bathing?: No Independently performs ADLs?: Yes (appropriate for developmental age) Does the patient have difficulty walking or climbing stairs?: No Weakness of Legs: None Weakness of Arms/Hands: None  Permission Sought/Granted                  Emotional Assessment Appearance:: Appears stated age     Orientation: : Oriented to Self, Oriented to Place, Oriented to  Time, Oriented to Situation Alcohol / Substance Use: Not Applicable Psych Involvement: No (comment)  Admission diagnosis:  DKA (diabetic ketoacidoses) (HCC) [E11.10] Type 1 diabetes mellitus with ketoacidosis without coma (HCC) [E10.10] Patient Active Problem List   Diagnosis Date Noted  . Hyperkalemia 11/23/2019  . Leukocytosis 11/23/2019  . Hypertension complicating diabetes (HCC) 11/23/2019  . Type 1 diabetes mellitus with ketoacidosis without coma (HCC)   . Hyperglycemia 07/06/2019  . Elevated blood pressure reading 07/06/2019  . ARF (acute renal failure) (HCC) 04/17/2018  . AKI (acute kidney injury) (HCC)   . Nausea and vomiting   . DKA (diabetic ketoacidoses) (HCC) 05/29/2017   PCP:  Harvel Quale, MD Pharmacy:   [facility] [address] [place] - [address] [place] [address] Phone: [phone] Fax: [phone]     Social Determinants of Health (SDOH) Interventions    Readmission Risk Interventions No flowsheet data found.

## 2019-11-23 NOTE — Progress Notes (Addendum)
Patient is a 24 year old male with history of type 1 diabetes mellitus, hypertension, noncompliance who presented to the emergency room with complaints of nausea, vomiting.  He follows with at [facility] for the management of his diabetes.  His recent hemoglobin A1c is 10.2 as per May this year. he is on insulin pump at home.  On presentation, he was tachycardic, hypertensive.  Potassium was 6.9, anion gap was 26.  Creatinine of 1.58.  Urinalysis showed ketonuria.  Patient was admitted for the management of diabetes ketoacidosis.  Started on insulin drip, IV fluids. This morning, he was still tachycardic.  Gap has narrowed to 15. We will continue insulin drip for now.  We will check another BMP this afternoon and after that we can transition his insulin to long-acting and sliding scale.  Diabetic coordinator has been consulted and we will follow up the recommendation.  Patient says he is compliant with insulin pump at home. We will continue to monitor his blood pressure.  Hyperkalemia has resolved.  Leukocytosis most likely secondary to volume contraction from dehydration.  Check CBC tomorrow. Patient seen by Dr. Antionette Char this morning.  I agree with assessment and plan.  Addendum: BMP done this afternoon showed the gap has widened to 18.  I will continue insulin drip for now.  Keeping n.p.o.

## 2019-11-23 NOTE — H&P (Signed)
History and Physical    Sofia Jaquith QJJ:[id number] DOB: 05/10/1996 DOA: 11/23/2019  PCP: Harvel Quale, MD   Patient coming from: Home   Chief Complaint: N/V, hyperglycemia   HPI: Mason Mclaughlin is a 24 y.o. male with medical history significant for type 1 diabetes mellitus, hypertension, and poor adherence to his treatment plan, now presenting to the emergency department with nausea, vomiting, and hyperglycemia.  The patient reports that he developed nausea and vomiting last night without any abdominal pain and without diarrhea, fevers, or chills.  He denied hematemesis.  Patient follows with [facility] endocrinologist and uses an insulin pump but has canceled or missed his training and education sessions and his care has been complicated by noncompliance.  Patient denies any chest pain, cough, or shortness of breath.  ED Course: Upon arrival to the ED, patient is found to be afebrile, saturating well on room air, tachypneic, tachycardic, and hypertensive.  EKG features sinus tachycardia with rate 124.  Chemistry panel notable for potassium 6.9, bicarbonate 9, glucose 529, anion gap 26, and creatinine 1.58, up from 0.87 a month earlier.  CBC is notable for leukocytosis to 26,200 and a mild thrombocytosis.  Urinalysis features glucosuria, ketonuria, and proteinuria.  Patient was given 2 L of normal saline in the ED, started on 1/3 L, and given 10 units of IV NovoLog before starting insulin infusion.  COVID-19 screening test not yet resulted.  Review of Systems:  All other systems reviewed and apart from HPI, are negative.  Past Medical History:  Diagnosis Date  . Diabetes (HCC)   . DKA (diabetic ketoacidoses) (HCC)   . DKA, type 1 (HCC)   . Insulin pump in place     History reviewed. No pertinent surgical history.   reports that he has never smoked. He has never used smokeless tobacco. He reports current alcohol use. He reports current drug use. Frequency: 4.00 times per week. Drug:  Marijuana.  No Known Allergies  Family History  Problem Relation Age of Onset  . Healthy Mother   . Diabetes Father      Prior to Admission medications   Medication Sig Start Date End Date Taking? Authorizing Provider  insulin aspart (NOVOLOG) 100 UNIT/ML injection Inject 60 Units into the skin daily. Max dose 60 units 11/13/19  Yes [provider]  lisinopril (ZESTRIL) 10 MG tablet Take 10 mg by mouth daily.   Yes [provider]  Multiple Vitamin (MULTIVITAMIN WITH MINERALS) TABS tablet Take 1 tablet by mouth daily.   Yes [provider]  metoCLOPramide (REGLAN) 10 MG tablet Take 1 tablet (10 mg total) by mouth every 6 (six) hours. Patient taking differently: Take 10 mg by mouth every 6 (six) hours as needed for nausea or vomiting.  08/31/19   Couture, Cortni S, PA-C  potassium chloride SA (KLOR-CON) 20 MEQ tablet Take 1 tablet (20 mEq total) by mouth daily. Patient not taking: Reported on 09/30/2019 08/31/19   Cherly Anderson, PA-C    Physical Exam: Vitals:   11/23/19 0301 11/23/19 0330 11/23/19 0358 11/23/19 0428  BP: (!) 171/113 (!) 160/90 (!) 164/115 (!) 133/100  Pulse: (!) 126 (!) 121 (!) 131 (!) 130  Resp: 13 (!) 27    Temp:      TempSrc:      SpO2: 100% 100% 100% 100%  Weight:      Height:        Constitutional: NAD, calm  Eyes: PERTLA, lids and conjunctivae normal ENMT: Mucous membranes are moist.  Posterior pharynx clear of any exudate or lesions.   Neck: normal, supple, no masses, no thyromegaly Respiratory: mild tachypnea, no wheezing, no crackles. No accessory muscle use.  Cardiovascular: Rate ~120 and regular. No extremity edema.   Abdomen: No distension, no tenderness, soft. Bowel sounds active.  Musculoskeletal: no clubbing / cyanosis. No joint deformity upper and lower extremities.   Skin: no significant rashes, lesions, ulcers. Warm, dry, well-perfused. Neurologic: CN 2-12 grossly intact. Sensation intact. Moving all  extremities.  Psychiatric: Alert and oriented to person, place, and situation. Calm and cooperative.    Labs and Imaging on Admission: I have personally reviewed following labs and imaging studies  CBC: Recent Labs  Lab 11/23/19 0208  WBC 26.7*  HGB 16.7  HCT 49.3  MCV 87.4  PLT 418*   Basic Metabolic Panel: Recent Labs  Lab 11/23/19 0303  NA 130*  K 6.9*  CL 95*  CO2 9*  GLUCOSE 529*  BUN 21*  CREATININE 1.58*  CALCIUM 9.2   GFR: Estimated Creatinine Clearance: 59 mL/min (A) (by C-G formula based on SCr of 1.58 mg/dL (H)). Liver Function Tests: No results for input(s): AST, ALT, ALKPHOS, BILITOT, PROT, ALBUMIN in the last 168 hours. Recent Labs  Lab 11/23/19 0208  LIPASE 48   No results for input(s): AMMONIA in the last 168 hours. Coagulation Profile: No results for input(s): INR, PROTIME in the last 168 hours. Cardiac Enzymes: No results for input(s): CKTOTAL, CKMB, CKMBINDEX, TROPONINI in the last 168 hours. BNP (last 3 results) No results for input(s): PROBNP in the last 8760 hours. HbA1C: No results for input(s): HGBA1C in the last 72 hours. CBG: Recent Labs  Lab 11/23/19 0139 11/23/19 0311 11/23/19 0353 11/23/19 0449  GLUCAP 529* 493* 465* 319*   Lipid Profile: No results for input(s): CHOL, HDL, LDLCALC, TRIG, CHOLHDL, LDLDIRECT in the last 72 hours. Thyroid Function Tests: No results for input(s): TSH, T4TOTAL, FREET4, T3FREE, THYROIDAB in the last 72 hours. Anemia Panel: No results for input(s): VITAMINB12, FOLATE, FERRITIN, TIBC, IRON, RETICCTPCT in the last 72 hours. Urine analysis:    Component Value Date/Time   COLORURINE STRAW (A) 11/23/2019 0208   APPEARANCEUR CLEAR 11/23/2019 0208   LABSPEC 1.024 11/23/2019 0208   PHURINE 5.0 11/23/2019 0208   GLUCOSEU >=500 (A) 11/23/2019 0208   HGBUR SMALL (A) 11/23/2019 0208   BILIRUBINUR NEGATIVE 11/23/2019 0208   KETONESUR 80 (A) 11/23/2019 0208   PROTEINUR 30 (A) 11/23/2019 0208   NITRITE  NEGATIVE 11/23/2019 0208   LEUKOCYTESUR NEGATIVE 11/23/2019 0208   Sepsis Labs: @LABRCNTIP (procalcitonin:4,lacticidven:4) ) Recent Results (from the past 240 hour(s))  SARS Coronavirus 2 by RT PCR (hospital order, performed in [facility] hospital lab) Nasopharyngeal Nasopharyngeal Swab     Status: None   Collection Time: 11/23/19  3:24 AM   Specimen: Nasopharyngeal Swab  Result Value Ref Range Status   SARS Coronavirus 2 NEGATIVE NEGATIVE Final    Comment: (NOTE) SARS-CoV-2 target nucleic acids are NOT DETECTED.  The SARS-CoV-2 RNA is generally detectable in upper and lower respiratory specimens during the acute phase of infection. The lowest concentration of SARS-CoV-2 viral copies this assay can detect is 250 copies / mL. A negative result does not preclude SARS-CoV-2 infection and should not be used as the sole basis for treatment or other patient management decisions.  A negative result may occur with improper specimen collection / handling, submission of specimen other than nasopharyngeal swab, presence of viral mutation(s) within the areas targeted by this assay, and  inadequate number of viral copies (<250 copies / mL). A negative result must be combined with clinical observations, patient history, and epidemiological information.  Fact Sheet for Patients:   BoilerBrush.com.cy  Fact Sheet for Healthcare Providers: https://pope.com/  This test is not yet approved or  cleared by the [place] FDA and has been authorized for detection and/or diagnosis of SARS-CoV-2 by FDA under an Emergency Use Authorization (EUA).  This EUA will remain in effect (meaning this test can be used) for the duration of the COVID-19 declaration under Section 564(b)(1) of the Act, 21 U.S.C. section 360bbb-3(b)(1), unless the authorization is terminated or revoked sooner.  Performed at [facility], [address] [place], [place] [address]      Radiological Exams on Admission: No results found.  EKG: Independently reviewed. Sinus tachycardia (rate 124).   Assessment/Plan   1. DKA; type I DM  - Type I diabetic with hx of poor compliance and A1c 10.2% in May now presenting with several hours of N/V and found to be in DKA  - He was started on IVF and insulin infusion in ED  - Continue IVF hydration and continue insulin infusion with frequent CBGs and serial chem panels   2. Hyperkalemia; AKI  - Serum potassium is 6.9 in ED and SCr is 1.58, up from 0.87 a month ago  - Hyperkalemia likely secondary to insulin deficiency and AKI likely acute prerenal azotemia in setting of osmotic diuresis  - Anticipate normalization of both with continue IVF hydration, insulin infusion, and withholding ACE-i    - Continue cardiac monitoring and follow serial chem panels   3. Hypertension  - BP elevated in ED  - He is prescribed lisinopril but this will be held in light of hyperkalemia and AKI  - Use labetalol as needed for now    4. Leukocytosis  - WBC is 26,200 in ED without fever  - Likely reactive, culture if febrile and repeat CBC in am     DVT prophylaxis: Lovenox  Code Status: Full  Family Communication: Discussed with patient  Disposition Plan:  Patient is from: Home  Anticipated d/c is to: Home  Anticipated d/c date is: 11/25/19 Patient currently: in DKA  Consults called: None  Admission status: Inpatient     Briscoe Deutscher, MD [facility] Pager: See [email or web address]  If 7AM-7PM, please contact the daytime attending [email or web address]  11/23/2019, 4:51 AM

## 2019-11-24 LAB — BASIC METABOLIC PANEL
Anion gap: 12 (ref 5–15)
Anion gap: 14 (ref 5–15)
Anion gap: 15 (ref 5–15)
Anion gap: 16 — ABNORMAL HIGH (ref 5–15)
BUN: 10 mg/dL (ref 6–20)
BUN: 10 mg/dL (ref 6–20)
BUN: 8 mg/dL (ref 6–20)
BUN: 9 mg/dL (ref 6–20)
CO2: 18 mmol/L — ABNORMAL LOW (ref 22–32)
CO2: 20 mmol/L — ABNORMAL LOW (ref 22–32)
CO2: 21 mmol/L — ABNORMAL LOW (ref 22–32)
CO2: 21 mmol/L — ABNORMAL LOW (ref 22–32)
Calcium: 8.8 mg/dL — ABNORMAL LOW (ref 8.9–10.3)
Calcium: 8.8 mg/dL — ABNORMAL LOW (ref 8.9–10.3)
Calcium: 8.9 mg/dL (ref 8.9–10.3)
Calcium: 9.2 mg/dL (ref 8.9–10.3)
Chloride: 101 mmol/L (ref 98–111)
Chloride: 103 mmol/L (ref 98–111)
Chloride: 104 mmol/L (ref 98–111)
Chloride: 99 mmol/L (ref 98–111)
Creatinine, Ser: 0.72 mg/dL (ref 0.61–1.24)
Creatinine, Ser: 0.9 mg/dL (ref 0.61–1.24)
Creatinine, Ser: 0.96 mg/dL (ref 0.61–1.24)
Creatinine, Ser: 0.98 mg/dL (ref 0.61–1.24)
GFR calc Af Amer: >60 mL/min (ref >60)
GFR calc Af Amer: >60 mL/min (ref >60)
GFR calc Af Amer: >60 mL/min (ref >60)
GFR calc Af Amer: >60 mL/min (ref >60)
GFR calc non Af Amer: >60 mL/min (ref >60)
GFR calc non Af Amer: >60 mL/min (ref >60)
GFR calc non Af Amer: >60 mL/min (ref >60)
GFR calc non Af Amer: >60 mL/min (ref >60)
Glucose, Bld: 127 mg/dL — ABNORMAL HIGH (ref 70–99)
Glucose, Bld: 160 mg/dL — ABNORMAL HIGH (ref 70–99)
Glucose, Bld: 170 mg/dL — ABNORMAL HIGH (ref 70–99)
Glucose, Bld: 185 mg/dL — ABNORMAL HIGH (ref 70–99)
Potassium: 3.2 mmol/L — ABNORMAL LOW (ref 3.5–5.1)
Potassium: 3.3 mmol/L — ABNORMAL LOW (ref 3.5–5.1)
Potassium: 3.4 mmol/L — ABNORMAL LOW (ref 3.5–5.1)
Potassium: 3.4 mmol/L — ABNORMAL LOW (ref 3.5–5.1)
Sodium: 133 mmol/L — ABNORMAL LOW (ref 135–145)
Sodium: 136 mmol/L (ref 135–145)
Sodium: 136 mmol/L (ref 135–145)
Sodium: 139 mmol/L (ref 135–145)

## 2019-11-24 LAB — CBC WITH DIFFERENTIAL/PLATELET
Abs Immature Granulocytes: 0.11 10*3/uL — ABNORMAL HIGH (ref 0.00–0.07)
Basophils Absolute: 0 10*3/uL (ref 0.0–0.1)
Basophils Relative: 0 %
Eosinophils Absolute: 0 10*3/uL (ref 0.0–0.5)
Eosinophils Relative: 0 %
HCT: 38.9 % — ABNORMAL LOW (ref 39.0–52.0)
Hemoglobin: 13.5 g/dL (ref 13.0–17.0)
Immature Granulocytes: 1 %
Lymphocytes Relative: 11 %
Lymphs Abs: 1.8 10*3/uL (ref 0.7–4.0)
MCH: 29.3 pg (ref 26.0–34.0)
MCHC: 34.7 g/dL (ref 30.0–36.0)
MCV: 84.6 fL (ref 80.0–100.0)
Monocytes Absolute: 0.7 10*3/uL (ref 0.1–1.0)
Monocytes Relative: 5 %
Neutro Abs: 13.2 10*3/uL — ABNORMAL HIGH (ref 1.7–7.7)
Neutrophils Relative %: 83 %
Platelets: 323 10*3/uL (ref 150–400)
RBC: 4.6 MIL/uL (ref 4.22–5.81)
RDW: 12.9 % (ref 11.5–15.5)
WBC: 15.9 10*3/uL — ABNORMAL HIGH (ref 4.0–10.5)
nRBC: 0 % (ref 0.0–0.2)

## 2019-11-24 LAB — GLUCOSE, CAPILLARY
Glucose-Capillary: 136 mg/dL — ABNORMAL HIGH (ref 70–99)
Glucose-Capillary: 154 mg/dL — ABNORMAL HIGH (ref 70–99)
Glucose-Capillary: 200 mg/dL — ABNORMAL HIGH (ref 70–99)
Glucose-Capillary: 190 mg/dL — ABNORMAL HIGH (ref 70–99)
Glucose-Capillary: 164 mg/dL — ABNORMAL HIGH (ref 70–99)
Glucose-Capillary: 175 mg/dL — ABNORMAL HIGH (ref 70–99)
Glucose-Capillary: 196 mg/dL — ABNORMAL HIGH (ref 70–99)
Glucose-Capillary: 180 mg/dL — ABNORMAL HIGH (ref 70–99)
Glucose-Capillary: 174 mg/dL — ABNORMAL HIGH (ref 70–99)
Glucose-Capillary: 172 mg/dL — ABNORMAL HIGH (ref 70–99)
Glucose-Capillary: 146 mg/dL — ABNORMAL HIGH (ref 70–99)
Glucose-Capillary: 150 mg/dL — ABNORMAL HIGH (ref 70–99)
Glucose-Capillary: 174 mg/dL — ABNORMAL HIGH (ref 70–99)
Glucose-Capillary: 163 mg/dL — ABNORMAL HIGH (ref 70–99)
Glucose-Capillary: 122 mg/dL — ABNORMAL HIGH (ref 70–99)

## 2019-11-24 MED ORDER — POTASSIUM CHLORIDE 20 MEQ PO PACK
40.0000 meq | PACK | Freq: Once | ORAL | Status: AC
  Administered 2019-11-24: 40 meq via ORAL
  Filled 2019-11-24: qty 2

## 2019-11-24 MED ORDER — LISINOPRIL 10 MG PO TABS: 10.0000 mg | ORAL_TABLET | Freq: Every day | ORAL | 2 refills | Status: DC

## 2019-11-24 MED ORDER — INSULIN GLARGINE 100 UNIT/ML ~~LOC~~ SOLN
10.0000 [IU] | Freq: Once | SUBCUTANEOUS | Status: AC
  Administered 2019-11-24: 10 [IU] via SUBCUTANEOUS
  Filled 2019-11-24: qty 0.1

## 2019-11-24 MED ORDER — INSULIN ASPART 100 UNIT/ML ~~LOC~~ SOLN
2.0000 [IU] | SUBCUTANEOUS | Status: DC
  Administered 2019-11-24: 4 [IU] via SUBCUTANEOUS
  Administered 2019-11-24: 2 [IU] via SUBCUTANEOUS

## 2019-11-24 MED ORDER — PROMETHAZINE HCL 25 MG/ML IJ SOLN
12.5000 mg | Freq: Once | INTRAMUSCULAR | Status: AC
  Administered 2019-11-24: 12.5 mg via INTRAVENOUS
  Filled 2019-11-24: qty 1

## 2019-11-24 NOTE — Discharge Summary (Signed)
Physician Discharge Summary  Mason Mclaughlin JYN:[id number] DOB: [birth year] DOA: 11/23/2019  PCP: Harvel Quale, MD  Admit date: 11/23/2019 Discharge date: 11/24/2019  Admitted From: Home.  Disposition: Home  Recommendations for Outpatient Follow-up:  1. Follow up with PCP in 3 to 4 days. 2. Please obtain BMP/CBC in one week 3. Advised patient to resume home dosing of insulin.  Home Health: No Equipment/Devices: No  Discharge Condition: Stable  CODE STATUS:Full code Diet recommendation: Heart Healthy / Carb Modified / Regular / Dysphagia   Brief Summary/ Hospital course: Mason Mclaughlin is a 24 y.o. male with medical history significant for type 1 diabetes mellitus, hypertension, and poor adherence to his treatment plan, now presented in the emergency department with nausea, vomiting, and hyperglycemia.  The patient reports that he developed nausea and vomiting last night without any abdominal pain and without diarrhea, fevers, or chills.  He denied hematemesis.  Patient follows with [facility] endocrinologist and uses an insulin pump but has canceled or missed his training and education sessions and his care has been complicated by noncompliance.  Patient denies any chest pain, cough, or shortness of breath. Upon arrival to the ED, patient is found to be afebrile, tachypneic, tachycardic, and hypertensive.   Chemistry panel notable for potassium 6.9, bicarbonate 9, glucose 529, anion gap 26, and creatinine 1.58, up from 0.87 a month earlier.  CBC is notable for leukocytosis to 26,200 and a mild thrombocytosis.  Urinalysis features glucosuria, ketonuria, and proteinuria.  Patient was given 2 L of normal saline in the ED, started on 1/3 L, and given 10 units of IV NovoLog before starting insulin infusion. He was managed in the stepdown, blood sugar has improved below 250,  transitioned to subcu insulin and insulin drip discontinued .  Blood sugar has improved,  Patient denies nausea and  vomiting, He has eaten diabetic diet.  He tolerated well without nausea, vomiting.  Patient want to be discharged because today is his birthday and he want to celebrate with his family.  Anion gap has closed, patient is being discharged home, advised to follow-up with endocrinologist in 1 week.  Advised to resume his home dosing of insulin.  He was managed for below problems.  1. DKA; type I DM  >>>> Resolved - Type I diabetic with hx of poor compliance and A1c 10.2% in May now presenting with several hours of N/V and found to be in DKA  - He was started on IVF and insulin infusion in ED  - Continue IVF hydration and continue insulin infusion with frequent CBGs and serial chem panels -Anion gap closed, transitioned to subcu insulin, insulin drip discontinued.  Blood glucose improved  2. Hyperkalemia; AKI ->>>>Improved - Serum potassium is 6.9 in ED and SCr is 1.58, up from 0.87 a month ago  - Hyperkalemia likely secondary to insulin deficiency and AKI likely acute prerenal azotemia in setting of osmotic diuresis  - Anticipate normalization of both with continue IVF hydration, insulin infusion, and withholding ACE-i    - Continue cardiac monitoring and follow serial chem panels   3. Hypertension  - BP elevated in ED  - He is prescribed lisinopril but this will be held in light of hyperkalemia and AKI  - Use labetalol as needed for now    4. Leukocytosis  - WBC is 26,200 in ED without fever  - Likely reactive, culture if febrile and repeat CBC in am     Discharge Diagnoses:  Principal Problem:   DKA (diabetic ketoacidoses) (  HCC) Active Problems:   AKI (acute kidney injury) (HCC)   Hyperkalemia   Leukocytosis   Hypertension complicating diabetes (HCC)   Discharge Instructions   Allergies as of 11/24/2019   No Known Allergies     Medication List    TAKE these medications   lisinopril 10 MG tablet Commonly known as: ZESTRIL Take 10 mg by mouth daily.   metoCLOPramide 10  MG tablet Commonly known as: REGLAN Take 1 tablet (10 mg total) by mouth every 6 (six) hours. What changed:   when to take this  reasons to take this   multivitamin with minerals Tabs tablet Take 1 tablet by mouth daily.   NovoLOG 100 UNIT/ML injection Generic drug: insulin aspart Inject 60 Units into the skin daily. Max dose 60 units   potassium chloride SA 20 MEQ tablet Commonly known as: KLOR-CON Take 1 tablet (20 mEq total) by mouth daily.       Follow-up Information    Shariff, Charise Killian, MD Follow up.   Specialty: Endocrinology Contact information: [address] [place] [phone]              No Known Allergies  Consultations:  None.   Procedures/Studies:  No results found.    Subjective: Patient was seen and examined at bedside.  No overnight events.  He reports feeling much better.  He wants to be discharged home today because today is his birthday and want to celebrate with his family.  Anion gap is closed, his blood sugars has improved transition to subcu insulin.  Discharge Exam: Vitals:   11/24/19 1200 11/24/19 1300  BP: (!) 151/94   Pulse: 74   Resp: 18   Temp:  97.8 F (36.6 C)  SpO2: 100%    Vitals:   11/24/19 0800 11/24/19 0822 11/24/19 1200 11/24/19 1300  BP: 127/87  (!) 151/94   Pulse: (!) 106  74   Resp: (!) 22  18   Temp:  98 F (36.7 C)  97.8 F (36.6 C)  TempSrc:  Oral  Oral  SpO2: 100% 100% 100%   Weight:      Height:        General: Pt is alert, awake, not in acute distress Cardiovascular: RRR, S1/S2 +, no rubs, no gallops Respiratory: CTA bilaterally, no wheezing, no rhonchi Abdominal: Soft, NT, ND, bowel sounds + Extremities: no edema, no cyanosis    The results of significant diagnostics from this hospitalization (including imaging, microbiology, ancillary and laboratory) are listed below for reference.     Microbiology: Recent Results (from the past 240 hour(s))  SARS  Coronavirus 2 by RT PCR (hospital order, performed in [facility] hospital lab) Nasopharyngeal Nasopharyngeal Swab     Status: None   Collection Time: 11/23/19  3:24 AM   Specimen: Nasopharyngeal Swab  Result Value Ref Range Status   SARS Coronavirus 2 NEGATIVE NEGATIVE Final    Comment: (NOTE) SARS-CoV-2 target nucleic acids are NOT DETECTED.  The SARS-CoV-2 RNA is generally detectable in upper and lower respiratory specimens during the acute phase of infection. The lowest concentration of SARS-CoV-2 viral copies this assay can detect is 250 copies / mL. A negative result does not preclude SARS-CoV-2 infection and should not be used as the sole basis for treatment or other patient management decisions.  A negative result may occur with improper specimen collection / handling, submission of specimen other than nasopharyngeal swab, presence of viral mutation(s) within the areas targeted by this assay, and  inadequate number of viral copies (<250 copies / mL). A negative result must be combined with clinical observations, patient history, and epidemiological information.  Fact Sheet for Patients:   BoilerBrush.com.cy  Fact Sheet for Healthcare Providers: https://pope.com/  This test is not yet approved or  cleared by the [place] FDA and has been authorized for detection and/or diagnosis of SARS-CoV-2 by FDA under an Emergency Use Authorization (EUA).  This EUA will remain in effect (meaning this test can be used) for the duration of the COVID-19 declaration under Section 564(b)(1) of the Act, 21 U.S.C. section 360bbb-3(b)(1), unless the authorization is terminated or revoked sooner.  Performed at [facility], [address], [place], [place] [address]   MRSA PCR Screening     Status: None   Collection Time: 11/23/19  5:23 AM   Specimen: Nasal Mucosa; Nasopharyngeal  Result Value Ref Range Status   MRSA by  PCR NEGATIVE NEGATIVE Final    Comment:        The GeneXpert MRSA Assay (FDA approved for NASAL specimens only), is one component of a comprehensive MRSA colonization surveillance program. It is not intended to diagnose MRSA infection nor to guide or monitor treatment for MRSA infections. Performed at [facility], [address], [place], [place] [address]      Labs: BNP (last 3 results) No results for input(s): BNP in the last 8760 hours. Basic Metabolic Panel: Recent Labs  Lab 11/23/19 1719 11/23/19 2045 11/24/19 0038 11/24/19 0516 11/24/19 1018  NA 136 137 136 133* 136  K 3.8 3.6 3.4* 3.3* 3.4*  CL 105 101 103 99 101  CO2 18* 20* 21* 18* 20*  GLUCOSE 180* 133* 160* 185* 170*  BUN 12 10 10 10 9   CREATININE 0.83 0.89 0.98 0.96 0.90  CALCIUM 8.9 8.9 8.9 8.8* 9.2   Liver Function Tests: No results for input(s): AST, ALT, ALKPHOS, BILITOT, PROT, ALBUMIN in the last 168 hours. Recent Labs  Lab 11/23/19 0208  LIPASE 48   No results for input(s): AMMONIA in the last 168 hours. CBC: Recent Labs  Lab 11/23/19 0208 11/24/19 0038  WBC 26.7* 15.9*  NEUTROABS  --  13.2*  HGB 16.7 13.5  HCT 49.3 38.9*  MCV 87.4 84.6  PLT 418* 323   Cardiac Enzymes: No results for input(s): CKTOTAL, CKMB, CKMBINDEX, TROPONINI in the last 168 hours. BNP: Invalid input(s): POCBNP CBG: Recent Labs  Lab 11/24/19 1018 11/24/19 1149 11/24/19 1230 11/24/19 1412 11/24/19 1440  GLUCAP 172* 146* 150* 174* 163*   D-Dimer No results for input(s): DDIMER in the last 72 hours. Hgb A1c No results for input(s): HGBA1C in the last 72 hours. Lipid Profile No results for input(s): CHOL, HDL, LDLCALC, TRIG, CHOLHDL, LDLDIRECT in the last 72 hours. Thyroid function studies No results for input(s): TSH, T4TOTAL, T3FREE, THYROIDAB in the last 72 hours.  Invalid input(s): FREET3 Anemia work up No results for input(s): VITAMINB12, FOLATE, FERRITIN, TIBC, IRON, RETICCTPCT  in the last 72 hours. Urinalysis    Component Value Date/Time   COLORURINE STRAW (A) 11/23/2019 0208   APPEARANCEUR CLEAR 11/23/2019 0208   LABSPEC 1.024 11/23/2019 0208   PHURINE 5.0 11/23/2019 0208   GLUCOSEU >=500 (A) 11/23/2019 0208   HGBUR SMALL (A) 11/23/2019 0208   BILIRUBINUR NEGATIVE 11/23/2019 0208   KETONESUR 80 (A) 11/23/2019 0208   PROTEINUR 30 (A) 11/23/2019 0208   NITRITE NEGATIVE 11/23/2019 0208   LEUKOCYTESUR NEGATIVE 11/23/2019 0208   Sepsis Labs Invalid  input(s): PROCALCITONIN,  WBC,  LACTICIDVEN Microbiology Recent Results (from the past 240 hour(s))  SARS Coronavirus 2 by RT PCR (hospital order, performed in [facility] lab) Nasopharyngeal Nasopharyngeal Swab     Status: None   Collection Time: 11/23/19  3:24 AM   Specimen: Nasopharyngeal Swab  Result Value Ref Range Status   SARS Coronavirus 2 NEGATIVE NEGATIVE Final    Comment: (NOTE) SARS-CoV-2 target nucleic acids are NOT DETECTED.  The SARS-CoV-2 RNA is generally detectable in upper and lower respiratory specimens during the acute phase of infection. The lowest concentration of SARS-CoV-2 viral copies this assay can detect is 250 copies / mL. A negative result does not preclude SARS-CoV-2 infection and should not be used as the sole basis for treatment or other patient management decisions.  A negative result may occur with improper specimen collection / handling, submission of specimen other than nasopharyngeal swab, presence of viral mutation(s) within the areas targeted by this assay, and inadequate number of viral copies (<250 copies / mL). A negative result must be combined with clinical observations, patient history, and epidemiological information.  Fact Sheet for Patients:   BoilerBrush.com.cyhttps://www.fda.gov/media/136312/download  Fact Sheet for Healthcare Providers: https://pope.com/https://www.fda.gov/media/136313/download  This test is not yet approved or  cleared by the [place] FDA and has been  authorized for detection and/or diagnosis of SARS-CoV-2 by FDA under an Emergency Use Authorization (EUA).  This EUA will remain in effect (meaning this test can be used) for the duration of the COVID-19 declaration under Section 564(b)(1) of the Act, 21 U.S.C. section 360bbb-3(b)(1), unless the authorization is terminated or revoked sooner.  Performed at [facility], [address] [place], [place] [address]   MRSA PCR Screening     Status: None   Collection Time: 11/23/19  5:23 AM   Specimen: Nasal Mucosa; Nasopharyngeal  Result Value Ref Range Status   MRSA by PCR NEGATIVE NEGATIVE Final    Comment:        The GeneXpert MRSA Assay (FDA approved for NASAL specimens only), is one component of a comprehensive MRSA colonization surveillance program. It is not intended to diagnose MRSA infection nor to guide or monitor treatment for MRSA infections. Performed at [facility], [address] [place], [place] [address]      Time coordinating discharge: Over 30 minutes  SIGNED:   Cipriano BunkerPARDEEP Lowry Bala, MD  [facility] 11/24/2019, 3:25 PM   If 7PM-7AM, please contact night-coverage [email or web address]

## 2019-11-24 NOTE — Progress Notes (Signed)
Discharge instructions provided and discussed with patient and his significant other. Pt states that he has his appropriate insulin and blood sugar measurement supplies at home. Doctor Lucianne Muss made aware of patient's need for an updated prescription on his lisinopril. All questions answered. IV removed. Patient taken via wheelchair to hospital exit by NT at 1720.

## 2019-11-24 NOTE — Discharge Instructions (Signed)
Advised to follow-up with primary care physician in 1 week. Advised to resume home dose of insulin.  Patient reports being compliant with his insulin pump.

## 2020-05-04 ENCOUNTER — Other Ambulatory Visit: Payer: Self-pay

## 2020-05-04 ENCOUNTER — Encounter (HOSPITAL_COMMUNITY): Payer: Self-pay | Admitting: Emergency Medicine

## 2020-05-04 ENCOUNTER — Emergency Department (HOSPITAL_COMMUNITY)
Admission: EM | Admit: 2020-05-04 | Discharge: 2020-05-04 | Disposition: A | Discharge status: Home / Self Care | Payer: BC Managed Care – PPO | Attending: Emergency Medicine | Admitting: Emergency Medicine

## 2020-05-04 DIAGNOSIS — R112 Nausea with vomiting, unspecified: Secondary | ICD-10-CM | POA: Diagnosis not present

## 2020-05-04 DIAGNOSIS — Z5321 Procedure and treatment not carried out due to patient leaving prior to being seen by health care provider: Secondary | ICD-10-CM | POA: Insufficient documentation

## 2020-05-04 DIAGNOSIS — R109 Unspecified abdominal pain: Secondary | ICD-10-CM | POA: Diagnosis not present

## 2020-05-04 LAB — CBG MONITORING, ED: Glucose-Capillary: 199 mg/dL — ABNORMAL HIGH (ref 70–99)

## 2020-05-04 MED ORDER — ONDANSETRON 4 MG PO TBDP
4.0000 mg | ORAL_TABLET | Freq: Once | ORAL | Status: AC | PRN
  Administered 2020-05-04: 4 mg via ORAL
  Filled 2020-05-04: qty 1

## 2020-05-04 NOTE — ED Notes (Signed)
Called with no answer from lobby

## 2020-05-04 NOTE — ED Notes (Signed)
I called patient for a blood draw and no one responded

## 2020-05-04 NOTE — ED Triage Notes (Signed)
Pt c/o abd pains with n/v for a day. Denies urinary or bowel problems.

## 2020-05-05 ENCOUNTER — Emergency Department (HOSPITAL_COMMUNITY): Payer: BC Managed Care – PPO

## 2020-05-05 ENCOUNTER — Observation Stay (HOSPITAL_COMMUNITY)
Admission: EM | Admit: 2020-05-05 | Discharge: 2020-05-06 | Disposition: A | Discharge status: Home / Self Care | Payer: BC Managed Care – PPO | Attending: Internal Medicine | Admitting: Internal Medicine

## 2020-05-05 ENCOUNTER — Encounter (HOSPITAL_COMMUNITY): Payer: Self-pay

## 2020-05-05 ENCOUNTER — Other Ambulatory Visit: Payer: Self-pay

## 2020-05-05 DIAGNOSIS — I10 Essential (primary) hypertension: Secondary | ICD-10-CM | POA: Diagnosis not present

## 2020-05-05 DIAGNOSIS — Z20822 Contact with and (suspected) exposure to covid-19: Secondary | ICD-10-CM | POA: Diagnosis not present

## 2020-05-05 DIAGNOSIS — E1110 Type 2 diabetes mellitus with ketoacidosis without coma: Secondary | ICD-10-CM | POA: Diagnosis present

## 2020-05-05 DIAGNOSIS — R112 Nausea with vomiting, unspecified: Principal | ICD-10-CM | POA: Insufficient documentation

## 2020-05-05 DIAGNOSIS — Z79899 Other long term (current) drug therapy: Secondary | ICD-10-CM | POA: Diagnosis not present

## 2020-05-05 DIAGNOSIS — Z794 Long term (current) use of insulin: Secondary | ICD-10-CM | POA: Diagnosis not present

## 2020-05-05 DIAGNOSIS — R739 Hyperglycemia, unspecified: Secondary | ICD-10-CM

## 2020-05-05 DIAGNOSIS — E1010 Type 1 diabetes mellitus with ketoacidosis without coma: Secondary | ICD-10-CM | POA: Diagnosis not present

## 2020-05-05 DIAGNOSIS — R030 Elevated blood-pressure reading, without diagnosis of hypertension: Secondary | ICD-10-CM | POA: Diagnosis present

## 2020-05-05 HISTORY — DX: Type 2 diabetes mellitus with ketoacidosis without coma: E11.10

## 2020-05-05 LAB — URINALYSIS, ROUTINE W REFLEX MICROSCOPIC
Bacteria, UA: NONE SEEN
Bilirubin Urine: NEGATIVE
Glucose, UA: >=500 mg/dL — AB
Ketones, ur: 20 mg/dL — AB
Leukocytes,Ua: NEGATIVE
Nitrite: NEGATIVE
Protein, ur: 100 mg/dL — AB
Specific Gravity, Urine: 1.021 (ref 1.005–1.030)
pH: 6 (ref 5.0–8.0)

## 2020-05-05 LAB — BASIC METABOLIC PANEL
Anion gap: 11 (ref 5–15)
BUN: 17 mg/dL (ref 6–20)
CO2: 29 mmol/L (ref 22–32)
Calcium: 9.1 mg/dL (ref 8.9–10.3)
Chloride: 93 mmol/L — ABNORMAL LOW (ref 98–111)
Creatinine, Ser: 0.91 mg/dL (ref 0.61–1.24)
GFR, Estimated: >60 mL/min (ref >60)
Glucose, Bld: 137 mg/dL — ABNORMAL HIGH (ref 70–99)
Potassium: 2.9 mmol/L — ABNORMAL LOW (ref 3.5–5.1)
Sodium: 133 mmol/L — ABNORMAL LOW (ref 135–145)

## 2020-05-05 LAB — CBC
HCT: 46 % (ref 39.0–52.0)
Hemoglobin: 16.5 g/dL (ref 13.0–17.0)
MCH: 29.7 pg (ref 26.0–34.0)
MCHC: 35.9 g/dL (ref 30.0–36.0)
MCV: 82.7 fL (ref 80.0–100.0)
Platelets: 278 10*3/uL (ref 150–400)
RBC: 5.56 MIL/uL (ref 4.22–5.81)
RDW: 12.3 % (ref 11.5–15.5)
WBC: 17.9 10*3/uL — ABNORMAL HIGH (ref 4.0–10.5)
nRBC: 0 % (ref 0.0–0.2)

## 2020-05-05 LAB — COMPREHENSIVE METABOLIC PANEL
ALT: 40 U/L (ref 0–44)
AST: 35 U/L (ref 15–41)
Albumin: 4.9 g/dL (ref 3.5–5.0)
Alkaline Phosphatase: 83 U/L (ref 38–126)
Anion gap: 19 — ABNORMAL HIGH (ref 5–15)
BUN: 18 mg/dL (ref 6–20)
CO2: 21 mmol/L — ABNORMAL LOW (ref 22–32)
Calcium: 9.6 mg/dL (ref 8.9–10.3)
Chloride: 92 mmol/L — ABNORMAL LOW (ref 98–111)
Creatinine, Ser: 0.97 mg/dL (ref 0.61–1.24)
GFR, Estimated: >60 mL/min (ref >60)
Glucose, Bld: 258 mg/dL — ABNORMAL HIGH (ref 70–99)
Potassium: 2.8 mmol/L — ABNORMAL LOW (ref 3.5–5.1)
Sodium: 132 mmol/L — ABNORMAL LOW (ref 135–145)
Total Bilirubin: 1.9 mg/dL — ABNORMAL HIGH (ref 0.3–1.2)
Total Protein: 8.8 g/dL — ABNORMAL HIGH (ref 6.5–8.1)

## 2020-05-05 LAB — BETA-HYDROXYBUTYRIC ACID: Beta-Hydroxybutyric Acid: 0.77 mmol/L — ABNORMAL HIGH (ref 0.05–0.27)

## 2020-05-05 LAB — BLOOD GAS, VENOUS
Acid-Base Excess: 2.6 mmol/L — ABNORMAL HIGH (ref 0.0–2.0)
Bicarbonate: 23.8 mmol/L (ref 20.0–28.0)
O2 Saturation: 78.8 %
Patient temperature: 98.6
pCO2, Ven: 28.2 mmHg — ABNORMAL LOW (ref 44.0–60.0)
pH, Ven: 7.537 — ABNORMAL HIGH (ref 7.250–7.430)
pO2, Ven: 43 mmHg (ref 32.0–45.0)

## 2020-05-05 LAB — CBG MONITORING, ED
Glucose-Capillary: 300 mg/dL — ABNORMAL HIGH (ref 70–99)
Glucose-Capillary: 314 mg/dL — ABNORMAL HIGH (ref 70–99)
Glucose-Capillary: 128 mg/dL — ABNORMAL HIGH (ref 70–99)
Glucose-Capillary: 213 mg/dL — ABNORMAL HIGH (ref 70–99)

## 2020-05-05 LAB — MAGNESIUM: Magnesium: 2.1 mg/dL (ref 1.7–2.4)

## 2020-05-05 LAB — TROPONIN I (HIGH SENSITIVITY)
Troponin I (High Sensitivity): 4 ng/L (ref <18)
Troponin I (High Sensitivity): 6 ng/L (ref <18)

## 2020-05-05 LAB — RESP PANEL BY RT-PCR (FLU A&B, COVID) ARPGX2
Influenza A by PCR: NEGATIVE
Influenza B by PCR: NEGATIVE
SARS Coronavirus 2 by RT PCR: NEGATIVE

## 2020-05-05 LAB — LIPASE, BLOOD: Lipase: 23 U/L (ref 11–51)

## 2020-05-05 MED ORDER — POTASSIUM CHLORIDE 10 MEQ/100ML IV SOLN
10.0000 meq | INTRAVENOUS | Status: AC
  Administered 2020-05-05 – 2020-05-06 (×5): 10 meq via INTRAVENOUS
  Filled 2020-05-05 (×5): qty 100

## 2020-05-05 MED ORDER — ONDANSETRON HCL 4 MG/2ML IJ SOLN
4.0000 mg | Freq: Once | INTRAMUSCULAR | Status: AC
  Administered 2020-05-05: 4 mg via INTRAVENOUS
  Filled 2020-05-05: qty 2

## 2020-05-05 MED ORDER — DEXTROSE IN LACTATED RINGERS 5 % IV SOLN: INTRAVENOUS | Status: DC

## 2020-05-05 MED ORDER — ZOLPIDEM TARTRATE 5 MG PO TABS
5.0000 mg | ORAL_TABLET | Freq: Once | ORAL | Status: AC
  Administered 2020-05-06: 5 mg via ORAL
  Filled 2020-05-05: qty 1

## 2020-05-05 MED ORDER — INSULIN ASPART 100 UNIT/ML ~~LOC~~ SOLN
0.0000 [IU] | SUBCUTANEOUS | Status: DC
  Administered 2020-05-05 – 2020-05-06 (×2): 3 [IU] via SUBCUTANEOUS
  Administered 2020-05-06: 2 [IU] via SUBCUTANEOUS
  Administered 2020-05-06: 3 [IU] via SUBCUTANEOUS
  Filled 2020-05-05: qty 0.09

## 2020-05-05 MED ORDER — POTASSIUM CHLORIDE 10 MEQ/100ML IV SOLN: 10.0000 meq | INTRAVENOUS | Status: DC

## 2020-05-05 MED ORDER — ONDANSETRON HCL 4 MG PO TABS: 4.0000 mg | ORAL_TABLET | Freq: Four times a day (QID) | ORAL | Status: DC | PRN

## 2020-05-05 MED ORDER — ACETAMINOPHEN 325 MG PO TABS: 650.0000 mg | ORAL_TABLET | Freq: Four times a day (QID) | ORAL | Status: DC | PRN

## 2020-05-05 MED ORDER — LACTATED RINGERS IV SOLN: INTRAVENOUS | Status: DC

## 2020-05-05 MED ORDER — POTASSIUM CHLORIDE CRYS ER 20 MEQ PO TBCR
40.0000 meq | EXTENDED_RELEASE_TABLET | Freq: Once | ORAL | Status: AC
  Administered 2020-05-05: 40 meq via ORAL
  Filled 2020-05-05: qty 2

## 2020-05-05 MED ORDER — FENTANYL CITRATE (PF) 100 MCG/2ML IJ SOLN: 50.0000 ug | INTRAMUSCULAR | Status: DC | PRN

## 2020-05-05 MED ORDER — INSULIN GLARGINE 100 UNIT/ML ~~LOC~~ SOLN
15.0000 [IU] | Freq: Every day | SUBCUTANEOUS | Status: DC
  Filled 2020-05-05: qty 0.15

## 2020-05-05 MED ORDER — ONDANSETRON HCL 4 MG/2ML IJ SOLN
4.0000 mg | Freq: Four times a day (QID) | INTRAMUSCULAR | Status: DC | PRN
  Administered 2020-05-06: 4 mg via INTRAVENOUS
  Filled 2020-05-05: qty 2

## 2020-05-05 MED ORDER — ENOXAPARIN SODIUM 40 MG/0.4ML ~~LOC~~ SOLN
40.0000 mg | SUBCUTANEOUS | Status: DC
  Administered 2020-05-05: 40 mg via SUBCUTANEOUS
  Filled 2020-05-05: qty 0.4

## 2020-05-05 MED ORDER — INSULIN REGULAR(HUMAN) IN NACL 100-0.9 UT/100ML-% IV SOLN
INTRAVENOUS | Status: DC
  Filled 2020-05-05: qty 100

## 2020-05-05 MED ORDER — HYDRALAZINE HCL 20 MG/ML IJ SOLN
10.0000 mg | INTRAMUSCULAR | Status: DC | PRN
  Filled 2020-05-05: qty 1

## 2020-05-05 MED ORDER — DEXTROSE 50 % IV SOLN: 0.0000 mL | INTRAVENOUS | Status: DC | PRN

## 2020-05-05 MED ORDER — ACETAMINOPHEN 650 MG RE SUPP: 650.0000 mg | Freq: Four times a day (QID) | RECTAL | Status: DC | PRN

## 2020-05-05 MED ORDER — LACTATED RINGERS IV BOLUS
2000.0000 mL | Freq: Once | INTRAVENOUS | Status: AC
  Administered 2020-05-05: 2000 mL via INTRAVENOUS

## 2020-05-05 NOTE — ED Triage Notes (Signed)
Patient c/o chest pain x 3 days. patient's significant other reported that the patient's CBG at home was 304. patient drowsy in triage.  CBG in triage-300.

## 2020-05-05 NOTE — H&P (Signed)
History and Physical    Mason Mclaughlin ERX:[id number] DOB: 16-Mar-1996 DOA: 05/05/2020  PCP: Harvel Quale, MD  Patient coming from: Home.  Chief Complaint: Nausea vomiting.  HPI: Mason Mclaughlin is a 24 y.o. male with history of diabetes mellitus type 1 on insulin pump has been experiencing intractable nausea vomiting with epigastric discomfort over the last 24 hours.  Patient admits to have drank some alcohol 3 days ago but states he does not drink alcohol every day.  He states he has been using his insulin pump.  Denies any blood in the vomitus.  Epigastric pain is burning sensation nonradiating.  No chest pain or shortness of breath.  ED Course: In the ER patient was afebrile and not hypoxic.  Patient's initial blood glucose was 250 with an anion gap of 19.  Improved with fluid bolus anion gap improved to 11 and blood glucose of 138.  Since patient was still having nausea admitted for further observation.  LFTs unremarkable.  Lipase was normal.  Abdomen appears benign on exam.  Review of Systems: As per HPI, rest all negative.   Past Medical History:  Diagnosis Date  . Diabetes (HCC)   . DKA (diabetic ketoacidoses)   . DKA, type 1 (HCC)   . Insulin pump in place     History reviewed. No pertinent surgical history.   reports that he has never smoked. He has never used smokeless tobacco. He reports current alcohol use. He reports current drug use. Frequency: 4.00 times per week. Drug: Marijuana.  No Known Allergies  Family History  Problem Relation Age of Onset  . Healthy Mother   . Diabetes Father     Prior to Admission medications   Medication Sig Start Date End Date Taking? Authorizing Provider  insulin aspart (NOVOLOG) 100 UNIT/ML injection Inject 60 Units into the skin daily. Max dose 60 units 11/13/19   [provider]  lisinopril (ZESTRIL) 10 MG tablet Take 1 tablet (10 mg total) by mouth daily. 11/24/19   Cipriano Bunker, MD  metoCLOPramide (REGLAN) 10  MG tablet Take 1 tablet (10 mg total) by mouth every 6 (six) hours. Patient taking differently: Take 10 mg by mouth every 6 (six) hours as needed for nausea or vomiting.  08/31/19   Couture, Cortni S, PA-C  Multiple Vitamin (MULTIVITAMIN WITH MINERALS) TABS tablet Take 1 tablet by mouth daily.    [provider]  potassium chloride SA (KLOR-CON) 20 MEQ tablet Take 1 tablet (20 mEq total) by mouth daily. Patient not taking: Reported on 09/30/2019 08/31/19   Petrucelli, Pleas Koch, PA-C    Physical Exam: Constitutional: Moderately built and nourished. Vitals:   05/05/20 2030 05/05/20 2100 05/05/20 2130 05/05/20 2200  BP: (!) 152/103 (!) 155/139 (!) 164/115 (!) 160/105  Pulse: 75 (!) 110 86 70  Resp: 13 14 17 12   Temp:      TempSrc:      SpO2: 100% 98% 97% 100%  Weight:      Height:       Eyes: Anicteric no pallor. ENMT: No discharge from the ears eyes nose or mouth. Neck: No mass felt.  No neck rigidity. Respiratory: No rhonchi or crepitations. Cardiovascular: S1-S2 heard. Abdomen: Soft nontender bowel sounds present. Musculoskeletal: No edema. Skin: No rash. Neurologic: Alert awake oriented to time place and person.  Moves all extremities. Psychiatric: Appears normal.  Normal affect.   Labs on Admission: I have personally reviewed following labs and imaging studies  CBC: Recent Labs  Lab 05/05/20  1629  WBC 17.9*  HGB 16.5  HCT 46.0  MCV 82.7  PLT 278   Basic Metabolic Panel: Recent Labs  Lab 05/05/20 1652 05/05/20 2018 05/05/20 2055  NA 132*  --  133*  K 2.8*  --  2.9*  CL 92*  --  93*  CO2 21*  --  29  GLUCOSE 258*  --  137*  BUN 18  --  17  CREATININE 0.97  --  0.91  CALCIUM 9.6  --  9.1  MG  --  2.1  --    GFR: Estimated Creatinine Clearance: 109.8 mL/min (by C-G formula based on SCr of 0.91 mg/dL). Liver Function Tests: Recent Labs  Lab 05/05/20 1652  AST 35  ALT 40  ALKPHOS 83  BILITOT 1.9*  PROT 8.8*  ALBUMIN 4.9   Recent Labs  Lab  05/05/20 1652  LIPASE 23   No results for input(s): AMMONIA in the last 168 hours. Coagulation Profile: No results for input(s): INR, PROTIME in the last 168 hours. Cardiac Enzymes: No results for input(s): CKTOTAL, CKMB, CKMBINDEX, TROPONINI in the last 168 hours. BNP (last 3 results) No results for input(s): PROBNP in the last 8760 hours. HbA1C: No results for input(s): HGBA1C in the last 72 hours. CBG: Recent Labs  Lab 05/04/20 0848 05/05/20 1621 05/05/20 1700 05/05/20 2043  GLUCAP 199* 300* 314* 128*   Lipid Profile: No results for input(s): CHOL, HDL, LDLCALC, TRIG, CHOLHDL, LDLDIRECT in the last 72 hours. Thyroid Function Tests: No results for input(s): TSH, T4TOTAL, FREET4, T3FREE, THYROIDAB in the last 72 hours. Anemia Panel: No results for input(s): VITAMINB12, FOLATE, FERRITIN, TIBC, IRON, RETICCTPCT in the last 72 hours. Urine analysis:    Component Value Date/Time   COLORURINE YELLOW 05/05/2020 2041   APPEARANCEUR CLEAR 05/05/2020 2041   LABSPEC 1.021 05/05/2020 2041   PHURINE 6.0 05/05/2020 2041   GLUCOSEU >=500 (A) 05/05/2020 2041   HGBUR SMALL (A) 05/05/2020 2041   BILIRUBINUR NEGATIVE 05/05/2020 2041   KETONESUR 20 (A) 05/05/2020 2041   PROTEINUR 100 (A) 05/05/2020 2041   NITRITE NEGATIVE 05/05/2020 2041   LEUKOCYTESUR NEGATIVE 05/05/2020 2041   Sepsis Labs: @LABRCNTIP (procalcitonin:4,lacticidven:4) ) Recent Results (from the past 240 hour(s))  Resp Panel by RT-PCR (Flu A&B, Covid) Nasopharyngeal Swab     Status: None   Collection Time: 05/05/20  8:50 PM   Specimen: Nasopharyngeal Swab; Nasopharyngeal(NP) swabs in vial transport medium  Result Value Ref Range Status   SARS Coronavirus 2 by RT PCR NEGATIVE NEGATIVE Final    Comment: (NOTE) SARS-CoV-2 target nucleic acids are NOT DETECTED.  The SARS-CoV-2 RNA is generally detectable in upper respiratory specimens during the acute phase of infection. The lowest concentration of SARS-CoV-2 viral  copies this assay can detect is 138 copies/mL. A negative result does not preclude SARS-Cov-2 infection and should not be used as the sole basis for treatment or other patient management decisions. A negative result may occur with  improper specimen collection/handling, submission of specimen other than nasopharyngeal swab, presence of viral mutation(s) within the areas targeted by this assay, and inadequate number of viral copies(<138 copies/mL). A negative result must be combined with clinical observations, patient history, and epidemiological information. The expected result is Negative.  Fact Sheet for Patients:  05/07/20  Fact Sheet for Healthcare Providers:  BloggerCourse.com  This test is no t yet approved or cleared by the [place] FDA and  has been authorized for detection and/or diagnosis of SARS-CoV-2 by FDA under an Emergency  Use Authorization (EUA). This EUA will remain  in effect (meaning this test can be used) for the duration of the COVID-19 declaration under Section 564(b)(1) of the Act, 21 U.S.C.section 360bbb-3(b)(1), unless the authorization is terminated  or revoked sooner.       Influenza A by PCR NEGATIVE NEGATIVE Final   Influenza B by PCR NEGATIVE NEGATIVE Final    Comment: (NOTE) The Xpert Xpress SARS-CoV-2/FLU/RSV plus assay is intended as an aid in the diagnosis of influenza from Nasopharyngeal swab specimens and should not be used as a sole basis for treatment. Nasal washings and aspirates are unacceptable for Xpert Xpress SARS-CoV-2/FLU/RSV testing.  Fact Sheet for Patients: BloggerCourse.com  Fact Sheet for Healthcare Providers: SeriousBroker.it  This test is not yet approved or cleared by the [place] FDA and has been authorized for detection and/or diagnosis of SARS-CoV-2 by FDA under an Emergency Use Authorization (EUA). This  EUA will remain in effect (meaning this test can be used) for the duration of the COVID-19 declaration under Section 564(b)(1) of the Act, 21 U.S.C. section 360bbb-3(b)(1), unless the authorization is terminated or revoked.  Performed at [facility], [address] [place], [place] [address]      Radiological Exams on Admission: DG Chest 2 View  Result Date: 05/05/2020 CLINICAL DATA:  Chest pain EXAM: CHEST - 2 VIEW COMPARISON:  08/31/2019 FINDINGS: The heart size and mediastinal contours are within normal limits. Both lungs are clear. The visualized skeletal structures are unremarkable. IMPRESSION: No acute abnormality of the lungs. Electronically Signed   By: Lauralyn Primes M.D.   On: 05/05/2020 17:36    EKG: Independently reviewed.  Sinus tachycardia.  Assessment/Plan Principal Problem:   Nausea & vomiting Active Problems:   Elevated blood pressure reading   DKA (diabetic ketoacidosis) (HCC)    1. Early DKA likely precipitated by nausea vomiting.  Anion gap improved with fluids.  For now I have ordered Lantus and sliding scale coverage.  If anion gap continues to be improving restart patient's insulin pump. 2. Intractable nausea vomiting likely from uncontrolled diabetes and diabetic gastroparesis.  Abdomen appears benign on exam LFTs and lipase are normal.  We will continue to closely monitor.  Hydrate. 3. Hypokalemia likely from vomiting replace and recheck. 4. Hypertension uncontrolled -patient states he has not been taking his antihypertensives.  We will keep patient on as needed IV hydralazine for now follow blood pressure trends may need definite antihypertensive at the time of discharge.   DVT prophylaxis: Lovenox. Code Status: Full code. Family Communication: Discussed with patient. Disposition Plan: Home. Consults called: None. Admission status: Inpatient.   Eduard Clos MD [facility] Pager [phone].  If 7PM-7AM, please  contact night-coverage [email or web address] Password [email or web address]  05/05/2020, 10:14 PM

## 2020-05-05 NOTE — ED Provider Notes (Signed)
[facility]-EMERGENCY DEPT Provider Note   CSN: [id number] Arrival date & time: 05/05/20  1609     History Chief Complaint  Patient presents with  . Chest Pain  . Shortness of Breath  . Hyperglycemia    Mason Mclaughlin is a 24 y.o. male.  HPI  Patient is a 24 year old male with past medical history significant for DM1 with insulin pump in place, DKA, AKI secondary to DKA.  Patient is presented today with 3 days of nausea, vomiting, abdominal pain, lower sternal chest pain that is worse with vomiting.  He also feels fatigued and dehydrated.  He presented to the ER today due to concerns for DKA which is happened in the past several times for him.  He states he feels similar to how he has felt with DKA in the past.  Patient states that he drank liquor 4 nights ago and in the morning 3 days ago felt dehydrated, felt nauseous, had abdominal pain, and had several episodes of vomiting.  He states that he has left his insulin pump in place and it seems to still be working he states that it is currently infusing at this time. He states that the chest pain he has had is been epigastric in location.  He denies any radiation of the pain denies any diaphoresis arm or neck pain.  Denies any cardiac problems or history of VTE.  Denies any other associated symptoms such as fevers, chills, cough, congestion, changes in bowel movements, dysuria, frequency or urgency.     Past Medical History:  Diagnosis Date  . Diabetes (HCC)   . DKA (diabetic ketoacidoses)   . DKA, type 1 (HCC)   . Insulin pump in place     Patient Active Problem List   Diagnosis Date Noted  . DKA (diabetic ketoacidosis) (HCC) 05/05/2020  . Nausea & vomiting 05/05/2020  . Hyperkalemia 11/23/2019  . Leukocytosis 11/23/2019  . Hypertension complicating diabetes (HCC) 11/23/2019  . Type 1 diabetes mellitus with ketoacidosis without coma (HCC)   . Hyperglycemia 07/06/2019  . Elevated blood pressure  reading 07/06/2019  . ARF (acute renal failure) (HCC) 04/17/2018  . AKI (acute kidney injury) (HCC)   . Nausea and vomiting   . DKA (diabetic ketoacidoses) 05/29/2017    History reviewed. No pertinent surgical history.     Family History  Problem Relation Age of Onset  . Healthy Mother   . Diabetes Father     Social History   Tobacco Use  . Smoking status: Never Smoker  . Smokeless tobacco: Never Used  Vaping Use  . Vaping Use: Never used  Substance Use Topics  . Alcohol use: Yes  . Drug use: Yes    Frequency: 4.0 times per week    Types: Marijuana    Home Medications Prior to Admission medications   Medication Sig Start Date End Date Taking? Authorizing Provider  insulin aspart (NOVOLOG) 100 UNIT/ML injection Inject 60 Units into the skin daily. Max dose 60 units 11/13/19  Yes [provider]    Allergies    Patient has no known allergies.  Review of Systems   Review of Systems  Constitutional: Positive for fatigue. Negative for chills and fever.  HENT: Negative for congestion.   Eyes: Negative for pain.  Respiratory: Negative for cough and shortness of breath.   Cardiovascular: Negative for chest pain and leg swelling.  Gastrointestinal: Positive for abdominal pain, nausea and vomiting. Negative for constipation and diarrhea.  Genitourinary: Negative for dysuria.  Musculoskeletal: Negative for myalgias.  Skin: Negative for rash.  Neurological: Negative for dizziness and headaches.    Physical Exam Updated Vital Signs BP (!) 179/106   Pulse 65   Temp 98.9 F (37.2 C) (Oral)   Resp 15   Ht  (1.651 m)   Wt 66.2 kg   SpO2 100%   BMI 24.30 kg/m   Physical Exam Vitals and nursing note reviewed.  Constitutional:      General: He is in acute distress.     Comments: Patient appears ill and uncomfortable.  HENT:     Head: Normocephalic and atraumatic.     Nose: Nose normal.     Mouth/Throat:     Mouth: Mucous membranes are dry.  Eyes:      General: No scleral icterus. Cardiovascular:     Rate and Rhythm: Regular rhythm. Tachycardia present.     Pulses: Normal pulses.     Heart sounds: Normal heart sounds.     Comments: Significant tachycardia heart rate of 120 Bilateral radial artery pulses are 3+ and symmetric Pulmonary:     Effort: Pulmonary effort is normal. No respiratory distress.     Breath sounds: No wheezing.  Abdominal:     Palpations: Abdomen is soft.     Tenderness: There is no abdominal tenderness. There is no right CVA tenderness, left CVA tenderness, guarding or rebound.  Musculoskeletal:     Cervical back: Normal range of motion.     Right lower leg: No edema.     Left lower leg: No edema.  Skin:    General: Skin is warm and dry.     Capillary Refill: Capillary refill takes less than 2 seconds.  Neurological:     Mental Status: He is alert. Mental status is at baseline.  Psychiatric:        Mood and Affect: Mood normal.        Behavior: Behavior normal.     ED Results / Procedures / Treatments   Labs (all labs ordered are listed, but only abnormal results are displayed) Labs Reviewed  CBC - Abnormal; Notable for the following components:      Result Value   WBC 17.9 (*)    All other components within normal limits  BLOOD GAS, VENOUS - Abnormal; Notable for the following components:   pH, Ven 7.537 (*)    pCO2, Ven 28.2 (*)    Acid-Base Excess 2.6 (*)    All other components within normal limits  URINALYSIS, ROUTINE W REFLEX MICROSCOPIC - Abnormal; Notable for the following components:   Glucose, UA >=500 (*)    Hgb urine dipstick SMALL (*)    Ketones, ur 20 (*)    Protein, ur 100 (*)    All other components within normal limits  COMPREHENSIVE METABOLIC PANEL - Abnormal; Notable for the following components:   Sodium 132 (*)    Potassium 2.8 (*)    Chloride 92 (*)    CO2 21 (*)    Glucose, Bld 258 (*)    Total Protein 8.8 (*)    Total Bilirubin 1.9 (*)    Anion gap 19 (*)     All other components within normal limits  BETA-HYDROXYBUTYRIC ACID - Abnormal; Notable for the following components:   Beta-Hydroxybutyric Acid 0.77 (*)    All other components within normal limits  BASIC METABOLIC PANEL - Abnormal; Notable for the following components:   Sodium 133 (*)    Potassium 2.9 (*)  Chloride 93 (*)    Glucose, Bld 137 (*)    All other components within normal limits  CBG MONITORING, ED - Abnormal; Notable for the following components:   Glucose-Capillary 300 (*)    All other components within normal limits  CBG MONITORING, ED - Abnormal; Notable for the following components:   Glucose-Capillary 314 (*)    All other components within normal limits  CBG MONITORING, ED - Abnormal; Notable for the following components:   Glucose-Capillary 128 (*)    All other components within normal limits  RESP PANEL BY RT-PCR (FLU A&B, COVID) ARPGX2  LIPASE, BLOOD  MAGNESIUM  BASIC METABOLIC PANEL  BASIC METABOLIC PANEL  HEMOGLOBIN A1C  CBC  CREATININE, SERUM  COMPREHENSIVE METABOLIC PANEL  CBC  CBG MONITORING, ED  TROPONIN I (HIGH SENSITIVITY)  TROPONIN I (HIGH SENSITIVITY)    EKG None  Radiology DG Chest 2 View  Result Date: 05/05/2020 CLINICAL DATA:  Chest pain EXAM: CHEST - 2 VIEW COMPARISON:  08/31/2019 FINDINGS: The heart size and mediastinal contours are within normal limits. Both lungs are clear. The visualized skeletal structures are unremarkable. IMPRESSION: No acute abnormality of the lungs. Electronically Signed   By: Lauralyn Primes M.D.   On: 05/05/2020 17:36    Procedures .Critical Care Performed by: Gailen Shelter, PA Authorized by: Gailen Shelter, PA   Critical care provider statement:    Critical care time (minutes):  35   Critical care time was exclusive of:  Separately billable procedures and treating other patients and teaching time   Critical care was necessary to treat or prevent imminent or life-threatening deterioration of the  following conditions: Metabolic derangements including severe dehydration, electrolyte derangement.  Also severe tachycardia questionable DKA.   Critical care was time spent personally by me on the following activities:  Discussions with consultants, evaluation of patient's response to treatment, examination of patient, review of old charts, re-evaluation of patient's condition, pulse oximetry, ordering and review of radiographic studies, ordering and review of laboratory studies and ordering and performing treatments and interventions   I assumed direction of critical care for this patient from another provider in my specialty: no     (including critical care time)  Medications Ordered in ED Medications  lactated ringers infusion ( Intravenous New Bag/Given 05/05/20 2134)  potassium chloride 10 mEq in 100 mL IVPB (10 mEq Intravenous New Bag/Given 05/05/20 2142)  insulin glargine (LANTUS) injection 15 Units (has no administration in time range)  insulin aspart (novoLOG) injection 0-9 Units (has no administration in time range)  enoxaparin (LOVENOX) injection 40 mg (has no administration in time range)  acetaminophen (TYLENOL) tablet 650 mg (has no administration in time range)    Or  acetaminophen (TYLENOL) suppository 650 mg (has no administration in time range)  ondansetron (ZOFRAN) tablet 4 mg (has no administration in time range)    Or  ondansetron (ZOFRAN) injection 4 mg (has no administration in time range)  hydrALAZINE (APRESOLINE) injection 10 mg (has no administration in time range)  lactated ringers bolus 2,000 mL (0 mLs Intravenous Stopped 05/05/20 2039)  ondansetron (ZOFRAN) injection 4 mg (4 mg Intravenous Given 05/05/20 2044)  potassium chloride SA (KLOR-CON) CR tablet 40 mEq (40 mEq Oral Given 05/05/20 2149)    ED Course  I have reviewed the triage vital signs and the nursing notes.  Pertinent labs & imaging results that were available during my care of the patient were  reviewed by me and considered in my medical decision  making (see chart for details).    MDM Rules/Calculators/A&P                          Patient with history of type 1 diabetes and episodes of DKA in the past.  Here with alcohol indiscretion and with nausea vomiting abdominal pain fatigue and tachycardia appears very dry dehydrated  Nontender in the abdomen.  Suspect alcohol use as the cause of his dehydration and possible DKA today.  We will provide with 2 L of LR and obtain blood work and reevaluate.  CMP with potassium of 2.8 we will replete this IV n.p.o., CO2 21 mildly low with anion gap of 19.  Consistent with DKA. VBG is not consistent with DKA as he does not have acidosis.  CBC with significant leukocytosis of 70.9 this is similar to prior episodes when he has presented with severe dehydration and DKA.  Urinalysis without any evidence of infection.  Chest x-ray without any acute disease.  EKG is nonischemic. Beta hydroxybutyrate mildly elevated.  Patient does not appear to be in DKA currently although he is at high risk for this and could easily decompensate.  He is with significant dehydration and hypokalemia.  Will replete and admit to hospitalist.  I discontinue the patient's insulin pump at this time and ordered repeat CBG.  Discussed with Dr. Toniann Fail who will admit.  Final Clinical Impression(s) / ED Diagnoses Final diagnoses:  None    Rx / DC Orders ED Discharge Orders    None       Gailen Shelter, [place] 05/05/20 2242    Mancel Bale, MD 05/06/20 1106

## 2020-05-06 DIAGNOSIS — E1010 Type 1 diabetes mellitus with ketoacidosis without coma: Secondary | ICD-10-CM

## 2020-05-06 DIAGNOSIS — D72829 Elevated white blood cell count, unspecified: Secondary | ICD-10-CM | POA: Diagnosis not present

## 2020-05-06 DIAGNOSIS — E871 Hypo-osmolality and hyponatremia: Secondary | ICD-10-CM | POA: Diagnosis not present

## 2020-05-06 LAB — COMPREHENSIVE METABOLIC PANEL
ALT: 31 U/L (ref 0–44)
AST: 26 U/L (ref 15–41)
Albumin: 3.9 g/dL (ref 3.5–5.0)
Alkaline Phosphatase: 72 U/L (ref 38–126)
Anion gap: 7 (ref 5–15)
BUN: 13 mg/dL (ref 6–20)
CO2: 30 mmol/L (ref 22–32)
Calcium: 8.9 mg/dL (ref 8.9–10.3)
Chloride: 96 mmol/L — ABNORMAL LOW (ref 98–111)
Creatinine, Ser: 0.87 mg/dL (ref 0.61–1.24)
GFR, Estimated: >60 mL/min (ref >60)
Glucose, Bld: 197 mg/dL — ABNORMAL HIGH (ref 70–99)
Potassium: 4 mmol/L (ref 3.5–5.1)
Sodium: 133 mmol/L — ABNORMAL LOW (ref 135–145)
Total Bilirubin: 1.9 mg/dL — ABNORMAL HIGH (ref 0.3–1.2)
Total Protein: 7.1 g/dL (ref 6.5–8.1)

## 2020-05-06 LAB — CBC
HCT: 40.4 % (ref 39.0–52.0)
HCT: 46 % (ref 39.0–52.0)
Hemoglobin: 14.5 g/dL (ref 13.0–17.0)
Hemoglobin: 16.7 g/dL (ref 13.0–17.0)
MCH: 30.1 pg (ref 26.0–34.0)
MCH: 30.4 pg (ref 26.0–34.0)
MCHC: 35.9 g/dL (ref 30.0–36.0)
MCHC: 36.3 g/dL — ABNORMAL HIGH (ref 30.0–36.0)
MCV: 84 fL (ref 80.0–100.0)
MCV: 83.8 fL (ref 80.0–100.0)
Platelets: 247 10*3/uL (ref 150–400)
Platelets: 224 10*3/uL (ref 150–400)
RBC: 4.81 MIL/uL (ref 4.22–5.81)
RBC: 5.49 MIL/uL (ref 4.22–5.81)
RDW: 12.1 % (ref 11.5–15.5)
RDW: 12.1 % (ref 11.5–15.5)
WBC: 18.8 10*3/uL — ABNORMAL HIGH (ref 4.0–10.5)
WBC: 14.8 10*3/uL — ABNORMAL HIGH (ref 4.0–10.5)
nRBC: 0 % (ref 0.0–0.2)
nRBC: 0 % (ref 0.0–0.2)

## 2020-05-06 LAB — CBG MONITORING, ED
Glucose-Capillary: 193 mg/dL — ABNORMAL HIGH (ref 70–99)
Glucose-Capillary: 195 mg/dL — ABNORMAL HIGH (ref 70–99)
Glucose-Capillary: 218 mg/dL — ABNORMAL HIGH (ref 70–99)
Glucose-Capillary: 237 mg/dL — ABNORMAL HIGH (ref 70–99)
Glucose-Capillary: 238 mg/dL — ABNORMAL HIGH (ref 70–99)

## 2020-05-06 LAB — GLUCOSE, CAPILLARY
Glucose-Capillary: 201 mg/dL — ABNORMAL HIGH (ref 70–99)
Glucose-Capillary: 280 mg/dL — ABNORMAL HIGH (ref 70–99)

## 2020-05-06 LAB — BASIC METABOLIC PANEL
Anion gap: 13 (ref 5–15)
BUN: 13 mg/dL (ref 6–20)
CO2: 26 mmol/L (ref 22–32)
Calcium: 9.3 mg/dL (ref 8.9–10.3)
Chloride: 94 mmol/L — ABNORMAL LOW (ref 98–111)
Creatinine, Ser: 0.8 mg/dL (ref 0.61–1.24)
GFR, Estimated: >60 mL/min (ref >60)
Glucose, Bld: 222 mg/dL — ABNORMAL HIGH (ref 70–99)
Potassium: 4 mmol/L (ref 3.5–5.1)
Sodium: 133 mmol/L — ABNORMAL LOW (ref 135–145)

## 2020-05-06 LAB — HEMOGLOBIN A1C
Hgb A1c MFr Bld: 6.8 % — ABNORMAL HIGH (ref 4.8–5.6)
Mean Plasma Glucose: 148.46 mg/dL

## 2020-05-06 MED ORDER — INSULIN GLARGINE 100 UNIT/ML ~~LOC~~ SOLN
10.0000 [IU] | Freq: Once | SUBCUTANEOUS | Status: AC
  Administered 2020-05-06: 10 [IU] via SUBCUTANEOUS
  Filled 2020-05-06: qty 0.1

## 2020-05-06 MED ORDER — ONDANSETRON HCL 4 MG PO TABS: 4.0000 mg | ORAL_TABLET | Freq: Four times a day (QID) | ORAL | 0 refills | Status: DC | PRN

## 2020-05-06 NOTE — Plan of Care (Signed)
Patient discharged, and meets all care plan requirements.

## 2020-05-06 NOTE — Discharge Summary (Signed)
Physician Discharge Summary  Mason Mclaughlin ZOX:[id number] DOB: 02/01/1996 DOA: 05/05/2020  PCP: Mason QualeShariff, Afreen Idris, MD  Admit date: 05/05/2020 Discharge date: 05/06/2020  Admitted From: Home Disposition: Home  Recommendations for Outpatient Follow-up:  1. Follow up with PCP in 1 week 2. Follow up in ED if symptoms worsen or new appear   Home Health: No Equipment/Devices: None  Discharge Condition: Stable CODE STATUS: Full Diet recommendation: Carb modified  Brief/Interim Summary: 24 year old male with history of diabetes mellitus type 1 on insulin pump presented with intractable nausea, vomiting with epigastric discomfort.  On presentation, he was hyperglycemic with elevated anion gap for which he was started on insulin drip and IV fluids.  Subsequently, blood sugars have improved.  His symptoms have improved.  He is tolerating diet.  He will be discharged home later today if he tolerates his diet and insulin pump can be resumed upon discharge.  Outpatient follow-up with PCP/endocrinology.  Discharge Diagnoses:   Early DKA in a patient with history of diabetes mellitus type 1 on insulin pump -Presented with elevated anion gap and hyperglycemia with intractable nausea, vomiting with epigastric discomfort -Treated with IV fluids and insulin drip and subsequently switched to long-acting insulin after blood sugars improved. -His symptoms have improved.  He is tolerating diet.  He will be discharged home later today if he tolerates his diet and insulin pump can be resumed upon discharge.  Outpatient follow-up with PCP/endocrinology.  Hypokalemia -Improved  Hyponatremia -Treated with IV fluids.  Outpatient follow-up  Hypertension -Blood pressure elevated.  Patient needs to follow-up with PCP regarding initiation of antihypertensives  Leukocytosis -Probably reactive.  Improving. Discharge Instructions  Discharge Instructions    Diet Carb Modified   Complete by: As  directed    Increase activity slowly   Complete by: As directed      Allergies as of 05/06/2020   No Known Allergies     Medication List    TAKE these medications   insulin aspart 100 UNIT/ML injection Commonly known as: novoLOG Inject 60 Units into the skin daily. Max dose 60 units   ondansetron 4 MG tablet Commonly known as: ZOFRAN Take 1 tablet (4 mg total) by mouth every 6 (six) hours as needed for nausea.       Follow-up Information    Shariff, Charise KillianAfreen Idris, MD. Schedule an appointment as soon as possible for a visit in 1 week(s).   Specialty: Endocrinology Contact information: [address] [place] [place] [id number] [phone]              No Known Allergies  Consultations:  None   Procedures/Studies: DG Chest 2 View  Result Date: 05/05/2020 CLINICAL DATA:  Chest pain EXAM: CHEST - 2 VIEW COMPARISON:  08/31/2019 FINDINGS: The heart size and mediastinal contours are within normal limits. Both lungs are clear. The visualized skeletal structures are unremarkable. IMPRESSION: No acute abnormality of the lungs. Electronically Signed   By: Lauralyn PrimesAlex  Bibbey M.D.   On: 05/05/2020 17:36       Subjective: Patient seen and examined at bedside.  He feels better and thinks that he can go home today.  Denies current nausea or vomiting.  Denies current shortness of breath or abdominal pain.  Discharge Exam: Vitals:   05/06/20 0730 05/06/20 0808  BP: (!) 165/116 (!) 179/118  Pulse: 82 81  Resp: 16 16  Temp:  98.5 F (36.9 C)  SpO2: 97% 100%    General: Pt is alert, awake, not in acute distress Cardiovascular: rate  controlled, S1/S2 + Respiratory: bilateral decreased breath sounds at bases Abdominal: Soft, NT, ND, bowel sounds + Extremities: no edema, no cyanosis    The results of significant diagnostics from this hospitalization (including imaging, microbiology, ancillary and laboratory) are listed below for reference.     Microbiology: Recent  Results (from the past 240 hour(s))  Resp Panel by RT-PCR (Flu A&B, Covid) Nasopharyngeal Swab     Status: None   Collection Time: 05/05/20  8:50 PM   Specimen: Nasopharyngeal Swab; Nasopharyngeal(NP) swabs in vial transport medium  Result Value Ref Range Status   SARS Coronavirus 2 by RT PCR NEGATIVE NEGATIVE Final    Comment: (NOTE) SARS-CoV-2 target nucleic acids are NOT DETECTED.  The SARS-CoV-2 RNA is generally detectable in upper respiratory specimens during the acute phase of infection. The lowest concentration of SARS-CoV-2 viral copies this assay can detect is 138 copies/mL. A negative result does not preclude SARS-Cov-2 infection and should not be used as the sole basis for treatment or other patient management decisions. A negative result may occur with  improper specimen collection/handling, submission of specimen other than nasopharyngeal swab, presence of viral mutation(s) within the areas targeted by this assay, and inadequate number of viral copies(<138 copies/mL). A negative result must be combined with clinical observations, patient history, and epidemiological information. The expected result is Negative.  Fact Sheet for Patients:  BloggerCourse.com  Fact Sheet for Healthcare Providers:  SeriousBroker.it  This test is no t yet approved or cleared by the [place] FDA and  has been authorized for detection and/or diagnosis of SARS-CoV-2 by FDA under an Emergency Use Authorization (EUA). This EUA will remain  in effect (meaning this test can be used) for the duration of the COVID-19 declaration under Section 564(b)(1) of the Act, 21 U.S.C.section 360bbb-3(b)(1), unless the authorization is terminated  or revoked sooner.       Influenza A by PCR NEGATIVE NEGATIVE Final   Influenza B by PCR NEGATIVE NEGATIVE Final    Comment: (NOTE) The Xpert Xpress SARS-CoV-2/FLU/RSV plus assay is intended as an aid in the  diagnosis of influenza from Nasopharyngeal swab specimens and should not be used as a sole basis for treatment. Nasal washings and aspirates are unacceptable for Xpert Xpress SARS-CoV-2/FLU/RSV testing.  Fact Sheet for Patients: BloggerCourse.com  Fact Sheet for Healthcare Providers: SeriousBroker.it  This test is not yet approved or cleared by the [place] FDA and has been authorized for detection and/or diagnosis of SARS-CoV-2 by FDA under an Emergency Use Authorization (EUA). This EUA will remain in effect (meaning this test can be used) for the duration of the COVID-19 declaration under Section 564(b)(1) of the Act, 21 U.S.C. section 360bbb-3(b)(1), unless the authorization is terminated or revoked.  Performed at [facility], [address], [place], [place] [address]      Labs: BNP (last 3 results) No results for input(s): BNP in the last 8760 hours. Basic Metabolic Panel: Recent Labs  Lab 05/05/20 1652 05/05/20 2018 05/05/20 2055 05/06/20 0455 05/06/20 0844  NA 132*  --  133* 133* 133*  K 2.8*  --  2.9* 4.0 4.0  CL 92*  --  93* 96* 94*  CO2 21*  --  29 30 26   GLUCOSE 258*  --  137* 197* 222*  BUN 18  --  17 13 13   CREATININE 0.97  --  0.91 0.87 0.80  CALCIUM 9.6  --  9.1 8.9 9.3  MG  --  2.1  --   --   --  Liver Function Tests: Recent Labs  Lab 05/05/20 1652 05/06/20 0455  AST 35 26  ALT 40 31  ALKPHOS 83 72  BILITOT 1.9* 1.9*  PROT 8.8* 7.1  ALBUMIN 4.9 3.9   Recent Labs  Lab 05/05/20 1652  LIPASE 23   No results for input(s): AMMONIA in the last 168 hours. CBC: Recent Labs  Lab 05/05/20 1629 05/06/20 0455 05/06/20 0844  WBC 17.9* 18.8* 14.8*  HGB 16.5 14.5 16.7  HCT 46.0 40.4 46.0  MCV 82.7 84.0 83.8  PLT 278 247 224   Cardiac Enzymes: No results for input(s): CKTOTAL, CKMB, CKMBINDEX, TROPONINI in the last 168 hours. BNP: Invalid input(s):  POCBNP CBG: Recent Labs  Lab 05/06/20 0359 05/06/20 0607 05/06/20 0748 05/06/20 0952 05/06/20 1211  GLUCAP 193* 195* 238* 201* 280*   D-Dimer No results for input(s): DDIMER in the last 72 hours. Hgb A1c Recent Labs    05/06/20 0844  HGBA1C 6.8*   Lipid Profile No results for input(s): CHOL, HDL, LDLCALC, TRIG, CHOLHDL, LDLDIRECT in the last 72 hours. Thyroid function studies No results for input(s): TSH, T4TOTAL, T3FREE, THYROIDAB in the last 72 hours.  Invalid input(s): FREET3 Anemia work up No results for input(s): VITAMINB12, FOLATE, FERRITIN, TIBC, IRON, RETICCTPCT in the last 72 hours. Urinalysis    Component Value Date/Time   COLORURINE YELLOW 05/05/2020 2041   APPEARANCEUR CLEAR 05/05/2020 2041   LABSPEC 1.021 05/05/2020 2041   PHURINE 6.0 05/05/2020 2041   GLUCOSEU >=500 (A) 05/05/2020 2041   HGBUR SMALL (A) 05/05/2020 2041   BILIRUBINUR NEGATIVE 05/05/2020 2041   KETONESUR 20 (A) 05/05/2020 2041   PROTEINUR 100 (A) 05/05/2020 2041   NITRITE NEGATIVE 05/05/2020 2041   LEUKOCYTESUR NEGATIVE 05/05/2020 2041   Sepsis Labs Invalid input(s): PROCALCITONIN,  WBC,  LACTICIDVEN Microbiology Recent Results (from the past 240 hour(s))  Resp Panel by RT-PCR (Flu A&B, Covid) Nasopharyngeal Swab     Status: None   Collection Time: 05/05/20  8:50 PM   Specimen: Nasopharyngeal Swab; Nasopharyngeal(NP) swabs in vial transport medium  Result Value Ref Range Status   SARS Coronavirus 2 by RT PCR NEGATIVE NEGATIVE Final    Comment: (NOTE) SARS-CoV-2 target nucleic acids are NOT DETECTED.  The SARS-CoV-2 RNA is generally detectable in upper respiratory specimens during the acute phase of infection. The lowest concentration of SARS-CoV-2 viral copies this assay can detect is 138 copies/mL. A negative result does not preclude SARS-Cov-2 infection and should not be used as the sole basis for treatment or other patient management decisions. A negative result may occur with   improper specimen collection/handling, submission of specimen other than nasopharyngeal swab, presence of viral mutation(s) within the areas targeted by this assay, and inadequate number of viral copies(<138 copies/mL). A negative result must be combined with clinical observations, patient history, and epidemiological information. The expected result is Negative.  Fact Sheet for Patients:  BloggerCourse.com  Fact Sheet for Healthcare Providers:  SeriousBroker.it  This test is no t yet approved or cleared by the [place] FDA and  has been authorized for detection and/or diagnosis of SARS-CoV-2 by FDA under an Emergency Use Authorization (EUA). This EUA will remain  in effect (meaning this test can be used) for the duration of the COVID-19 declaration under Section 564(b)(1) of the Act, 21 U.S.C.section 360bbb-3(b)(1), unless the authorization is terminated  or revoked sooner.       Influenza A by PCR NEGATIVE NEGATIVE Final   Influenza B by PCR NEGATIVE NEGATIVE Final  Comment: (NOTE) The Xpert Xpress SARS-CoV-2/FLU/RSV plus assay is intended as an aid in the diagnosis of influenza from Nasopharyngeal swab specimens and should not be used as a sole basis for treatment. Nasal washings and aspirates are unacceptable for Xpert Xpress SARS-CoV-2/FLU/RSV testing.  Fact Sheet for Patients: BloggerCourse.com  Fact Sheet for Healthcare Providers: SeriousBroker.it  This test is not yet approved or cleared by the [place] FDA and has been authorized for detection and/or diagnosis of SARS-CoV-2 by FDA under an Emergency Use Authorization (EUA). This EUA will remain in effect (meaning this test can be used) for the duration of the COVID-19 declaration under Section 564(b)(1) of the Act, 21 U.S.C. section 360bbb-3(b)(1), unless the authorization is terminated  or revoked.  Performed at [facility], [address] [place], [place] [address]      Time coordinating discharge: 35 minutes  SIGNED:   Glade Lloyd, MD  [facility] 05/06/2020, 12:14 PM

## 2020-05-06 NOTE — ED Notes (Signed)
Report called to RN Herman  

## 2020-05-06 NOTE — ED Notes (Signed)
Delsa Grana, RN notified of pt's blood pressure.

## 2020-05-06 NOTE — Progress Notes (Addendum)
Inpatient Diabetes Program Recommendations  AACE/ADA: New Consensus Statement on Inpatient Glycemic Control (2015)  Target Ranges:  Prepandial:   less than 140 mg/dL      Peak postprandial:   less than 180 mg/dL (1-2 hours)      Critically ill patients:  140 - 180 mg/dL   Results for Mason Mclaughlin, Mason Mclaughlin (MRN [id number]) as of 05/06/2020 09:34  Ref. Range 05/05/2020 16:52  Sodium Latest Ref Range: 135 - 145 mmol/L 132 (L)  Potassium Latest Ref Range: 3.5 - 5.1 mmol/L 2.8 (L)  Chloride Latest Ref Range: 98 - 111 mmol/L 92 (L)  CO2 Latest Ref Range: 22 - 32 mmol/L 21 (L)  Glucose Latest Ref Range: 70 - 99 mg/dL 737 (H)  BUN Latest Ref Range: 6 - 20 mg/dL 18  Creatinine Latest Ref Range: 0.61 - 1.24 mg/dL 1.06  Calcium Latest Ref Range: 8.9 - 10.3 mg/dL 9.6  Anion gap Latest Ref Range: 5 - 15  19 (H)   Results for Mason Mclaughlin, Mason Mclaughlin (MRN [id number]) as of 05/06/2020 09:34  Ref. Range 05/05/2020 16:21 05/05/2020 17:00 05/05/2020 20:43 05/05/2020 23:06 05/06/2020 00:21 05/06/2020 02:26 05/06/2020 03:59 05/06/2020 06:07 05/06/2020 07:48  Glucose-Capillary Latest Ref Range: 70 - 99 mg/dL 703 (H) 500 (H) 938 (H) 213 (H)  3 units NOVOLOG  218 (H) 237 (H)  3 units NOVOLOG  193 (H) 195 (H)  2 units NOVOLOG  238 (H)    Admit with: Early DKA  History: Type 1 Diabetes  Home DM Meds: Insulin Pump  Current Orders: Lantus 15 units QHS      Novolog Sensitive Correction Scale/ SSI (0-9 units) Q4 hours    Endocrinologist: Dr. Carney Living with [facility]--Last seen 12/18/2019 Basal Rates: 12am- 1.3 units/hr 6:30am- 1.5 units/hr 12pm- 1.4 units/hr 11pm- 1.15 units/hr Total Basal per 24 hour period= 33.25 units  Correction Factor: 1 unit for every 30 mg/dl above Target CBG Carbohydrate Ratio: 1 unit for every 6.5 grams Carbs Target CBG 110 mg/dl   Spoke w/ Dr. Hanley Ben by phone about this patient.  Concern that pt not due to get Lantus until bedtime tonight.  If plan is to d/c pt after lunch  today, recommend we give pt a small amount of Lantus to bridge his basal needs until he goes home and can resume his insulin pump.  Plan to give pt 10 units Lantus X 1 dose this AM and then d/c home.  Pt to resume insulin pump when he gets home with all new supplies (this was explained to pt earlier).  If for some reason pt not discharged home tonight, please make sure to order Lantus for tonight.  Lantus 20 units QHS.    --Will follow patient during hospitalization--  Ambrose Finland RN, MSN, CDE Diabetes Coordinator Inpatient Glycemic Control Team Team Pager: [phone] (8a-5p)

## 2020-05-06 NOTE — Progress Notes (Signed)
Patient was given all discharge instructions. Patient given instructions on Zofran use, and last time it was given. Patient educated on follow up appointment instructions. IV taken out. Patient wheeled out to private vehicle.

## 2020-06-18 IMAGING — DX DG CHEST 1V PORT
1 series · 1 of 1 positions shown · non-contrast
Comparison: None.

CLINICAL DATA: Nausea, vomiting.  Chest pain

EXAM:
PORTABLE CHEST 1 VIEW

[chest ap]
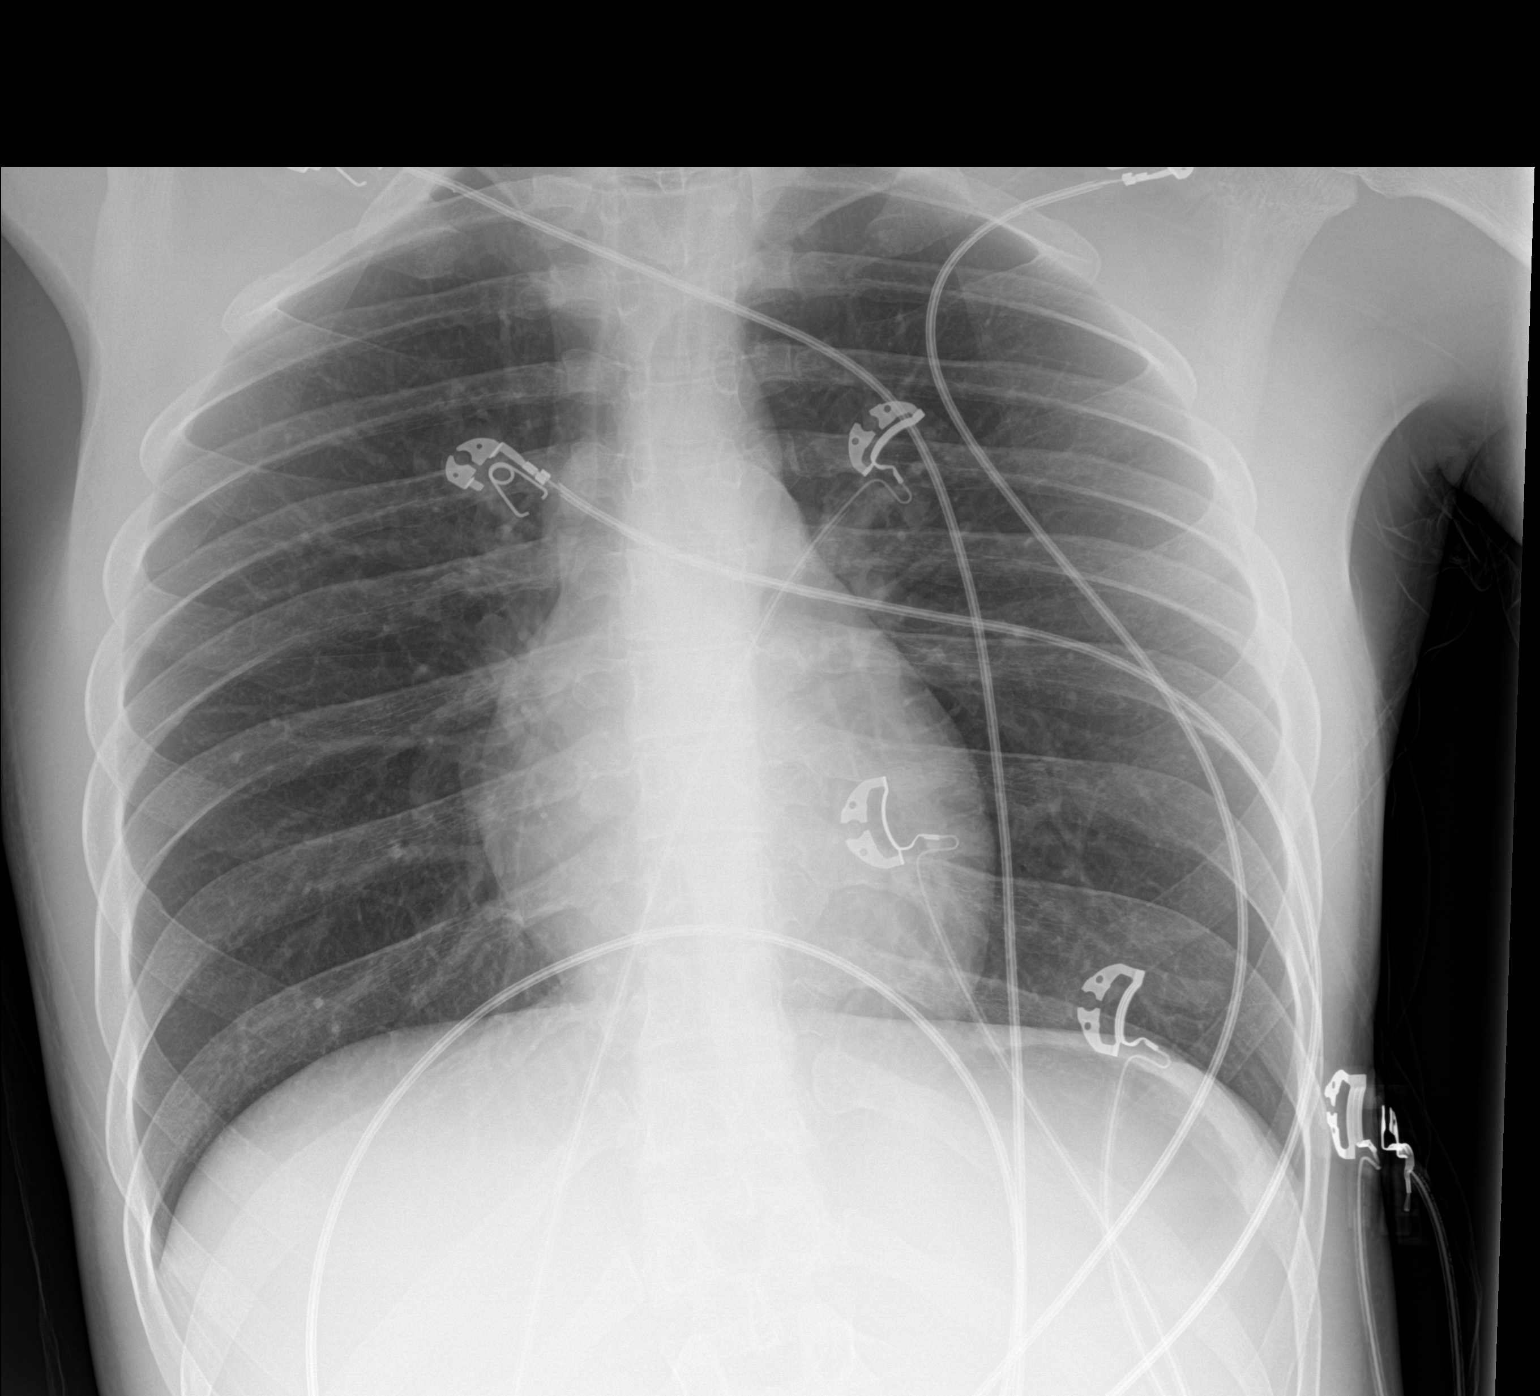

[1 of 1 positions shown; findings below may reference images not displayed]

FINDINGS: Heart and mediastinal contours are within normal limits. No focal
opacities or effusions. No acute bony abnormality.
IMPRESSION: No active disease.

## 2020-06-20 IMAGING — DX DG CHEST 1V PORT
1 series · 1 of 1 positions shown · non-contrast
Comparison: Portable chest x-ray of 03/14/2018

CLINICAL DATA: Respiratory failure, follow-up

EXAM:
PORTABLE CHEST 1 VIEW

[chest ap]
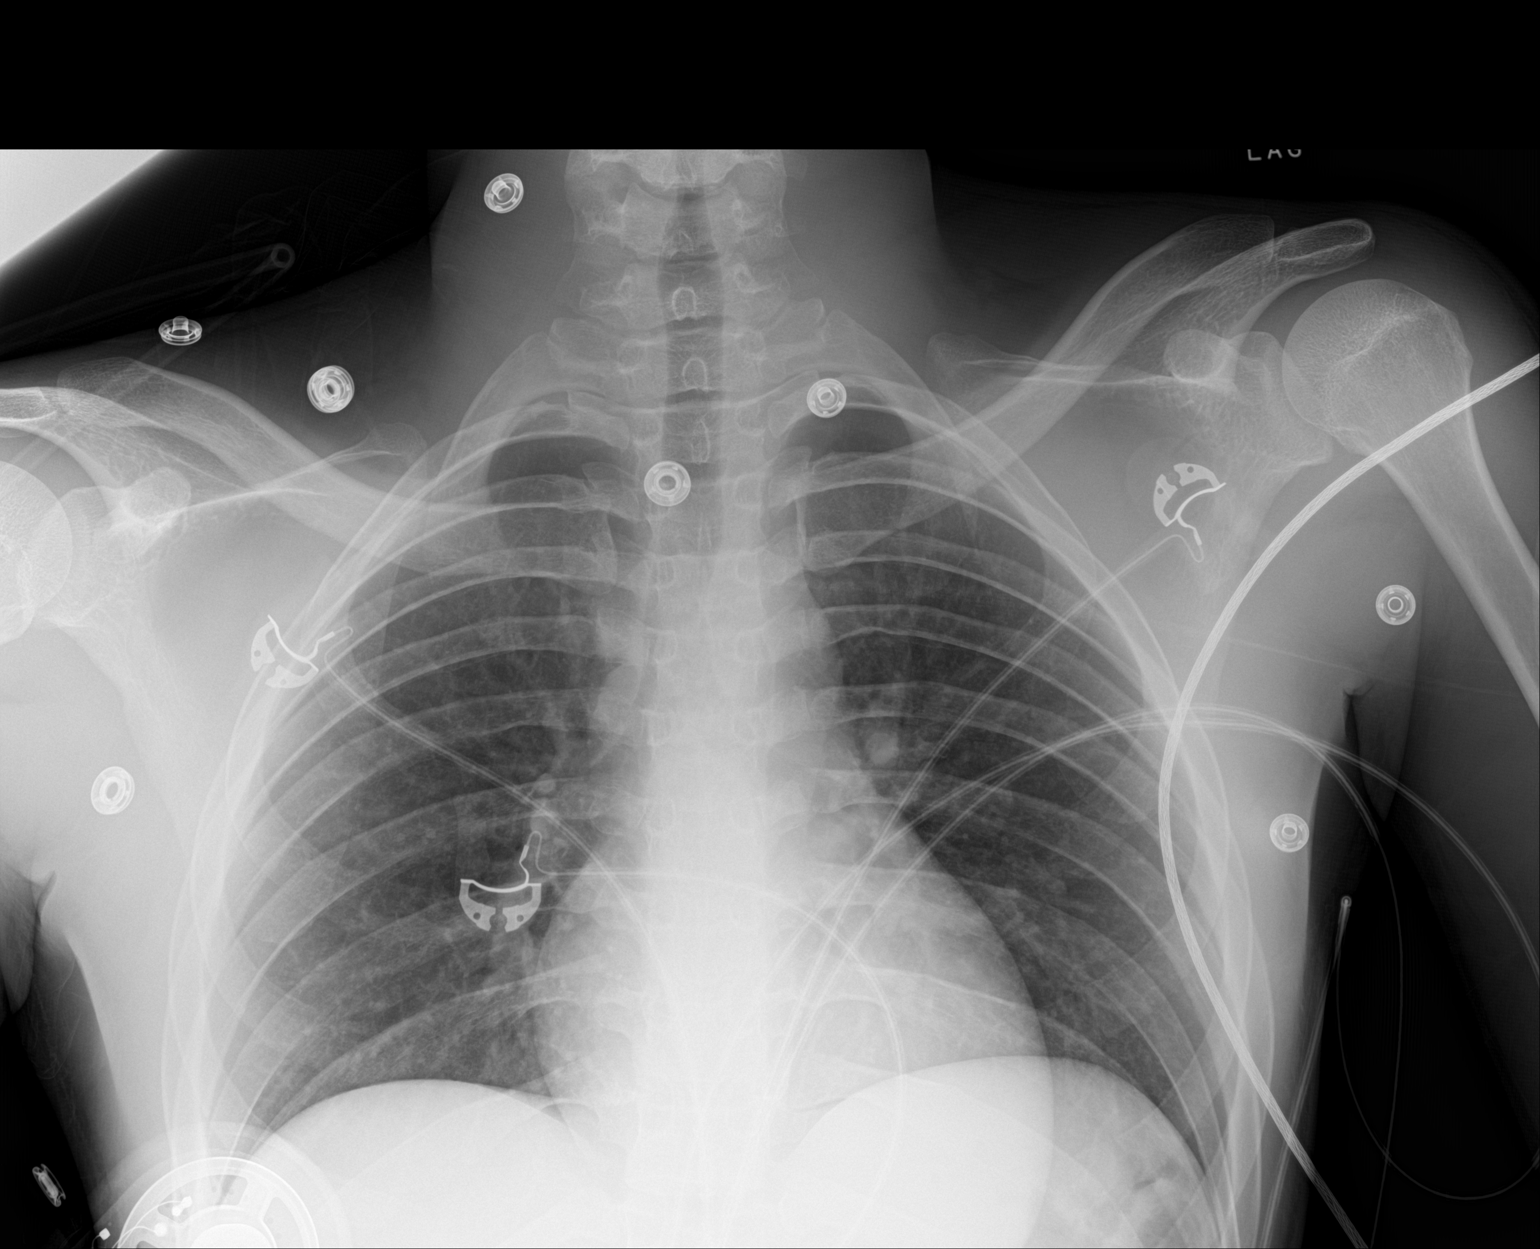

[1 of 1 positions shown; findings below may reference images not displayed]

FINDINGS: No active infiltrate or effusion is seen. Mediastinal and hilar
contours are unremarkable. The heart is within normal limits in
size. No bony abnormality is seen.
IMPRESSION: No active disease.

## 2020-09-10 ENCOUNTER — Other Ambulatory Visit: Payer: Self-pay

## 2020-09-10 ENCOUNTER — Emergency Department (HOSPITAL_COMMUNITY)
Admission: EM | Admit: 2020-09-10 | Discharge: 2020-09-10 | Disposition: A | Discharge status: Home / Self Care | Payer: BC Managed Care – PPO | Attending: Emergency Medicine | Admitting: Emergency Medicine

## 2020-09-10 ENCOUNTER — Encounter (HOSPITAL_COMMUNITY): Payer: Self-pay

## 2020-09-10 DIAGNOSIS — Z8639 Personal history of other endocrine, nutritional and metabolic disease: Secondary | ICD-10-CM

## 2020-09-10 DIAGNOSIS — Z794 Long term (current) use of insulin: Secondary | ICD-10-CM | POA: Insufficient documentation

## 2020-09-10 DIAGNOSIS — R112 Nausea with vomiting, unspecified: Secondary | ICD-10-CM

## 2020-09-10 DIAGNOSIS — R1084 Generalized abdominal pain: Secondary | ICD-10-CM | POA: Diagnosis not present

## 2020-09-10 DIAGNOSIS — E1010 Type 1 diabetes mellitus with ketoacidosis without coma: Secondary | ICD-10-CM | POA: Insufficient documentation

## 2020-09-10 DIAGNOSIS — I10 Essential (primary) hypertension: Secondary | ICD-10-CM | POA: Diagnosis not present

## 2020-09-10 LAB — CBC WITH DIFFERENTIAL/PLATELET
Abs Immature Granulocytes: 0.06 10*3/uL (ref 0.00–0.07)
Basophils Absolute: 0 10*3/uL (ref 0.0–0.1)
Basophils Relative: 0 %
Eosinophils Absolute: 0 10*3/uL (ref 0.0–0.5)
Eosinophils Relative: 0 %
HCT: 44.7 % (ref 39.0–52.0)
Hemoglobin: 15.8 g/dL (ref 13.0–17.0)
Immature Granulocytes: 0 %
Lymphocytes Relative: 7 %
Lymphs Abs: 1 10*3/uL (ref 0.7–4.0)
MCH: 29.9 pg (ref 26.0–34.0)
MCHC: 35.3 g/dL (ref 30.0–36.0)
MCV: 84.7 fL (ref 80.0–100.0)
Monocytes Absolute: 0.2 10*3/uL (ref 0.1–1.0)
Monocytes Relative: 1 %
Neutro Abs: 13.2 10*3/uL — ABNORMAL HIGH (ref 1.7–7.7)
Neutrophils Relative %: 92 %
Platelets: 297 10*3/uL (ref 150–400)
RBC: 5.28 MIL/uL (ref 4.22–5.81)
RDW: 12.6 % (ref 11.5–15.5)
WBC: 14.5 10*3/uL — ABNORMAL HIGH (ref 4.0–10.5)
nRBC: 0 % (ref 0.0–0.2)

## 2020-09-10 LAB — COMPREHENSIVE METABOLIC PANEL
ALT: 24 U/L (ref 0–44)
AST: 29 U/L (ref 15–41)
Albumin: 5.4 g/dL — ABNORMAL HIGH (ref 3.5–5.0)
Alkaline Phosphatase: 101 U/L (ref 38–126)
Anion gap: 13 (ref 5–15)
BUN: 14 mg/dL (ref 6–20)
CO2: 24 mmol/L (ref 22–32)
Calcium: 10.2 mg/dL (ref 8.9–10.3)
Chloride: 100 mmol/L (ref 98–111)
Creatinine, Ser: 1.03 mg/dL (ref 0.61–1.24)
GFR, Estimated: >60 mL/min (ref >60)
Glucose, Bld: 295 mg/dL — ABNORMAL HIGH (ref 70–99)
Potassium: 5.3 mmol/L — ABNORMAL HIGH (ref 3.5–5.1)
Sodium: 137 mmol/L (ref 135–145)
Total Bilirubin: 0.6 mg/dL (ref 0.3–1.2)
Total Protein: 9.5 g/dL — ABNORMAL HIGH (ref 6.5–8.1)

## 2020-09-10 LAB — BLOOD GAS, VENOUS
Acid-base deficit: 12.6 mmol/L — ABNORMAL HIGH (ref 0.0–2.0)
Acid-base deficit: 0.1 mmol/L (ref 0.0–2.0)
Bicarbonate: 10.9 mmol/L — ABNORMAL LOW (ref 20.0–28.0)
Bicarbonate: 25.2 mmol/L (ref 20.0–28.0)
O2 Saturation: 63.4 %
O2 Saturation: 61.9 %
Patient temperature: 98.6
Patient temperature: 98.6
pCO2, Ven: 18.1 mmHg — CL (ref 44.0–60.0)
pCO2, Ven: 45.6 mmHg (ref 44.0–60.0)
pH, Ven: 7.397 (ref 7.250–7.430)
pH, Ven: 7.362 (ref 7.250–7.430)
pO2, Ven: 35 mmHg (ref 32.0–45.0)
pO2, Ven: 36.3 mmHg (ref 32.0–45.0)

## 2020-09-10 LAB — CBG MONITORING, ED: Glucose-Capillary: 269 mg/dL — ABNORMAL HIGH (ref 70–99)

## 2020-09-10 MED ORDER — ONDANSETRON HCL 4 MG/2ML IJ SOLN
4.0000 mg | Freq: Once | INTRAMUSCULAR | Status: AC
  Administered 2020-09-10: 4 mg via INTRAVENOUS
  Filled 2020-09-10: qty 2

## 2020-09-10 MED ORDER — SODIUM CHLORIDE 0.9 % IV BOLUS
1000.0000 mL | Freq: Once | INTRAVENOUS | Status: AC
  Administered 2020-09-10: 1000 mL via INTRAVENOUS

## 2020-09-10 NOTE — ED Triage Notes (Addendum)
Pt reports generalized abdominal pains and vomiting since Sunday. Felt better Monday and then pains returned yesterday afternoon. Type 1 diabetic.

## 2020-09-10 NOTE — Discharge Instructions (Addendum)
Take Zofran for nausea as needed. Follow up with your doctor for recheck in 2-3 days if symptoms persist.   It is important to check your blood sugar frequently while sick. If it becomes significantly elevated and nausea/vomiting continue, please return to the ED for further evaluation immediately.

## 2020-09-10 NOTE — ED Notes (Signed)
Pt verbalized understanding of d/c and follow up care. Ambulatory with steady gait.  

## 2020-09-10 NOTE — ED Provider Notes (Signed)
[facility]-EMERGENCY DEPT Provider Note   CSN: [id number] Arrival date & time: 09/10/20  5643     History Chief Complaint  Patient presents with  . Vomiting    Mason Mclaughlin is a 25 y.o. male.  Patient with history of T1DM presents with abdominal pain that is generalized, nausea and vomiting that started 3 days ago. His symptoms lasted one day, resolved and returned yesterday. No hematemesis, diarrhea or fever. He reports taking his insulin as prescribed.   The history is provided by the patient. No language interpreter was used.       Past Medical History:  Diagnosis Date  . Diabetes (HCC)   . DKA (diabetic ketoacidoses)   . DKA, type 1 (HCC)   . Insulin pump in place     Patient Active Problem List   Diagnosis Date Noted  . DKA (diabetic ketoacidosis) (HCC) 05/05/2020  . Nausea & vomiting 05/05/2020  . Hyperkalemia 11/23/2019  . Leukocytosis 11/23/2019  . Hypertension complicating diabetes (HCC) 11/23/2019  . Type 1 diabetes mellitus with ketoacidosis without coma (HCC)   . Hyperglycemia 07/06/2019  . Elevated blood pressure reading 07/06/2019  . ARF (acute renal failure) (HCC) 04/17/2018  . AKI (acute kidney injury) (HCC)   . Nausea and vomiting   . DKA (diabetic ketoacidoses) 05/29/2017    History reviewed. No pertinent surgical history.     Family History  Problem Relation Age of Onset  . Healthy Mother   . Diabetes Father     Social History   Tobacco Use  . Smoking status: Never Smoker  . Smokeless tobacco: Never Used  Vaping Use  . Vaping Use: Never used  Substance Use Topics  . Alcohol use: Yes  . Drug use: Yes    Frequency: 4.0 times per week    Types: Marijuana    Home Medications Prior to Admission medications   Medication Sig Start Date End Date Taking? Authorizing Provider  insulin aspart (NOVOLOG) 100 UNIT/ML injection Inject 60 Units into the skin daily. Max dose 60 units 11/13/19   [provider]  ondansetron (ZOFRAN) 4 MG tablet Take 1 tablet (4 mg total) by mouth every 6 (six) hours as needed for nausea. 05/06/20   Glade Lloyd, MD    Allergies    Patient has no known allergies.  Review of Systems   Review of Systems  Constitutional: Negative for chills and fever.  HENT: Negative.   Respiratory: Negative.   Cardiovascular: Negative.   Gastrointestinal: Positive for abdominal pain, nausea and vomiting. Negative for diarrhea.  Musculoskeletal: Negative.   Skin: Negative.   Neurological: Negative.     Physical Exam Updated Vital Signs BP (!) 178/114 (BP Location: Left Arm)   Pulse 93   Temp 98.3 F (36.8 C) (Oral)   Resp 18   Ht 5\' 5"  (1.651 m)   Wt 63.5 kg   SpO2 100%   BMI 23.30 kg/m   Physical Exam Vitals and nursing note reviewed.  Constitutional:      Appearance: He is well-developed.  HENT:     Head: Normocephalic.  Cardiovascular:     Rate and Rhythm: Normal rate and regular rhythm.  Pulmonary:     Effort: Pulmonary effort is normal.     Breath sounds: Normal breath sounds.  Abdominal:     General: Bowel sounds are normal. There is no distension.     Palpations: Abdomen is soft.     Tenderness: There is no abdominal tenderness. There is  no guarding or rebound.  Musculoskeletal:        General: Normal range of motion.     Cervical back: Normal range of motion and neck supple.  Skin:    General: Skin is warm and dry.     Findings: No rash.  Neurological:     Mental Status: He is alert and oriented to person, place, and time.     ED Results / Procedures / Treatments   Labs (all labs ordered are listed, but only abnormal results are displayed) Labs Reviewed - No data to display  EKG None  Radiology No results found.  Procedures Procedures   Medications Ordered in ED Medications  sodium chloride 0.9 % bolus 1,000 mL (has no administration in time range)  ondansetron (ZOFRAN) injection 4 mg (has no administration in time range)     ED Course  I have reviewed the triage vital signs and the nursing notes.  Pertinent labs & imaging results that were available during my care of the patient were reviewed by me and considered in my medical decision making (see chart for details).    MDM Rules/Calculators/A&P                          Patient with h/o T1DM presents with nausea and vomiting with abdominal pain that is generalized. No fever or diarrhea.  The patient appears uncomfortable, holding emesis bag. Abdominal exam is benign without focal tenderness. Nausea addressed with Zofran and fluids provided.   On recheck, he is drinking water. He reports nausea is improved.  Blood pressure is elevated but he has not taken his morning medication. He is encouraged to take this as prescribed.   No DKA evident. No further vomiting. Nausea resolved. He is felt appropriate for discharge home. Return precautions discussed.   Final Clinical Impression(s) / ED Diagnoses Final diagnoses:  None   1. Nausea and vomiting 2. H/o T1DM  Rx / DC Orders ED Discharge Orders    None       Danne Harbor 09/10/20 0551    Dione Booze, MD 09/10/20 [phone]

## 2020-09-11 ENCOUNTER — Encounter (HOSPITAL_COMMUNITY): Payer: Self-pay

## 2020-09-11 ENCOUNTER — Emergency Department (HOSPITAL_COMMUNITY)
Admission: EM | Admit: 2020-09-11 | Discharge: 2020-09-11 | Disposition: A | Discharge status: Home / Self Care | Payer: BC Managed Care – PPO | Attending: Emergency Medicine | Admitting: Emergency Medicine

## 2020-09-11 ENCOUNTER — Other Ambulatory Visit: Payer: Self-pay

## 2020-09-11 ENCOUNTER — Emergency Department (HOSPITAL_COMMUNITY): Payer: BC Managed Care – PPO

## 2020-09-11 DIAGNOSIS — M545 Low back pain, unspecified: Secondary | ICD-10-CM | POA: Insufficient documentation

## 2020-09-11 DIAGNOSIS — R109 Unspecified abdominal pain: Secondary | ICD-10-CM | POA: Insufficient documentation

## 2020-09-11 DIAGNOSIS — Z794 Long term (current) use of insulin: Secondary | ICD-10-CM | POA: Insufficient documentation

## 2020-09-11 DIAGNOSIS — R112 Nausea with vomiting, unspecified: Secondary | ICD-10-CM | POA: Insufficient documentation

## 2020-09-11 DIAGNOSIS — E1010 Type 1 diabetes mellitus with ketoacidosis without coma: Secondary | ICD-10-CM | POA: Insufficient documentation

## 2020-09-11 LAB — CBC
HCT: 44.9 % (ref 39.0–52.0)
Hemoglobin: 15.9 g/dL (ref 13.0–17.0)
MCH: 29.7 pg (ref 26.0–34.0)
MCHC: 35.4 g/dL (ref 30.0–36.0)
MCV: 83.8 fL (ref 80.0–100.0)
Platelets: 290 10*3/uL (ref 150–400)
RBC: 5.36 MIL/uL (ref 4.22–5.81)
RDW: 12.3 % (ref 11.5–15.5)
WBC: 16.1 10*3/uL — ABNORMAL HIGH (ref 4.0–10.5)
nRBC: 0 % (ref 0.0–0.2)

## 2020-09-11 LAB — URINALYSIS, ROUTINE W REFLEX MICROSCOPIC
Bacteria, UA: NONE SEEN
Bilirubin Urine: NEGATIVE
Glucose, UA: NEGATIVE mg/dL
Ketones, ur: 20 mg/dL — AB
Leukocytes,Ua: NEGATIVE
Nitrite: NEGATIVE
Protein, ur: 30 mg/dL — AB
Specific Gravity, Urine: 1.008 (ref 1.005–1.030)
pH: 7 (ref 5.0–8.0)

## 2020-09-11 LAB — COMPREHENSIVE METABOLIC PANEL
ALT: 26 U/L (ref 0–44)
AST: 31 U/L (ref 15–41)
Albumin: 5.1 g/dL — ABNORMAL HIGH (ref 3.5–5.0)
Alkaline Phosphatase: 83 U/L (ref 38–126)
Anion gap: 13 (ref 5–15)
BUN: 13 mg/dL (ref 6–20)
CO2: 27 mmol/L (ref 22–32)
Calcium: 9.7 mg/dL (ref 8.9–10.3)
Chloride: 94 mmol/L — ABNORMAL LOW (ref 98–111)
Creatinine, Ser: 1.05 mg/dL (ref 0.61–1.24)
GFR, Estimated: >60 mL/min (ref >60)
Glucose, Bld: 164 mg/dL — ABNORMAL HIGH (ref 70–99)
Potassium: 3.3 mmol/L — ABNORMAL LOW (ref 3.5–5.1)
Sodium: 134 mmol/L — ABNORMAL LOW (ref 135–145)
Total Bilirubin: 1.4 mg/dL — ABNORMAL HIGH (ref 0.3–1.2)
Total Protein: 9 g/dL — ABNORMAL HIGH (ref 6.5–8.1)

## 2020-09-11 LAB — LIPASE, BLOOD: Lipase: 24 U/L (ref 11–51)

## 2020-09-11 LAB — CBG MONITORING, ED: Glucose-Capillary: 168 mg/dL — ABNORMAL HIGH (ref 70–99)

## 2020-09-11 MED ORDER — ONDANSETRON HCL 4 MG/2ML IJ SOLN
4.0000 mg | Freq: Once | INTRAMUSCULAR | Status: AC
  Administered 2020-09-11: 4 mg via INTRAVENOUS
  Filled 2020-09-11: qty 2

## 2020-09-11 MED ORDER — SODIUM CHLORIDE 0.9 % IV BOLUS
1000.0000 mL | Freq: Once | INTRAVENOUS | Status: AC
  Administered 2020-09-11: 1000 mL via INTRAVENOUS

## 2020-09-11 MED ORDER — HALOPERIDOL LACTATE 5 MG/ML IJ SOLN
5.0000 mg | Freq: Once | INTRAMUSCULAR | Status: AC
  Administered 2020-09-11: 5 mg via INTRAVENOUS
  Filled 2020-09-11: qty 1

## 2020-09-11 NOTE — ED Triage Notes (Signed)
Patient c/o abdominal pain and vomiting x 4 days.

## 2020-09-11 NOTE — Discharge Instructions (Signed)
Your symptoms may be due to marijuana use.  Please avoid marijuana as it may worsen your symptoms.  There is evidence of blood in your urine.  Please have your urine rechecked in the next week or 2 but if it shows blood in the urine again you may need to follow-up with a urologist for further evaluation.

## 2020-09-11 NOTE — ED Provider Notes (Signed)
MSE was initiated and I personally evaluated the patient and placed orders (if any) at  5:05 PM on September 11, 2020.  The patient appears stable so that the remainder of the MSE may be completed by another provider.  Patient placed in Quick Look pathway, seen and evaluated   Chief Complaint: abd pain, nausea/vomiting  HPI:   25 y.o. M who presents for evaluation of generalized abdominal pain, nausea/vomiting this been ongoing for last 4 days.  No fevers, diarrhea.  No urinary complaints.  History of type 1 diabetes states he has not missed any insulin doses.  ROS: Abdominal pain, nausea/vomiting   Physical Exam:   Gen: No distress  Neuro: Awake and Alert  Skin: Warm    Focused Exam: Abdomen soft, nondistended.  Diffuse tenderness with no focal point.  No rigidity, guarding.   Initiation of care has begun. The patient has been counseled on the process, plan, and necessity for staying for the completion/evaluation, and the remainder of the medical screening examination    Rosana Hoes 09/11/20 1706    Milagros Loll, MD 09/11/20 [phone]

## 2020-09-11 NOTE — ED Provider Notes (Signed)
[facility]-EMERGENCY DEPT Provider Note   CSN: [id number] Arrival date & time: 09/11/20  1641     History Chief Complaint  Patient presents with  . Abdominal Pain  . Emesis    Mason Mclaughlin is a 25 y.o. male.  The history is provided by the patient and medical records. No language interpreter was used.  Abdominal Pain Associated symptoms: vomiting   Emesis Associated symptoms: abdominal pain      25 year old male significant history of type 1 diabetes, history of marijuana use presenting for evaluation of nausea and vomiting.  Patient report for the last 3 to 4 days he has been having persistent nausea, has multiple episodes of nonbloody nonbilious vomiting, and having some achiness in his lower back and mild abdominal discomfort.  Symptoms not adequately controlled despite taking Zofran at home.  He noted some improvement with taking hot shower.  He denies having fever or chills no chest pain or shortness of breath no diarrhea constipation no dysuria.  He admits to marijuana use.  He mention his diabetes is well controlled as he has an insulin pump.  He denies any recent sick contact.  He has been fully vaccinated for COVID-19.  Past Medical History:  Diagnosis Date  . Diabetes (HCC)   . DKA (diabetic ketoacidoses)   . DKA, type 1 (HCC)   . Insulin pump in place     Patient Active Problem List   Diagnosis Date Noted  . DKA (diabetic ketoacidosis) (HCC) 05/05/2020  . Nausea & vomiting 05/05/2020  . Hyperkalemia 11/23/2019  . Leukocytosis 11/23/2019  . Hypertension complicating diabetes (HCC) 11/23/2019  . Type 1 diabetes mellitus with ketoacidosis without coma (HCC)   . Hyperglycemia 07/06/2019  . Elevated blood pressure reading 07/06/2019  . ARF (acute renal failure) (HCC) 04/17/2018  . AKI (acute kidney injury) (HCC)   . Nausea and vomiting   . DKA (diabetic ketoacidoses) 05/29/2017    History reviewed. No pertinent surgical  history.     Family History  Problem Relation Age of Onset  . Healthy Mother   . Diabetes Father     Social History   Tobacco Use  . Smoking status: Never Smoker  . Smokeless tobacco: Never Used  Vaping Use  . Vaping Use: Never used  Substance Use Topics  . Alcohol use: Yes  . Drug use: Yes    Frequency: 4.0 times per week    Types: Marijuana    Home Medications Prior to Admission medications   Medication Sig Start Date End Date Taking? Authorizing Provider  insulin aspart (NOVOLOG) 100 UNIT/ML injection Inject 60 Units into the skin daily. Max dose 60 units 11/13/19   [provider]    Allergies    Patient has no known allergies.  Review of Systems   Review of Systems  Gastrointestinal: Positive for abdominal pain and vomiting.  All other systems reviewed and are negative.   Physical Exam Updated Vital Signs BP (!) 189/119 (BP Location: Right Arm)   Pulse 93   Temp 98.8 F (37.1 C) (Oral)   Resp 16   Ht 5\' 5"  (1.651 m)   Wt 63.5 kg   SpO2 100%   BMI 23.30 kg/m   Physical Exam Vitals and nursing note reviewed.  Constitutional:      General: He is not in acute distress.    Appearance: He is well-developed.  HENT:     Head: Atraumatic.  Eyes:     Conjunctiva/sclera: Conjunctivae normal.  Cardiovascular:  Rate and Rhythm: Normal rate and regular rhythm.     Heart sounds: Normal heart sounds.  Pulmonary:     Effort: Pulmonary effort is normal.     Breath sounds: Normal breath sounds. No wheezing, rhonchi or rales.  Abdominal:     General: Abdomen is flat.     Palpations: Abdomen is soft.     Tenderness: There is no abdominal tenderness.     Hernia: No hernia is present.  Musculoskeletal:     Cervical back: Neck supple.  Skin:    Findings: No rash.  Neurological:     Mental Status: He is alert and oriented to person, place, and time.  Psychiatric:        Mood and Affect: Mood normal.     ED Results / Procedures / Treatments    Labs (all labs ordered are listed, but only abnormal results are displayed) Labs Reviewed  COMPREHENSIVE METABOLIC PANEL - Abnormal; Notable for the following components:      Result Value   Sodium 134 (*)    Potassium 3.3 (*)    Chloride 94 (*)    Glucose, Bld 164 (*)    Total Protein 9.0 (*)    Albumin 5.1 (*)    Total Bilirubin 1.4 (*)    All other components within normal limits  CBC - Abnormal; Notable for the following components:   WBC 16.1 (*)    All other components within normal limits  URINALYSIS, ROUTINE W REFLEX MICROSCOPIC - Abnormal; Notable for the following components:   Hgb urine dipstick MODERATE (*)    Ketones, ur 20 (*)    Protein, ur 30 (*)    All other components within normal limits  CBG MONITORING, ED - Abnormal; Notable for the following components:   Glucose-Capillary 168 (*)    All other components within normal limits  LIPASE, BLOOD    EKG EKG Interpretation  Date/Time:  Thursday September 11 2020 18:29:44 EDT Ventricular Rate:  92 PR Interval:    QRS Duration: 86 QT Interval:  377 QTC Calculation: 467 R Axis:   75 Text Interpretation: SR . no sig change from previous Confirmed by Arby Barrette [id number]) on 09/11/2020 7:04:53 PM     Radiology CT Renal Stone Study  Result Date: 09/11/2020 CLINICAL DATA:  Abdominal pain, vomiting and hematuria. EXAM: CT ABDOMEN AND PELVIS WITHOUT CONTRAST TECHNIQUE: Multidetector CT imaging of the abdomen and pelvis was performed following the standard protocol without IV contrast. COMPARISON:  None. FINDINGS: Limited examination without IV and oral contrast this young male with no intraperitoneal fat. Lower chest: Insert lung bases Hepatobiliary: No hepatic lesions are identified without contrast. No intrahepatic biliary dilatation. The gallbladder is grossly normal. No common bile duct dilatation. Pancreas: No mass, inflammation or ductal dilatation. Spleen: Normal size.  No focal lesions. Adrenals/Urinary Tract:  The adrenal glands are grossly normal. No renal, ureteral or bladder calculi. Stomach/Bowel: The stomach, duodenum, small bowel and colon are grossly normal without oral contrast. No inflammatory changes, mass lesions or obstructive findings. The appendix is normal. Vascular/Lymphatic: Insert aorta noncontrast Reproductive: The prostate gland and seminal vesicles are unremarkable. Other: No pelvic mass or adenopathy. No free pelvic fluid collections. No inguinal mass or adenopathy. No abdominal wall hernia or subcutaneous lesions. Musculoskeletal: No significant bony findings. IMPRESSION: 1. No acute abdominal/pelvic findings, mass lesions or adenopathy. 2. No renal, ureteral or bladder calculi or mass. Electronically Signed   By: Rudie Meyer M.D.   On: 09/11/2020 21:27  Procedures Procedures   Medications Ordered in ED Medications  ondansetron (ZOFRAN) injection 4 mg (4 mg Intravenous Given 09/11/20 1829)  sodium chloride 0.9 % bolus 1,000 mL (0 mLs Intravenous Stopped 09/11/20 1907)  haloperidol lactate (HALDOL) injection 5 mg (5 mg Intravenous Given 09/11/20 1829)    ED Course  I have reviewed the triage vital signs and the nursing notes.  Pertinent labs & imaging results that were available during my care of the patient were reviewed by me and considered in my medical decision making (see chart for details).    MDM Rules/Calculators/A&P                          BP 127/87   Pulse 73   Temp 98.8 F (37.1 C) (Oral)   Resp 14   Ht 5\' 5"  (1.651 m)   Wt 63.5 kg   SpO2 99%   BMI 23.30 kg/m   Final Clinical Impression(s) / ED Diagnoses Final diagnoses:  Non-intractable vomiting with nausea, unspecified vomiting type    Rx / DC Orders ED Discharge Orders    None     6:20 PM Patient here with nausea vomiting and some mild abdominal discomfort.  Symptoms ongoing for the past several days.  History of type 1 diabetes but blood sugars well controlled.  Today CBG is 168.  Does have  an elevated white count of 16.1.  Exam of his abdomen is soft nontender.  Have low suspicion for surgical abdomen.  I suspect his symptoms is likely due to cannabinoid hyperemesis syndrome as he does endorse marijuana use.  Will treat symptoms.  9:06 PM Patient does endorse low back pain.  Urinalysis shows moderate hemoglobin and urine dipsticks but no signs of UTI.  Will obtain CT renal stone study to rule out kidney stone causing his discomfort.  He does have an elevated white count of 16.1.  His current CBG is 168.  10:12 PM Fortunately CT renal stone study without any acute finding.  After receiving treatment, patient reported he is feeling much better.  Tolerates p.o.  Stable for discharge.  Encourage patient to avoid marijuana use as his symptoms may be due to cannabinoid hyperemesis syndrome.  Patient voiced understanding with this plan.  Return precaution given.  Since there is evidence of hematuria in his urine, I recommend patient to have a repeat UA in a week if it persist he may need to follow-up with urology for outpatient work-up.   , PA-C 09/11/20 2218    2219, MD 09/23/20 1414

## 2020-11-26 ENCOUNTER — Emergency Department (HOSPITAL_COMMUNITY)
Admission: EM | Admit: 2020-11-26 | Discharge: 2020-11-26 | Disposition: A | Discharge status: Home / Self Care | Payer: BC Managed Care – PPO | Attending: Emergency Medicine | Admitting: Emergency Medicine

## 2020-11-26 ENCOUNTER — Encounter (HOSPITAL_COMMUNITY): Payer: Self-pay

## 2020-11-26 DIAGNOSIS — R1110 Vomiting, unspecified: Secondary | ICD-10-CM | POA: Diagnosis present

## 2020-11-26 DIAGNOSIS — Z5321 Procedure and treatment not carried out due to patient leaving prior to being seen by health care provider: Secondary | ICD-10-CM | POA: Diagnosis not present

## 2020-11-26 LAB — CBC
HCT: 50.5 % (ref 39.0–52.0)
Hemoglobin: 18 g/dL — ABNORMAL HIGH (ref 13.0–17.0)
MCH: 29.9 pg (ref 26.0–34.0)
MCHC: 35.6 g/dL (ref 30.0–36.0)
MCV: 83.7 fL (ref 80.0–100.0)
Platelets: 302 10*3/uL (ref 150–400)
RBC: 6.03 MIL/uL — ABNORMAL HIGH (ref 4.22–5.81)
RDW: 12.1 % (ref 11.5–15.5)
WBC: 17.4 10*3/uL — ABNORMAL HIGH (ref 4.0–10.5)
nRBC: 0 % (ref 0.0–0.2)

## 2020-11-26 LAB — COMPREHENSIVE METABOLIC PANEL
ALT: 31 U/L (ref 0–44)
AST: 29 U/L (ref 15–41)
Albumin: 4.7 g/dL (ref 3.5–5.0)
Alkaline Phosphatase: 100 U/L (ref 38–126)
Anion gap: 16 — ABNORMAL HIGH (ref 5–15)
BUN: 19 mg/dL (ref 6–20)
CO2: 24 mmol/L (ref 22–32)
Calcium: 9.6 mg/dL (ref 8.9–10.3)
Chloride: 94 mmol/L — ABNORMAL LOW (ref 98–111)
Creatinine, Ser: 0.92 mg/dL (ref 0.61–1.24)
GFR, Estimated: >60 mL/min (ref >60)
Glucose, Bld: 144 mg/dL — ABNORMAL HIGH (ref 70–99)
Potassium: 2.9 mmol/L — ABNORMAL LOW (ref 3.5–5.1)
Sodium: 134 mmol/L — ABNORMAL LOW (ref 135–145)
Total Bilirubin: 1.9 mg/dL — ABNORMAL HIGH (ref 0.3–1.2)
Total Protein: 9 g/dL — ABNORMAL HIGH (ref 6.5–8.1)

## 2020-11-26 LAB — LIPASE, BLOOD: Lipase: 24 U/L (ref 11–51)

## 2020-11-26 LAB — CBG MONITORING, ED: Glucose-Capillary: 152 mg/dL — ABNORMAL HIGH (ref 70–99)

## 2020-11-26 NOTE — ED Notes (Signed)
Patient called for room placement x1 with no answer. 

## 2020-11-26 NOTE — ED Triage Notes (Signed)
Pt arrived via POV, c/o vomiting x3 days. Denies any abd pain or diarrhea. Denies any known sick contacts.

## 2020-11-26 NOTE — ED Notes (Signed)
Patient called for in lobby.No answer.

## 2020-11-26 NOTE — ED Provider Notes (Signed)
Emergency Medicine Provider Triage Evaluation Note  Mason Mclaughlin , a 25 y.o. male  was evaluated in triage.  Pt complains of emesis x a few days. Feels like this is related to "something I ate or drank". Has taken Zofran without improvement. +THC use. - hematemesis. Slight abdominal pain generalized along with lower back pain.  Review of Systems  Positive: Abdominal pain, vomiting, nausea Negative: Fever, chest pain, urinary symptoms  Physical Exam  BP 123/82 (BP Location: Left Arm)   Pulse (!) 120   Temp 98.6 F (37 C)   Resp 18   SpO2 100%  Gen:   Awake, no distress   Resp:  Normal effort  MSK:   Moves extremities without difficulty  Other:    Medical Decision Making  Medically screening exam initiated at 12:10 PM.  Appropriate orders placed.  Mason Mclaughlin was informed that the remainder of the evaluation will be completed by another provider, this initial triage assessment does not replace that evaluation, and the importance of remaining in the ED until their evaluation is complete.  Multiple visits for non-intractable vomiting, prior hx of DMT1. CBG ordered.    Claude Manges, PA-C 11/26/20 1213    Mancel Bale, MD 11/26/20 [phone]

## 2020-11-26 NOTE — ED Notes (Signed)
I called patient to take back to a room and no one responded 

## 2021-02-15 ENCOUNTER — Encounter (HOSPITAL_COMMUNITY): Payer: Self-pay | Admitting: Emergency Medicine

## 2021-02-15 ENCOUNTER — Other Ambulatory Visit: Payer: Self-pay

## 2021-02-15 ENCOUNTER — Emergency Department (HOSPITAL_COMMUNITY): Payer: BC Managed Care – PPO

## 2021-02-15 ENCOUNTER — Emergency Department (HOSPITAL_COMMUNITY)
Admission: EM | Admit: 2021-02-15 | Discharge: 2021-02-15 | Disposition: A | Discharge status: Home / Self Care | Payer: BC Managed Care – PPO | Attending: Emergency Medicine | Admitting: Emergency Medicine

## 2021-02-15 DIAGNOSIS — I10 Essential (primary) hypertension: Secondary | ICD-10-CM | POA: Diagnosis not present

## 2021-02-15 DIAGNOSIS — R112 Nausea with vomiting, unspecified: Secondary | ICD-10-CM

## 2021-02-15 DIAGNOSIS — Z79899 Other long term (current) drug therapy: Secondary | ICD-10-CM | POA: Insufficient documentation

## 2021-02-15 DIAGNOSIS — E1065 Type 1 diabetes mellitus with hyperglycemia: Secondary | ICD-10-CM | POA: Insufficient documentation

## 2021-02-15 DIAGNOSIS — Z794 Long term (current) use of insulin: Secondary | ICD-10-CM | POA: Insufficient documentation

## 2021-02-15 DIAGNOSIS — R739 Hyperglycemia, unspecified: Secondary | ICD-10-CM

## 2021-02-15 DIAGNOSIS — R109 Unspecified abdominal pain: Secondary | ICD-10-CM | POA: Diagnosis present

## 2021-02-15 LAB — CBC WITH DIFFERENTIAL/PLATELET
Abs Immature Granulocytes: 0.07 10*3/uL (ref 0.00–0.07)
Basophils Absolute: 0.1 10*3/uL (ref 0.0–0.1)
Basophils Relative: 0 %
Eosinophils Absolute: 0 10*3/uL (ref 0.0–0.5)
Eosinophils Relative: 0 %
HCT: 45.6 % (ref 39.0–52.0)
Hemoglobin: 16 g/dL (ref 13.0–17.0)
Immature Granulocytes: 0 %
Lymphocytes Relative: 5 %
Lymphs Abs: 0.9 10*3/uL (ref 0.7–4.0)
MCH: 30.3 pg (ref 26.0–34.0)
MCHC: 35.1 g/dL (ref 30.0–36.0)
MCV: 86.4 fL (ref 80.0–100.0)
Monocytes Absolute: 0.3 10*3/uL (ref 0.1–1.0)
Monocytes Relative: 1 %
Neutro Abs: 17.8 10*3/uL — ABNORMAL HIGH (ref 1.7–7.7)
Neutrophils Relative %: 94 %
Platelets: 323 10*3/uL (ref 150–400)
RBC: 5.28 MIL/uL (ref 4.22–5.81)
RDW: 13 % (ref 11.5–15.5)
WBC: 19.1 10*3/uL — ABNORMAL HIGH (ref 4.0–10.5)
nRBC: 0 % (ref 0.0–0.2)

## 2021-02-15 LAB — COMPREHENSIVE METABOLIC PANEL
ALT: 33 U/L (ref 0–44)
AST: 42 U/L — ABNORMAL HIGH (ref 15–41)
Albumin: 5.1 g/dL — ABNORMAL HIGH (ref 3.5–5.0)
Alkaline Phosphatase: 83 U/L (ref 38–126)
Anion gap: 16 — ABNORMAL HIGH (ref 5–15)
BUN: 16 mg/dL (ref 6–20)
CO2: 20 mmol/L — ABNORMAL LOW (ref 22–32)
Calcium: 10.6 mg/dL — ABNORMAL HIGH (ref 8.9–10.3)
Chloride: 105 mmol/L (ref 98–111)
Creatinine, Ser: 1.22 mg/dL (ref 0.61–1.24)
GFR, Estimated: >60 mL/min (ref >60)
Glucose, Bld: 305 mg/dL — ABNORMAL HIGH (ref 70–99)
Potassium: 4.3 mmol/L (ref 3.5–5.1)
Sodium: 141 mmol/L (ref 135–145)
Total Bilirubin: 1.5 mg/dL — ABNORMAL HIGH (ref 0.3–1.2)
Total Protein: 9.2 g/dL — ABNORMAL HIGH (ref 6.5–8.1)

## 2021-02-15 LAB — URINALYSIS, ROUTINE W REFLEX MICROSCOPIC
Bacteria, UA: NONE SEEN
Bilirubin Urine: NEGATIVE
Glucose, UA: >=1000 mg/dL
Ketones, ur: >=80 mg/dL — AB
Leukocytes,Ua: NEGATIVE
Nitrite: NEGATIVE
Protein, ur: NEGATIVE mg/dL
Specific Gravity, Urine: 1.015 (ref 1.005–1.030)
pH: 6 (ref 5.0–8.0)

## 2021-02-15 LAB — RAPID URINE DRUG SCREEN, HOSP PERFORMED
Amphetamines: NOT DETECTED
Barbiturates: NOT DETECTED
Benzodiazepines: NOT DETECTED
Cocaine: NOT DETECTED
Opiates: NOT DETECTED
Tetrahydrocannabinol: POSITIVE — AB

## 2021-02-15 LAB — BLOOD GAS, VENOUS
Acid-base deficit: 3.3 mmol/L — ABNORMAL HIGH (ref 0.0–2.0)
Bicarbonate: 19.2 mmol/L — ABNORMAL LOW (ref 20.0–28.0)
O2 Saturation: 80.9 %
Patient temperature: 98.6
pCO2, Ven: 29.2 mmHg — ABNORMAL LOW (ref 44.0–60.0)
pH, Ven: 7.434 — ABNORMAL HIGH (ref 7.250–7.430)
pO2, Ven: 49.1 mmHg — ABNORMAL HIGH (ref 32.0–45.0)

## 2021-02-15 LAB — CBG MONITORING, ED
Glucose-Capillary: 307 mg/dL — ABNORMAL HIGH (ref 70–99)
Glucose-Capillary: 305 mg/dL — ABNORMAL HIGH (ref 70–99)

## 2021-02-15 LAB — LACTIC ACID, PLASMA
Lactic Acid, Venous: 3.7 mmol/L (ref 0.5–1.9)
Lactic Acid, Venous: 3 mmol/L (ref 0.5–1.9)

## 2021-02-15 LAB — BETA-HYDROXYBUTYRIC ACID: Beta-Hydroxybutyric Acid: 3.37 mmol/L — ABNORMAL HIGH (ref 0.05–0.27)

## 2021-02-15 MED ORDER — HYDROMORPHONE HCL 1 MG/ML IJ SOLN
1.0000 mg | Freq: Once | INTRAMUSCULAR | Status: AC
Start: 2021-02-15 — End: 2021-02-15
  Administered 2021-02-15: 1 mg via INTRAVENOUS
  Filled 2021-02-15: qty 1

## 2021-02-15 MED ORDER — ONDANSETRON 8 MG PO TBDP: ORAL_TABLET | ORAL | 0 refills | Status: DC

## 2021-02-15 MED ORDER — LACTATED RINGERS IV BOLUS
1000.0000 mL | Freq: Once | INTRAVENOUS | Status: AC
  Administered 2021-02-15: 1000 mL via INTRAVENOUS

## 2021-02-15 MED ORDER — ONDANSETRON HCL 4 MG PO TABS: 4.0000 mg | ORAL_TABLET | Freq: Three times a day (TID) | ORAL | 0 refills | Status: DC | PRN

## 2021-02-15 MED ORDER — ONDANSETRON HCL 4 MG/2ML IJ SOLN
4.0000 mg | Freq: Once | INTRAMUSCULAR | Status: AC
  Administered 2021-02-15: 4 mg via INTRAVENOUS
  Filled 2021-02-15: qty 2

## 2021-02-15 MED ORDER — SODIUM CHLORIDE 0.9 % IV BOLUS
1000.0000 mL | Freq: Once | INTRAVENOUS | Status: AC
  Administered 2021-02-15: 1000 mL via INTRAVENOUS

## 2021-02-15 MED ORDER — IOHEXOL 350 MG/ML SOLN
80.0000 mL | Freq: Once | INTRAVENOUS | Status: AC | PRN
  Administered 2021-02-15: 80 mL via INTRAVENOUS

## 2021-02-15 MED ORDER — LACTATED RINGERS IV BOLUS: 20.0000 mL/kg | Freq: Once | INTRAVENOUS | Status: DC

## 2021-02-15 MED ORDER — LABETALOL HCL 5 MG/ML IV SOLN
20.0000 mg | Freq: Once | INTRAVENOUS | Status: AC
  Administered 2021-02-15: 20 mg via INTRAVENOUS
  Filled 2021-02-15: qty 4

## 2021-02-15 NOTE — ED Notes (Signed)
Patient transported to CT 

## 2021-02-15 NOTE — ED Notes (Signed)
Urinal bedside for urine specimen collection  

## 2021-02-15 NOTE — ED Notes (Signed)
Fluid challenge done. Pt tolerated well. No complain of nausea. EDP informed.

## 2021-02-15 NOTE — ED Triage Notes (Signed)
Patient here from home reporting hyperglycemia, abd pain, n/v that started x2 days ago. Syncopal episode in the lobby. Vomiting x1 in triage. Type 1 diabetic.

## 2021-02-15 NOTE — Discharge Instructions (Addendum)
Begin taking Zofran as prescribed as needed for nausea.  Clear liquid diet for the next 12 hours, then advance to normal as tolerated.  Return to the emergency department if symptoms significantly worsen or change.

## 2021-02-15 NOTE — ED Provider Notes (Signed)
  Physical Exam  BP (!) 177/109   Pulse 100   Temp 98.1 F (36.7 C) (Oral)   Resp (!) 23   SpO2 100%   Physical Exam Vitals and nursing note reviewed.  Constitutional:      General: He is not in acute distress.    Appearance: He is well-developed. He is not ill-appearing.  Pulmonary:     Effort: Pulmonary effort is normal.  Skin:    General: Skin is warm and dry.  Neurological:     Mental Status: He is alert.    ED Course/Procedures   Clinical Course as of 02/15/21 2340  Sun Feb 15, 2021  2236 Patient able to tolerate PO fluids.  [AW]    Clinical Course User Index [AW] Koleen Distance, MD    Procedures  MDM   Care assumed from Dr. Delford Field at shift change.  Patient awaiting results of the repeat lactate.  Patient presented here with nausea, vomiting, and elevated blood sugars.  Sugars are improving with fluids as is lactate.  Patient reevaluated and is feeling better and tolerating p.o.  At this point, discharge seems appropriate with as needed return.  Patient will be prescribed Zofran.       Geoffery Lyons, MD 02/15/21 2340

## 2021-02-15 NOTE — ED Provider Notes (Signed)
[facility]-EMERGENCY DEPT Provider Note   CSN: [id number] Arrival date & time: 02/15/21  1758     History Chief Complaint  Patient presents with   Abdominal Pain   Hyperglycemia    Mason Mclaughlin is a 25 y.o. male.  He was evaluated at [facility] about 1 month ago for similar problems.  It was thought at that time that some of his recurrent nausea and vomiting is secondary to marijuana use.  The history is provided by the patient.  Emesis Severity:  Severe Duration:  3 days Timing:  Intermittent Quality:  Stomach contents Progression:  Unchanged Chronicity:  Recurrent Relieved by:  Nothing Worsened by:  Nothing Ineffective treatments:  None tried Associated symptoms: abdominal pain (diffuse)   Associated symptoms: no arthralgias, no chills, no cough, no diarrhea, no fever and no sore throat   Risk factors: diabetes       Past Medical History:  Diagnosis Date   Diabetes (HCC)    DKA (diabetic ketoacidoses)    DKA, type 1 (HCC)    Insulin pump in place     Patient Active Problem List   Diagnosis Date Noted   DKA (diabetic ketoacidosis) (HCC) 05/05/2020   Nausea & vomiting 05/05/2020   Hyperkalemia 11/23/2019   Leukocytosis 11/23/2019   Hypertension complicating diabetes (HCC) 11/23/2019   Type 1 diabetes mellitus with ketoacidosis without coma (HCC)    Hyperglycemia 07/06/2019   Elevated blood pressure reading 07/06/2019   ARF (acute renal failure) (HCC) 04/17/2018   AKI (acute kidney injury) (HCC)    Nausea and vomiting    DKA (diabetic ketoacidoses) 05/29/2017    History reviewed. No pertinent surgical history.     Family History  Problem Relation Age of Onset   Healthy Mother    Diabetes Father     Social History   Tobacco Use   Smoking status: Never   Smokeless tobacco: Never  Vaping Use   Vaping Use: Never used  Substance Use Topics   Alcohol use: Yes   Drug use: Yes    Frequency: 4.0 times per week    Types:  Marijuana    Home Medications Prior to Admission medications   Medication Sig Start Date End Date Taking? Authorizing Provider  insulin aspart (NOVOLOG) 100 UNIT/ML injection Inject 60 Units into the skin daily. Max dose 60 units - pump 11/13/19   [provider]  ondansetron (ZOFRAN) 4 MG tablet Take 4 mg by mouth every 8 (eight) hours as needed for nausea or vomiting. 09/10/20   [provider]    Allergies    Patient has no known allergies.  Review of Systems   Review of Systems  Constitutional:  Negative for chills and fever.  HENT:  Negative for ear pain and sore throat.   Eyes:  Negative for pain and visual disturbance.  Respiratory:  Negative for cough and shortness of breath.   Cardiovascular:  Negative for chest pain and palpitations.  Gastrointestinal:  Positive for abdominal pain (diffuse), nausea and vomiting. Negative for diarrhea.  Genitourinary:  Negative for dysuria and hematuria.  Musculoskeletal:  Negative for arthralgias and back pain.  Skin:  Negative for color change and rash.  Neurological:  Negative for seizures and syncope.  All other systems reviewed and are negative.  Physical Exam Updated Vital Signs BP (!) 176/111   Pulse (!) 121   Resp 14   SpO2 98%   Physical Exam Vitals and nursing note reviewed.  Constitutional:  Comments: Actively vomiting  HENT:     Head: Normocephalic and atraumatic.  Eyes:     Conjunctiva/sclera: Conjunctivae normal.  Pulmonary:     Effort: Pulmonary effort is normal. No respiratory distress.  Abdominal:     Palpations: Abdomen is soft.     Tenderness: There is no abdominal tenderness.  Musculoskeletal:        General: No deformity. Normal range of motion.     Cervical back: Normal range of motion.  Skin:    General: Skin is warm and dry.  Neurological:     General: No focal deficit present.     Mental Status: He is alert and oriented to person, place, and time. Mental status is at baseline.   Psychiatric:        Mood and Affect: Mood normal.    ED Results / Procedures / Treatments   Labs (all labs ordered are listed, but only abnormal results are displayed) Labs Reviewed  BETA-HYDROXYBUTYRIC ACID - Abnormal; Notable for the following components:      Result Value   Beta-Hydroxybutyric Acid 3.37 (*)    All other components within normal limits  CBC WITH DIFFERENTIAL/PLATELET - Abnormal; Notable for the following components:   WBC 19.1 (*)    Neutro Abs 17.8 (*)    All other components within normal limits  URINALYSIS, ROUTINE W REFLEX MICROSCOPIC - Abnormal; Notable for the following components:   Color, Urine YELLOW (*)    APPearance CLEAR (*)    Hgb urine dipstick SMALL (*)    Ketones, ur >=80 (*)    All other components within normal limits  BLOOD GAS, VENOUS - Abnormal; Notable for the following components:   pH, Ven 7.434 (*)    pCO2, Ven 29.2 (*)    pO2, Ven 49.1 (*)    Bicarbonate 19.2 (*)    Acid-base deficit 3.3 (*)    All other components within normal limits  LACTIC ACID, PLASMA - Abnormal; Notable for the following components:   Lactic Acid, Venous 3.7 (*)    All other components within normal limits  COMPREHENSIVE METABOLIC PANEL - Abnormal; Notable for the following components:   CO2 20 (*)    Glucose, Bld 305 (*)    Calcium 10.6 (*)    Total Protein 9.2 (*)    Albumin 5.1 (*)    AST 42 (*)    Total Bilirubin 1.5 (*)    Anion gap 16 (*)    All other components within normal limits  RAPID URINE DRUG SCREEN, HOSP PERFORMED - Abnormal; Notable for the following components:   Tetrahydrocannabinol POSITIVE (*)    All other components within normal limits  CBG MONITORING, ED - Abnormal; Notable for the following components:   Glucose-Capillary 307 (*)    All other components within normal limits  CBG MONITORING, ED - Abnormal; Notable for the following components:   Glucose-Capillary 305 (*)    All other components within normal limits   BETA-HYDROXYBUTYRIC ACID  LACTIC ACID, PLASMA    EKG None  Radiology No results found.  Procedures Procedures   Medications Ordered in ED Medications  lactated ringers bolus 20 mL/kg (has no administration in time range)  ondansetron (ZOFRAN) injection 4 mg (has no administration in time range)    ED Course  I have reviewed the triage vital signs and the nursing notes.  Pertinent labs & imaging results that were available during my care of the patient were reviewed by me and considered in my medical  decision making (see chart for details).  Clinical Course as of 02/15/21 2242  Wynelle Link Feb 15, 2021  2236 Patient able to tolerate PO fluids.  [AW]    Clinical Course User Index [AW] Koleen Distance, MD   MDM Rules/Calculators/A&P                           Shamarcus Hoheisel has a history of type 1 diabetes and hypertension.  He presents with nausea, vomiting, and abdominal pain x3 days.  He had an episode of syncope in the lobby.  He was hypertensive and tachycardic on arrival.  He was given symptomatic management.  He was hyperglycemic, but anion gap was only 16.  pH normal.  Not truly in diabetic ketoacidosis.  Secondary to elevated white blood cell count and lactic acid, CT scan was ordered to exclude intra-abdominal pathology.  This was within normal limits.  Ultimately, he was able to tolerate fluids. He will be discharged home. Final Clinical Impression(s) / ED Diagnoses Final diagnoses:  Hyperglycemia  Non-intractable vomiting with nausea, unspecified vomiting type    Rx / DC Orders ED Discharge Orders          Ordered    ondansetron (ZOFRAN) 4 MG tablet  Every 8 hours PRN        02/15/21 2237             Koleen Distance, MD 02/17/21 1807

## 2021-03-19 ENCOUNTER — Emergency Department (HOSPITAL_COMMUNITY)
Admission: EM | Admit: 2021-03-19 | Discharge: 2021-03-19 | Disposition: A | Discharge status: Home / Self Care | Payer: BC Managed Care – PPO | Attending: Emergency Medicine | Admitting: Emergency Medicine

## 2021-03-19 ENCOUNTER — Encounter (HOSPITAL_COMMUNITY): Payer: Self-pay

## 2021-03-19 DIAGNOSIS — Z20822 Contact with and (suspected) exposure to covid-19: Secondary | ICD-10-CM | POA: Insufficient documentation

## 2021-03-19 DIAGNOSIS — R112 Nausea with vomiting, unspecified: Secondary | ICD-10-CM

## 2021-03-19 DIAGNOSIS — E1065 Type 1 diabetes mellitus with hyperglycemia: Secondary | ICD-10-CM | POA: Diagnosis not present

## 2021-03-19 DIAGNOSIS — E1010 Type 1 diabetes mellitus with ketoacidosis without coma: Secondary | ICD-10-CM | POA: Diagnosis not present

## 2021-03-19 DIAGNOSIS — Z79899 Other long term (current) drug therapy: Secondary | ICD-10-CM | POA: Insufficient documentation

## 2021-03-19 DIAGNOSIS — E860 Dehydration: Secondary | ICD-10-CM | POA: Diagnosis not present

## 2021-03-19 DIAGNOSIS — Z794 Long term (current) use of insulin: Secondary | ICD-10-CM | POA: Diagnosis not present

## 2021-03-19 DIAGNOSIS — R000 Tachycardia, unspecified: Secondary | ICD-10-CM | POA: Diagnosis not present

## 2021-03-19 LAB — COMPREHENSIVE METABOLIC PANEL
ALT: 24 U/L (ref 0–44)
AST: 30 U/L (ref 15–41)
Albumin: 4.9 g/dL (ref 3.5–5.0)
Alkaline Phosphatase: 91 U/L (ref 38–126)
Anion gap: 11 (ref 5–15)
BUN: 17 mg/dL (ref 6–20)
CO2: 26 mmol/L (ref 22–32)
Calcium: 9.9 mg/dL (ref 8.9–10.3)
Chloride: 98 mmol/L (ref 98–111)
Creatinine, Ser: 1.04 mg/dL (ref 0.61–1.24)
GFR, Estimated: >60 mL/min (ref >60)
Glucose, Bld: 263 mg/dL — ABNORMAL HIGH (ref 70–99)
Potassium: 3.5 mmol/L (ref 3.5–5.1)
Sodium: 135 mmol/L (ref 135–145)
Total Bilirubin: 0.9 mg/dL (ref 0.3–1.2)
Total Protein: 9.2 g/dL — ABNORMAL HIGH (ref 6.5–8.1)

## 2021-03-19 LAB — CBC WITH DIFFERENTIAL/PLATELET
Abs Immature Granulocytes: 0.05 10*3/uL (ref 0.00–0.07)
Basophils Absolute: 0 10*3/uL (ref 0.0–0.1)
Basophils Relative: 0 %
Eosinophils Absolute: 0 10*3/uL (ref 0.0–0.5)
Eosinophils Relative: 0 %
HCT: 41.6 % (ref 39.0–52.0)
Hemoglobin: 14.8 g/dL (ref 13.0–17.0)
Immature Granulocytes: 0 %
Lymphocytes Relative: 9 %
Lymphs Abs: 1.3 10*3/uL (ref 0.7–4.0)
MCH: 30.2 pg (ref 26.0–34.0)
MCHC: 35.6 g/dL (ref 30.0–36.0)
MCV: 84.9 fL (ref 80.0–100.0)
Monocytes Absolute: 0.7 10*3/uL (ref 0.1–1.0)
Monocytes Relative: 5 %
Neutro Abs: 12.2 10*3/uL — ABNORMAL HIGH (ref 1.7–7.7)
Neutrophils Relative %: 86 %
Platelets: 273 10*3/uL (ref 150–400)
RBC: 4.9 MIL/uL (ref 4.22–5.81)
RDW: 12.5 % (ref 11.5–15.5)
WBC: 14.2 10*3/uL — ABNORMAL HIGH (ref 4.0–10.5)
nRBC: 0 % (ref 0.0–0.2)

## 2021-03-19 LAB — URINALYSIS, ROUTINE W REFLEX MICROSCOPIC
Bacteria, UA: NONE SEEN
Bilirubin Urine: NEGATIVE
Glucose, UA: >=500 mg/dL — AB
Ketones, ur: 80 mg/dL — AB
Leukocytes,Ua: NEGATIVE
Nitrite: NEGATIVE
Protein, ur: 100 mg/dL — AB
Specific Gravity, Urine: 1.042 — ABNORMAL HIGH (ref 1.005–1.030)
pH: 6 (ref 5.0–8.0)

## 2021-03-19 LAB — CBG MONITORING, ED: Glucose-Capillary: 255 mg/dL — ABNORMAL HIGH (ref 70–99)

## 2021-03-19 LAB — RESP PANEL BY RT-PCR (FLU A&B, COVID) ARPGX2
Influenza A by PCR: NEGATIVE
Influenza B by PCR: NEGATIVE
SARS Coronavirus 2 by RT PCR: NEGATIVE

## 2021-03-19 LAB — RAPID URINE DRUG SCREEN, HOSP PERFORMED
Amphetamines: NOT DETECTED
Barbiturates: NOT DETECTED
Benzodiazepines: NOT DETECTED
Cocaine: NOT DETECTED
Opiates: NOT DETECTED
Tetrahydrocannabinol: POSITIVE — AB

## 2021-03-19 LAB — BLOOD GAS, VENOUS
Acid-Base Excess: 3.7 mmol/L — ABNORMAL HIGH (ref 0.0–2.0)
Bicarbonate: 28.4 mmol/L — ABNORMAL HIGH (ref 20.0–28.0)
O2 Saturation: 67.1 %
Patient temperature: 37
pCO2, Ven: 44.9 mmHg (ref 44.0–60.0)
pH, Ven: 7.418 (ref 7.250–7.430)
pO2, Ven: 37.6 mmHg (ref 32.0–45.0)

## 2021-03-19 LAB — LIPASE, BLOOD: Lipase: 23 U/L (ref 11–51)

## 2021-03-19 MED ORDER — MORPHINE SULFATE (PF) 4 MG/ML IV SOLN
4.0000 mg | Freq: Once | INTRAVENOUS | Status: AC
  Administered 2021-03-19: 4 mg via INTRAVENOUS
  Filled 2021-03-19: qty 1

## 2021-03-19 MED ORDER — SODIUM CHLORIDE 0.9 % IV BOLUS
1000.0000 mL | Freq: Once | INTRAVENOUS | Status: AC
  Administered 2021-03-19: 1000 mL via INTRAVENOUS

## 2021-03-19 MED ORDER — PROMETHAZINE HCL 25 MG PO TABS: 25.0000 mg | ORAL_TABLET | Freq: Four times a day (QID) | ORAL | 0 refills | Status: DC | PRN

## 2021-03-19 MED ORDER — ACETAMINOPHEN 325 MG PO TABS: 650.0000 mg | ORAL_TABLET | Freq: Once | ORAL | Status: DC

## 2021-03-19 MED ORDER — FAMOTIDINE 20 MG PO TABS: 20.0000 mg | ORAL_TABLET | Freq: Two times a day (BID) | ORAL | 0 refills | Status: DC

## 2021-03-19 MED ORDER — ONDANSETRON HCL 4 MG/2ML IJ SOLN
4.0000 mg | Freq: Once | INTRAMUSCULAR | Status: AC
  Administered 2021-03-19: 4 mg via INTRAVENOUS
  Filled 2021-03-19: qty 2

## 2021-03-19 NOTE — ED Triage Notes (Signed)
Pt arrives today c/o emesis. Pt falling asleep in the middle of triage. Pt is a diabetic.

## 2021-03-19 NOTE — ED Provider Notes (Signed)
[facility]-EMERGENCY DEPT Provider Note   CSN: [id number] Arrival date & time: 03/19/21  1706     History Chief Complaint  Patient presents with   Emesis    Mason Mclaughlin is a 25 y.o. male.  Pt presents to the ED today with n/v.  Pt said he has been vomiting for the past 2 days.  He has some abdominal tenderness.  He is a type 1 diabetic, but has been compliant with his insulin pump.  Pt has a low grade fever.        Past Medical History:  Diagnosis Date   Diabetes (HCC)    DKA (diabetic ketoacidoses)    DKA, type 1 (HCC)    Insulin pump in place     Patient Active Problem List   Diagnosis Date Noted   DKA (diabetic ketoacidosis) (HCC) 05/05/2020   Nausea & vomiting 05/05/2020   Hyperkalemia 11/23/2019   Leukocytosis 11/23/2019   Hypertension complicating diabetes (HCC) 11/23/2019   Type 1 diabetes mellitus with ketoacidosis without coma (HCC)    Hyperglycemia 07/06/2019   Elevated blood pressure reading 07/06/2019   ARF (acute renal failure) (HCC) 04/17/2018   AKI (acute kidney injury) (HCC)    Nausea and vomiting    DKA (diabetic ketoacidoses) 05/29/2017    History reviewed. No pertinent surgical history.     Family History  Problem Relation Age of Onset   Healthy Mother    Diabetes Father     Social History   Tobacco Use   Smoking status: Never   Smokeless tobacco: Never  Vaping Use   Vaping Use: Never used  Substance Use Topics   Alcohol use: Yes   Drug use: Yes    Frequency: 4.0 times per week    Types: Marijuana    Home Medications Prior to Admission medications   Medication Sig Start Date End Date Taking? Authorizing Provider  famotidine (PEPCID) 20 MG tablet Take 1 tablet (20 mg total) by mouth 2 (two) times daily. 03/19/21  Yes Jacalyn Lefevre, MD  promethazine (PHENERGAN) 25 MG tablet Take 1 tablet (25 mg total) by mouth every 6 (six) hours as needed for nausea or vomiting. 03/19/21  Yes Jacalyn Lefevre, MD   insulin aspart (NOVOLOG) 100 UNIT/ML injection Inject 60 Units into the skin daily. Max dose 60 units - pump 11/13/19   [provider]  ondansetron (ZOFRAN ODT) 8 MG disintegrating tablet 8mg  ODT q4 hours prn nausea 02/15/21   02/17/21, MD  ondansetron (ZOFRAN) 4 MG tablet Take 1 tablet (4 mg total) by mouth every 8 (eight) hours as needed for up to 20 doses for nausea or vomiting. 02/15/21   02/17/21, MD    Allergies    Patient has no known allergies.  Review of Systems   Review of Systems  Constitutional:  Positive for fever.  Gastrointestinal:  Positive for abdominal pain, nausea and vomiting.  All other systems reviewed and are negative.  Physical Exam Updated Vital Signs BP (!) 161/74   Pulse (!) 108   Temp 100.1 F (37.8 C) (Oral)   Resp 18   Ht 5\' 5"  (1.651 m)   Wt 63.5 kg   SpO2 100%   BMI 23.30 kg/m   Physical Exam Vitals and nursing note reviewed.  Constitutional:      Appearance: Normal appearance.  HENT:     Head: Normocephalic and atraumatic.     Right Ear: External ear normal.     Left Ear: External  ear normal.     Nose: Nose normal.     Mouth/Throat:     Mouth: Mucous membranes are dry.  Eyes:     Pupils: Pupils are equal, round, and reactive to light.  Cardiovascular:     Rate and Rhythm: Regular rhythm. Tachycardia present.     Pulses: Normal pulses.     Heart sounds: Normal heart sounds.  Pulmonary:     Effort: Pulmonary effort is normal.     Breath sounds: Normal breath sounds.  Abdominal:     General: Abdomen is flat. Bowel sounds are normal.     Palpations: Abdomen is soft.     Tenderness: There is generalized abdominal tenderness.  Musculoskeletal:        General: Normal range of motion.     Cervical back: Normal range of motion and neck supple.  Skin:    General: Skin is warm.     Capillary Refill: Capillary refill takes less than 2 seconds.  Neurological:     General: No focal deficit present.     Mental Status: He  is alert and oriented to person, place, and time.  Psychiatric:        Mood and Affect: Mood normal.        Behavior: Behavior normal.    ED Results / Procedures / Treatments   Labs (all labs ordered are listed, but only abnormal results are displayed) Labs Reviewed  CBC WITH DIFFERENTIAL/PLATELET - Abnormal; Notable for the following components:      Result Value   WBC 14.2 (*)    Neutro Abs 12.2 (*)    All other components within normal limits  COMPREHENSIVE METABOLIC PANEL - Abnormal; Notable for the following components:   Glucose, Bld 263 (*)    Total Protein 9.2 (*)    All other components within normal limits  URINALYSIS, ROUTINE W REFLEX MICROSCOPIC - Abnormal; Notable for the following components:   Specific Gravity, Urine 1.042 (*)    Glucose, UA >=500 (*)    Hgb urine dipstick MODERATE (*)    Ketones, ur 80 (*)    Protein, ur 100 (*)    All other components within normal limits  BLOOD GAS, VENOUS - Abnormal; Notable for the following components:   Bicarbonate 28.4 (*)    Acid-Base Excess 3.7 (*)    All other components within normal limits  RAPID URINE DRUG SCREEN, HOSP PERFORMED - Abnormal; Notable for the following components:   Tetrahydrocannabinol POSITIVE (*)    All other components within normal limits  CBG MONITORING, ED - Abnormal; Notable for the following components:   Glucose-Capillary 255 (*)    All other components within normal limits  RESP PANEL BY RT-PCR (FLU A&B, COVID) ARPGX2  LIPASE, BLOOD  ETHANOL    EKG EKG Interpretation  Date/Time:  Thursday March 19 2021 17:50:38 EDT Ventricular Rate:  109 PR Interval:  77 QRS Duration: 78 QT Interval:  296 QTC Calculation: 399 R Axis:   69 Text Interpretation: Sinus tachycardia Borderline repolarization abnormality Since last tracing rate faster Confirmed by Jacalyn Lefevre [phone]) on 03/19/2021 8:05:20 PM  Radiology No results found.  Procedures Procedures   Medications Ordered in  ED Medications  acetaminophen (TYLENOL) tablet 650 mg (650 mg Oral Patient Refused/Not Given 03/19/21 1927)  sodium chloride 0.9 % bolus 1,000 mL (0 mLs Intravenous Stopped 03/19/21 2108)  ondansetron (ZOFRAN) injection 4 mg (4 mg Intravenous Given 03/19/21 1924)  morphine 4 MG/ML injection 4 mg (4 mg Intravenous Given  03/19/21 1924)    ED Course  I have reviewed the triage vital signs and the nursing notes.  Pertinent labs & imaging results that were available during my care of the patient were reviewed by me and considered in my medical decision making (see chart for details).    MDM Rules/Calculators/A&P                           Pt is not in DKA.  Pt is feeling much better after fluids and meds.  He is able to tolerate po fluids.  He is stable for d/c.  Return if worse.  F/u with pcp. Final Clinical Impression(s) / ED Diagnoses Final diagnoses:  Dehydration  Nausea and vomiting, unspecified vomiting type  Hyperglycemia due to type 1 diabetes mellitus (HCC)    Rx / DC Orders ED Discharge Orders          Ordered    promethazine (PHENERGAN) 25 MG tablet  Every 6 hours PRN        03/19/21 2200    famotidine (PEPCID) 20 MG tablet  2 times daily        03/19/21 2202             Jacalyn Lefevre, MD 03/19/21 2203

## 2021-03-19 NOTE — ED Provider Notes (Addendum)
Emergency Medicine Provider Triage Evaluation Note  Mason Mclaughlin , a 25 y.o. male  was evaluated in triage. Pt laying on floor when I enter the triage room. He appears to have slid off the exam chair. He had previously ambulated to the room. He is c/o nv 2 days with abd pain.  Review of Systems  Positive: Nv, abd pain Negative: fever  Physical Exam  There were no vitals taken for this visit. Gen:   Falling asleep in chair but arousable to voice Resp:  Normal effort  MSK:   Moves extremities without difficulty  Other:  Tachycardic, No facial droop, no tongue biting or incontinence, moving all extremities and able to stand with assistance, perrl  Medical Decision Making  Medically screening exam initiated at 5:45 PM.  Appropriate orders placed.  Dessie Delcarlo was informed that the remainder of the evaluation will be completed by another provider, this initial triage assessment does not replace that evaluation, and the importance of remaining in the ED until their evaluation is complete.  Nursing advised pt needs to be prioritized for a room.    Karrie Meres, PA-C 03/19/21 1743    Karrie Meres, PA-C 03/19/21 1745    Jacalyn Lefevre, MD 03/19/21 1757

## 2021-07-19 ENCOUNTER — Emergency Department (HOSPITAL_COMMUNITY)
Admission: EM | Admit: 2021-07-19 | Discharge: 2021-07-19 | Disposition: A | Discharge status: Home / Self Care | Payer: BC Managed Care – PPO | Attending: Emergency Medicine | Admitting: Emergency Medicine

## 2021-07-19 ENCOUNTER — Emergency Department (HOSPITAL_COMMUNITY): Payer: BC Managed Care – PPO

## 2021-07-19 DIAGNOSIS — E1165 Type 2 diabetes mellitus with hyperglycemia: Secondary | ICD-10-CM | POA: Insufficient documentation

## 2021-07-19 DIAGNOSIS — Z794 Long term (current) use of insulin: Secondary | ICD-10-CM | POA: Insufficient documentation

## 2021-07-19 DIAGNOSIS — E860 Dehydration: Secondary | ICD-10-CM | POA: Diagnosis not present

## 2021-07-19 DIAGNOSIS — Z20822 Contact with and (suspected) exposure to covid-19: Secondary | ICD-10-CM | POA: Insufficient documentation

## 2021-07-19 DIAGNOSIS — R112 Nausea with vomiting, unspecified: Secondary | ICD-10-CM | POA: Diagnosis not present

## 2021-07-19 DIAGNOSIS — D72829 Elevated white blood cell count, unspecified: Secondary | ICD-10-CM | POA: Diagnosis not present

## 2021-07-19 DIAGNOSIS — R1111 Vomiting without nausea: Secondary | ICD-10-CM

## 2021-07-19 LAB — RESP PANEL BY RT-PCR (FLU A&B, COVID) ARPGX2
Influenza A by PCR: NEGATIVE
Influenza B by PCR: NEGATIVE
SARS Coronavirus 2 by RT PCR: POSITIVE — AB

## 2021-07-19 LAB — BLOOD GAS, VENOUS
Acid-Base Excess: 2.6 mmol/L — ABNORMAL HIGH (ref 0.0–2.0)
Bicarbonate: 27.2 mmol/L (ref 20.0–28.0)
O2 Saturation: 74.5 %
Patient temperature: 37
pCO2, Ven: 41 mmHg — ABNORMAL LOW (ref 44–60)
pH, Ven: 7.43 (ref 7.25–7.43)
pO2, Ven: 44 mmHg (ref 32–45)

## 2021-07-19 LAB — CBC WITH DIFFERENTIAL/PLATELET
Abs Immature Granulocytes: 0.07 10*3/uL (ref 0.00–0.07)
Basophils Absolute: 0 10*3/uL (ref 0.0–0.1)
Basophils Relative: 0 %
Eosinophils Absolute: 0 10*3/uL (ref 0.0–0.5)
Eosinophils Relative: 0 %
HCT: 42.3 % (ref 39.0–52.0)
Hemoglobin: 15 g/dL (ref 13.0–17.0)
Immature Granulocytes: 0 %
Lymphocytes Relative: 7 %
Lymphs Abs: 1.1 10*3/uL (ref 0.7–4.0)
MCH: 29.9 pg (ref 26.0–34.0)
MCHC: 35.5 g/dL (ref 30.0–36.0)
MCV: 84.3 fL (ref 80.0–100.0)
Monocytes Absolute: 0.2 10*3/uL (ref 0.1–1.0)
Monocytes Relative: 1 %
Neutro Abs: 14.4 10*3/uL — ABNORMAL HIGH (ref 1.7–7.7)
Neutrophils Relative %: 92 %
Platelets: 246 10*3/uL (ref 150–400)
RBC: 5.02 MIL/uL (ref 4.22–5.81)
RDW: 13 % (ref 11.5–15.5)
WBC: 15.8 10*3/uL — ABNORMAL HIGH (ref 4.0–10.5)
nRBC: 0 % (ref 0.0–0.2)

## 2021-07-19 LAB — URINALYSIS, ROUTINE W REFLEX MICROSCOPIC
Bacteria, UA: NONE SEEN
Bilirubin Urine: NEGATIVE
Glucose, UA: >=500 mg/dL — AB
Ketones, ur: 80 mg/dL — AB
Leukocytes,Ua: NEGATIVE
Nitrite: NEGATIVE
Protein, ur: 100 mg/dL — AB
Specific Gravity, Urine: 1.023 (ref 1.005–1.030)
pH: 7 (ref 5.0–8.0)

## 2021-07-19 LAB — BASIC METABOLIC PANEL
Anion gap: 15 (ref 5–15)
BUN: 15 mg/dL (ref 6–20)
CO2: 22 mmol/L (ref 22–32)
Calcium: 9.4 mg/dL (ref 8.9–10.3)
Chloride: 101 mmol/L (ref 98–111)
Creatinine, Ser: 0.97 mg/dL (ref 0.61–1.24)
GFR, Estimated: >60 mL/min (ref >60)
Glucose, Bld: 281 mg/dL — ABNORMAL HIGH (ref 70–99)
Potassium: 4.3 mmol/L (ref 3.5–5.1)
Sodium: 138 mmol/L (ref 135–145)

## 2021-07-19 LAB — ETHANOL: Alcohol, Ethyl (B): <10 mg/dL (ref <10)

## 2021-07-19 LAB — CBG MONITORING, ED: Glucose-Capillary: 268 mg/dL — ABNORMAL HIGH (ref 70–99)

## 2021-07-19 LAB — BETA-HYDROXYBUTYRIC ACID: Beta-Hydroxybutyric Acid: 1.88 mmol/L — ABNORMAL HIGH (ref 0.05–0.27)

## 2021-07-19 MED ORDER — HALOPERIDOL LACTATE 5 MG/ML IJ SOLN
5.0000 mg | Freq: Once | INTRAMUSCULAR | Status: AC
  Administered 2021-07-19: 5 mg via INTRAMUSCULAR
  Filled 2021-07-19: qty 1

## 2021-07-19 MED ORDER — SODIUM CHLORIDE 0.9 % IV BOLUS
1000.0000 mL | Freq: Once | INTRAVENOUS | Status: AC
  Administered 2021-07-19: 1000 mL via INTRAVENOUS

## 2021-07-19 MED ORDER — SODIUM CHLORIDE 0.9 % IV BOLUS: 2000.0000 mL | Freq: Once | INTRAVENOUS | Status: DC

## 2021-07-19 MED ORDER — METOCLOPRAMIDE HCL 5 MG/ML IJ SOLN
10.0000 mg | Freq: Once | INTRAMUSCULAR | Status: DC
  Filled 2021-07-19: qty 2

## 2021-07-19 MED ORDER — PROMETHAZINE HCL 25 MG PO TABS: 25.0000 mg | ORAL_TABLET | Freq: Four times a day (QID) | ORAL | 0 refills | Status: DC | PRN

## 2021-07-19 MED ORDER — DIPHENHYDRAMINE HCL 50 MG/ML IJ SOLN
12.5000 mg | Freq: Once | INTRAMUSCULAR | Status: DC
  Filled 2021-07-19: qty 1

## 2021-07-19 NOTE — ED Provider Notes (Signed)
[facility]-EMERGENCY DEPT Provider Note   CSN: [id number] Arrival date & time: 07/19/21  1219     History  Chief Complaint  Patient presents with   Emesis    Mason Mclaughlin is a 26 y.o. male.  The history is provided by the patient.  Emesis Severity:  Mild Duration:  1 day Timing:  Intermittent Able to tolerate:  Liquids Progression:  Worsening Chronicity:  Recurrent Recent urination:  Normal Relieved by:  Nothing Worsened by:  Nothing Associated symptoms: no abdominal pain, no arthralgias, no chills, no cough, no diarrhea, no fever, no headaches, no myalgias, no sore throat and no URI   Risk factors: diabetes   Risk factors comment:  THC use     Home Medications Prior to Admission medications   Medication Sig Start Date End Date Taking? Authorizing Provider  famotidine (PEPCID) 20 MG tablet Take 1 tablet (20 mg total) by mouth 2 (two) times daily. 03/19/21   Jacalyn Lefevre, MD  insulin aspart (NOVOLOG) 100 UNIT/ML injection Inject 60 Units into the skin daily. Max dose 60 units - pump 11/13/19   [provider]  ondansetron (ZOFRAN ODT) 8 MG disintegrating tablet 8mg  ODT q4 hours prn nausea 02/15/21   Geoffery Lyons, MD  ondansetron (ZOFRAN) 4 MG tablet Take 1 tablet (4 mg total) by mouth every 8 (eight) hours as needed for up to 20 doses for nausea or vomiting. 02/15/21   Koleen Distance, MD  promethazine (PHENERGAN) 25 MG tablet Take 1 tablet (25 mg total) by mouth every 6 (six) hours as needed for nausea or vomiting. 07/19/21   Virgina Norfolk, DO      Allergies    Patient has no known allergies.    Review of Systems   Review of Systems  Constitutional:  Negative for chills and fever.  HENT:  Negative for sore throat.   Respiratory:  Negative for cough.   Gastrointestinal:  Positive for vomiting. Negative for abdominal pain and diarrhea.  Musculoskeletal:  Negative for arthralgias and myalgias.  Neurological:  Negative for headaches.    Physical Exam  ED Triage Vitals  Enc Vitals Group     BP 07/19/21 0921 (!) 181/111     Pulse Rate 07/19/21 0921 (!) 103     Resp 07/19/21 0921 18     Temp 07/19/21 0921 99.9 F (37.7 C)     Temp src --      SpO2 07/19/21 0921 99 %     Weight 07/19/21 0919 150 lb (68 kg)     Height 07/19/21 0919 5\' 5"  (1.651 m)     Head Circumference --      Peak Flow --      Pain Score 07/19/21 0919 7     Pain Loc --      Pain Edu? --      Excl. in GC? --     Physical Exam Vitals and nursing note reviewed.  Constitutional:      General: He is not in acute distress.    Appearance: He is well-developed. He is not ill-appearing.  HENT:     Head: Normocephalic and atraumatic.     Mouth/Throat:     Mouth: Mucous membranes are dry.  Eyes:     Extraocular Movements: Extraocular movements intact.     Conjunctiva/sclera: Conjunctivae normal.     Pupils: Pupils are equal, round, and reactive to light.  Cardiovascular:     Rate and Rhythm: Normal rate and regular rhythm.  Pulses: Normal pulses.     Heart sounds: No murmur heard. Pulmonary:     Effort: Pulmonary effort is normal. No respiratory distress.     Breath sounds: Normal breath sounds.  Abdominal:     General: Abdomen is flat. There is no distension.     Palpations: Abdomen is soft.     Tenderness: There is no abdominal tenderness.  Musculoskeletal:        General: No swelling.     Cervical back: Normal range of motion and neck supple.  Skin:    General: Skin is warm and dry.     Capillary Refill: Capillary refill takes less than 2 seconds.  Neurological:     General: No focal deficit present.     Mental Status: He is alert and oriented to person, place, and time.     Cranial Nerves: No cranial nerve deficit.     Sensory: No sensory deficit.     Motor: No weakness.     Comments: 5+ out of 5 strength throughout, normal sensation, no drift, normal finger-nose-finger, normal speech  Psychiatric:        Mood and Affect: Mood  normal.    ED Results / Procedures / Treatments   Labs (all labs ordered are listed, but only abnormal results are displayed) Labs Reviewed  CBC WITH DIFFERENTIAL/PLATELET - Abnormal; Notable for the following components:      Result Value   WBC 15.8 (*)    Neutro Abs 14.4 (*)    All other components within normal limits  BASIC METABOLIC PANEL - Abnormal; Notable for the following components:   Glucose, Bld 281 (*)    All other components within normal limits  BETA-HYDROXYBUTYRIC ACID - Abnormal; Notable for the following components:   Beta-Hydroxybutyric Acid 1.88 (*)    All other components within normal limits  BLOOD GAS, VENOUS - Abnormal; Notable for the following components:   pCO2, Ven 41 (*)    Acid-Base Excess 2.6 (*)    All other components within normal limits  URINALYSIS, ROUTINE W REFLEX MICROSCOPIC - Abnormal; Notable for the following components:   Glucose, UA >=500 (*)    Hgb urine dipstick SMALL (*)    Ketones, ur 80 (*)    Protein, ur 100 (*)    All other components within normal limits  CBG MONITORING, ED - Abnormal; Notable for the following components:   Glucose-Capillary 268 (*)    All other components within normal limits  RESP PANEL BY RT-PCR (FLU A&B, COVID) ARPGX2  ETHANOL    EKG EKG Interpretation  Date/Time:  Sunday July 19 2021 09:58:01 EST Ventricular Rate:  98 PR Interval:  114 QRS Duration: 80 QT Interval:  338 QTC Calculation: 432 R Axis:   73 Text Interpretation: Sinus rhythm Borderline short PR interval LVH by voltage Confirmed by Virgina Norfolk ([id number]) on 07/19/2021 10:03:26 AM  Radiology DG Chest Portable 1 View  Result Date: 07/19/2021 CLINICAL DATA:  Weakness.  Diffuse abdominal pain.  Vomiting. EXAM: PORTABLE CHEST 1 VIEW COMPARISON:  05/05/2020 FINDINGS: The heart size and mediastinal contours are within normal limits. Both lungs are clear. The visualized skeletal structures are unremarkable. IMPRESSION: No active disease.  Electronically Signed   By: Norva Pavlov M.D.   On: 07/19/2021 11:08    Procedures Procedures    Medications Ordered in ED Medications  haloperidol lactate (HALDOL) injection 5 mg (5 mg Intramuscular Given 07/19/21 1003)  sodium chloride 0.9 % bolus 1,000 mL (1,000 mLs Intravenous New  Bag/Given 07/19/21 1049)    ED Course/ Medical Decision Making/ A&P                           Medical Decision Making Amount and/or Complexity of Data Reviewed Labs: ordered. Radiology: ordered.  Risk Prescription drug management.   Mason Mclaughlin is here with nausea and vomiting.  Patient with high blood pressure, otherwise unremarkable vitals.  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  Differential diagnosis includes both DKA, gastroparesis, hyperemesis from marijuana use. Also admits to heavy ETOH use last night too. Patient overall has no abdominal tenderness on exam.  He appears slightly dehydrated on exam.  Denies any suspicious food intake.  Could be viral process.  We will get CBC, CMP, blood gas, chest x-ray, ketones, urinalysis.  Will give 2 L fluid bolus.  Blood sugar 268.  Will give IV Reglan IV Benadryl and reevaluate.  No concern for intra-abdominal process at this time.  Lab work per my interpretation shows no significant findings.  Blood sugar is mildly elevated but he is not in DKA.  Urinalysis negative for infection.  Mild leukocytosis but no significant anemia.  Creatinine unremarkable.  Feeling better after medications and fluids.  Discharged in good condition.  Overall suspect alcohol related discomfort or gastroparesis/hyperemesis from marijuana.  This chart was dictated using voice recognition software.  Despite best efforts to proofread,  errors can occur which can change the documentation meaning.         Final Clinical Impression(s) / ED Diagnoses Final diagnoses:  Vomiting without nausea, unspecified vomiting type    Rx / DC Orders ED Discharge  Orders          Ordered    promethazine (PHENERGAN) 25 MG tablet  Every 6 hours PRN        07/19/21 1128              Virgina Norfolk, DO 07/19/21 1129

## 2021-07-19 NOTE — ED Triage Notes (Signed)
Pt states he has been vomiting since yesterday. Pt also reports diffuse abdominal pain.

## 2022-01-16 ENCOUNTER — Inpatient Hospital Stay (HOSPITAL_COMMUNITY)
Admission: EM | Admit: 2022-01-16 | Discharge: 2022-01-18 | DRG: 638 | Discharge status: Against Medical Advice | Payer: BC Managed Care – PPO | Attending: Internal Medicine | Admitting: Internal Medicine

## 2022-01-16 ENCOUNTER — Emergency Department (HOSPITAL_COMMUNITY): Payer: BC Managed Care – PPO

## 2022-01-16 ENCOUNTER — Other Ambulatory Visit: Payer: Self-pay

## 2022-01-16 ENCOUNTER — Encounter (HOSPITAL_COMMUNITY): Payer: Self-pay

## 2022-01-16 DIAGNOSIS — K3184 Gastroparesis: Secondary | ICD-10-CM | POA: Diagnosis present

## 2022-01-16 DIAGNOSIS — R6510 Systemic inflammatory response syndrome (SIRS) of non-infectious origin without acute organ dysfunction: Secondary | ICD-10-CM | POA: Diagnosis present

## 2022-01-16 DIAGNOSIS — E1143 Type 2 diabetes mellitus with diabetic autonomic (poly)neuropathy: Secondary | ICD-10-CM | POA: Diagnosis present

## 2022-01-16 DIAGNOSIS — R112 Nausea with vomiting, unspecified: Secondary | ICD-10-CM | POA: Diagnosis present

## 2022-01-16 DIAGNOSIS — E1110 Type 2 diabetes mellitus with ketoacidosis without coma: Secondary | ICD-10-CM | POA: Diagnosis present

## 2022-01-16 DIAGNOSIS — E871 Hypo-osmolality and hyponatremia: Secondary | ICD-10-CM | POA: Diagnosis present

## 2022-01-16 DIAGNOSIS — I10 Essential (primary) hypertension: Secondary | ICD-10-CM | POA: Diagnosis present

## 2022-01-16 DIAGNOSIS — Z9641 Presence of insulin pump (external) (internal): Secondary | ICD-10-CM | POA: Diagnosis present

## 2022-01-16 DIAGNOSIS — E869 Volume depletion, unspecified: Secondary | ICD-10-CM | POA: Diagnosis present

## 2022-01-16 DIAGNOSIS — Z794 Long term (current) use of insulin: Secondary | ICD-10-CM

## 2022-01-16 DIAGNOSIS — E1010 Type 1 diabetes mellitus with ketoacidosis without coma: Secondary | ICD-10-CM | POA: Diagnosis not present

## 2022-01-16 DIAGNOSIS — E872 Acidosis, unspecified: Secondary | ICD-10-CM | POA: Diagnosis not present

## 2022-01-16 DIAGNOSIS — E109 Type 1 diabetes mellitus without complications: Secondary | ICD-10-CM | POA: Diagnosis present

## 2022-01-16 DIAGNOSIS — E1159 Type 2 diabetes mellitus with other circulatory complications: Secondary | ICD-10-CM | POA: Diagnosis present

## 2022-01-16 DIAGNOSIS — E119 Type 2 diabetes mellitus without complications: Secondary | ICD-10-CM | POA: Diagnosis present

## 2022-01-16 DIAGNOSIS — E1043 Type 1 diabetes mellitus with diabetic autonomic (poly)neuropathy: Secondary | ICD-10-CM | POA: Diagnosis present

## 2022-01-16 DIAGNOSIS — Z833 Family history of diabetes mellitus: Secondary | ICD-10-CM

## 2022-01-16 DIAGNOSIS — Z79899 Other long term (current) drug therapy: Secondary | ICD-10-CM

## 2022-01-16 LAB — I-STAT CHEM 8, ED
BUN: 13 mg/dL (ref 6–20)
Calcium, Ion: 1.1 mmol/L — ABNORMAL LOW (ref 1.15–1.40)
Chloride: 101 mmol/L (ref 98–111)
Creatinine, Ser: 0.8 mg/dL (ref 0.61–1.24)
Glucose, Bld: 295 mg/dL — ABNORMAL HIGH (ref 70–99)
HCT: 45 % (ref 39.0–52.0)
Hemoglobin: 15.3 g/dL (ref 13.0–17.0)
Potassium: 5.1 mmol/L (ref 3.5–5.1)
Sodium: 137 mmol/L (ref 135–145)
TCO2: 25 mmol/L (ref 22–32)

## 2022-01-16 LAB — BLOOD GAS, VENOUS
Acid-Base Excess: 4.6 mmol/L — ABNORMAL HIGH (ref 0.0–2.0)
Bicarbonate: 28.3 mmol/L — ABNORMAL HIGH (ref 20.0–28.0)
O2 Saturation: 81.7 %
Patient temperature: 37.1
pCO2, Ven: 38 mmHg — ABNORMAL LOW (ref 44–60)
pH, Ven: 7.48 — ABNORMAL HIGH (ref 7.25–7.43)
pO2, Ven: 50 mmHg — ABNORMAL HIGH (ref 32–45)

## 2022-01-16 LAB — ETHANOL: Alcohol, Ethyl (B): <10 mg/dL (ref <10)

## 2022-01-16 LAB — CBC WITH DIFFERENTIAL/PLATELET
Abs Immature Granulocytes: 0.07 10*3/uL (ref 0.00–0.07)
Basophils Absolute: 0.1 10*3/uL (ref 0.0–0.1)
Basophils Relative: 0 %
Eosinophils Absolute: 0 10*3/uL (ref 0.0–0.5)
Eosinophils Relative: 0 %
HCT: 42.2 % (ref 39.0–52.0)
Hemoglobin: 15 g/dL (ref 13.0–17.0)
Immature Granulocytes: 0 %
Lymphocytes Relative: 11 %
Lymphs Abs: 1.9 10*3/uL (ref 0.7–4.0)
MCH: 30.4 pg (ref 26.0–34.0)
MCHC: 35.5 g/dL (ref 30.0–36.0)
MCV: 85.6 fL (ref 80.0–100.0)
Monocytes Absolute: 0.4 10*3/uL (ref 0.1–1.0)
Monocytes Relative: 2 %
Neutro Abs: 15 10*3/uL — ABNORMAL HIGH (ref 1.7–7.7)
Neutrophils Relative %: 87 %
Platelets: 312 10*3/uL (ref 150–400)
RBC: 4.93 MIL/uL (ref 4.22–5.81)
RDW: 12.3 % (ref 11.5–15.5)
WBC: 17.4 10*3/uL — ABNORMAL HIGH (ref 4.0–10.5)
nRBC: 0 % (ref 0.0–0.2)

## 2022-01-16 LAB — COMPREHENSIVE METABOLIC PANEL
ALT: 31 U/L (ref 0–44)
AST: 29 U/L (ref 15–41)
Albumin: 4.3 g/dL (ref 3.5–5.0)
Alkaline Phosphatase: 108 U/L (ref 38–126)
Anion gap: 12 (ref 5–15)
BUN: 12 mg/dL (ref 6–20)
CO2: 26 mmol/L (ref 22–32)
Calcium: 9.8 mg/dL (ref 8.9–10.3)
Chloride: 99 mmol/L (ref 98–111)
Creatinine, Ser: 1.11 mg/dL (ref 0.61–1.24)
GFR, Estimated: >60 mL/min (ref >60)
Glucose, Bld: 288 mg/dL — ABNORMAL HIGH (ref 70–99)
Potassium: 3.7 mmol/L (ref 3.5–5.1)
Sodium: 137 mmol/L (ref 135–145)
Total Bilirubin: 1 mg/dL (ref 0.3–1.2)
Total Protein: 8.5 g/dL — ABNORMAL HIGH (ref 6.5–8.1)

## 2022-01-16 LAB — OSMOLALITY: Osmolality: 306 mOsm/kg — ABNORMAL HIGH (ref 275–295)

## 2022-01-16 LAB — MAGNESIUM: Magnesium: 1.7 mg/dL (ref 1.7–2.4)

## 2022-01-16 LAB — BETA-HYDROXYBUTYRIC ACID
Beta-Hydroxybutyric Acid: 1.52 mmol/L — ABNORMAL HIGH (ref 0.05–0.27)
Beta-Hydroxybutyric Acid: 1.95 mmol/L — ABNORMAL HIGH (ref 0.05–0.27)

## 2022-01-16 LAB — GLUCOSE, CAPILLARY
Glucose-Capillary: 218 mg/dL — ABNORMAL HIGH (ref 70–99)
Glucose-Capillary: 257 mg/dL — ABNORMAL HIGH (ref 70–99)
Glucose-Capillary: 291 mg/dL — ABNORMAL HIGH (ref 70–99)
Glucose-Capillary: 301 mg/dL — ABNORMAL HIGH (ref 70–99)

## 2022-01-16 LAB — RAPID URINE DRUG SCREEN, HOSP PERFORMED
Amphetamines: NOT DETECTED
Barbiturates: NOT DETECTED
Benzodiazepines: NOT DETECTED
Cocaine: NOT DETECTED
Opiates: NOT DETECTED
Tetrahydrocannabinol: NOT DETECTED

## 2022-01-16 LAB — LACTIC ACID, PLASMA
Lactic Acid, Venous: 3.3 mmol/L (ref 0.5–1.9)
Lactic Acid, Venous: 3.5 mmol/L (ref 0.5–1.9)
Lactic Acid, Venous: 2.1 mmol/L (ref 0.5–1.9)

## 2022-01-16 LAB — TSH: TSH: 2.111 u[IU]/mL (ref 0.350–4.500)

## 2022-01-16 LAB — URINALYSIS, ROUTINE W REFLEX MICROSCOPIC
Bacteria, UA: NONE SEEN
Bilirubin Urine: NEGATIVE
Glucose, UA: >=500 mg/dL — AB
Ketones, ur: 80 mg/dL — AB
Leukocytes,Ua: NEGATIVE
Nitrite: NEGATIVE
Protein, ur: 100 mg/dL — AB
Specific Gravity, Urine: 1.016 (ref 1.005–1.030)
pH: 8 (ref 5.0–8.0)

## 2022-01-16 LAB — AMMONIA: Ammonia: 14 umol/L (ref 9–35)

## 2022-01-16 LAB — CBG MONITORING, ED
Glucose-Capillary: 294 mg/dL — ABNORMAL HIGH (ref 70–99)
Glucose-Capillary: 201 mg/dL — ABNORMAL HIGH (ref 70–99)
Glucose-Capillary: 248 mg/dL — ABNORMAL HIGH (ref 70–99)

## 2022-01-16 LAB — CK: Total CK: 277 U/L (ref 49–397)

## 2022-01-16 LAB — ACETAMINOPHEN LEVEL: Acetaminophen (Tylenol), Serum: <10 ug/mL — ABNORMAL LOW (ref 10–30)

## 2022-01-16 LAB — LIPASE, BLOOD: Lipase: 22 U/L (ref 11–51)

## 2022-01-16 LAB — PHOSPHORUS: Phosphorus: 1.7 mg/dL — ABNORMAL LOW (ref 2.5–4.6)

## 2022-01-16 LAB — SALICYLATE LEVEL: Salicylate Lvl: <7 mg/dL — ABNORMAL LOW (ref 7.0–30.0)

## 2022-01-16 MED ORDER — ACETAMINOPHEN 650 MG RE SUPP: 650.0000 mg | Freq: Four times a day (QID) | RECTAL | Status: DC | PRN

## 2022-01-16 MED ORDER — LACTATED RINGERS IV BOLUS: 20.0000 mL/kg | Freq: Once | INTRAVENOUS | Status: AC

## 2022-01-16 MED ORDER — LISINOPRIL 10 MG PO TABS
10.0000 mg | ORAL_TABLET | Freq: Every morning | ORAL | Status: DC
  Administered 2022-01-17: 10 mg via ORAL
  Filled 2022-01-16: qty 1

## 2022-01-16 MED ORDER — METOPROLOL TARTRATE 5 MG/5ML IV SOLN
5.0000 mg | Freq: Once | INTRAVENOUS | Status: AC
  Administered 2022-01-16: 5 mg via INTRAVENOUS
  Filled 2022-01-16: qty 5

## 2022-01-16 MED ORDER — ONDANSETRON HCL 4 MG PO TABS: 4.0000 mg | ORAL_TABLET | Freq: Four times a day (QID) | ORAL | Status: DC | PRN

## 2022-01-16 MED ORDER — POTASSIUM CHLORIDE IN NACL 20-0.9 MEQ/L-% IV SOLN
INTRAVENOUS | Status: DC
  Filled 2022-01-16: qty 1000

## 2022-01-16 MED ORDER — HYDRALAZINE HCL 20 MG/ML IJ SOLN
10.0000 mg | Freq: Once | INTRAMUSCULAR | Status: AC
  Administered 2022-01-16: 10 mg via INTRAVENOUS
  Filled 2022-01-16: qty 1

## 2022-01-16 MED ORDER — LACTATED RINGERS IV BOLUS
1000.0000 mL | Freq: Once | INTRAVENOUS | Status: AC
  Administered 2022-01-16: 1000 mL via INTRAVENOUS

## 2022-01-16 MED ORDER — INSULIN PUMP
SUBCUTANEOUS | Status: DC
  Filled 2022-01-16: qty 1

## 2022-01-16 MED ORDER — INSULIN ASPART 100 UNIT/ML IJ SOLN
0.0000 [IU] | Freq: Three times a day (TID) | INTRAMUSCULAR | Status: DC
  Administered 2022-01-16: 5 [IU] via SUBCUTANEOUS

## 2022-01-16 MED ORDER — METOCLOPRAMIDE HCL 5 MG/ML IJ SOLN
10.0000 mg | Freq: Four times a day (QID) | INTRAMUSCULAR | Status: DC
Start: 2022-01-16 — End: 2022-01-18
  Administered 2022-01-16 – 2022-01-18 (×8): 10 mg via INTRAVENOUS
  Filled 2022-01-16 (×8): qty 2

## 2022-01-16 MED ORDER — POTASSIUM CHLORIDE IN NACL 20-0.45 MEQ/L-% IV SOLN
INTRAVENOUS | Status: DC
  Filled 2022-01-16 (×3): qty 1000

## 2022-01-16 MED ORDER — MELATONIN 3 MG PO TABS
3.0000 mg | ORAL_TABLET | Freq: Once | ORAL | Status: AC
  Administered 2022-01-16: 3 mg via ORAL
  Filled 2022-01-16: qty 1

## 2022-01-16 MED ORDER — ACETAMINOPHEN 325 MG PO TABS: 650.0000 mg | ORAL_TABLET | Freq: Four times a day (QID) | ORAL | Status: DC | PRN

## 2022-01-16 MED ORDER — DROPERIDOL 2.5 MG/ML IJ SOLN
1.2500 mg | Freq: Once | INTRAMUSCULAR | Status: AC
  Administered 2022-01-16: 1.25 mg via INTRAVENOUS
  Filled 2022-01-16: qty 2

## 2022-01-16 MED ORDER — ONDANSETRON HCL 4 MG/2ML IJ SOLN
4.0000 mg | Freq: Once | INTRAMUSCULAR | Status: AC
  Administered 2022-01-16: 4 mg via INTRAVENOUS
  Filled 2022-01-16: qty 2

## 2022-01-16 MED ORDER — DIPHENHYDRAMINE HCL 50 MG/ML IJ SOLN
12.5000 mg | Freq: Once | INTRAMUSCULAR | Status: AC
  Administered 2022-01-16: 12.5 mg via INTRAVENOUS
  Filled 2022-01-16: qty 1

## 2022-01-16 MED ORDER — METOPROLOL TARTRATE 5 MG/5ML IV SOLN
5.0000 mg | Freq: Three times a day (TID) | INTRAVENOUS | Status: DC
  Administered 2022-01-16 – 2022-01-18 (×5): 5 mg via INTRAVENOUS
  Filled 2022-01-16 (×5): qty 5

## 2022-01-16 MED ORDER — ONDANSETRON HCL 4 MG/2ML IJ SOLN
4.0000 mg | Freq: Four times a day (QID) | INTRAMUSCULAR | Status: DC | PRN
  Administered 2022-01-16 – 2022-01-18 (×4): 4 mg via INTRAVENOUS
  Filled 2022-01-16 (×5): qty 2

## 2022-01-16 MED ORDER — PANTOPRAZOLE SODIUM 40 MG IV SOLR
40.0000 mg | Freq: Once | INTRAVENOUS | Status: AC
Start: 2022-01-16 — End: 2022-01-16
  Administered 2022-01-16: 40 mg via INTRAVENOUS
  Filled 2022-01-16: qty 10

## 2022-01-16 MED ORDER — SODIUM CHLORIDE 0.9 % IV BOLUS
1000.0000 mL | Freq: Once | INTRAVENOUS | Status: AC
  Administered 2022-01-16: 1000 mL via INTRAVENOUS

## 2022-01-16 MED ORDER — SODIUM CHLORIDE 0.45 % IV BOLUS
1000.0000 mL | Freq: Once | INTRAVENOUS | Status: AC
  Administered 2022-01-16: 500 mL via INTRAVENOUS

## 2022-01-16 MED ORDER — METOCLOPRAMIDE HCL 5 MG/ML IJ SOLN
10.0000 mg | Freq: Once | INTRAMUSCULAR | Status: AC
  Administered 2022-01-16: 10 mg via INTRAVENOUS
  Filled 2022-01-16: qty 2

## 2022-01-16 MED ORDER — HYDRALAZINE HCL 20 MG/ML IJ SOLN
20.0000 mg | Freq: Four times a day (QID) | INTRAMUSCULAR | Status: DC | PRN
  Administered 2022-01-17: 20 mg via INTRAVENOUS
  Filled 2022-01-16: qty 1

## 2022-01-16 NOTE — ED Triage Notes (Signed)
Type 1 diabetic.   Difficult to wake up in triage. Pt sts he has been vomiting tonight and diabetic.   Cbg in triage 294.   Insulin pump LLQ abdomen.

## 2022-01-16 NOTE — ED Provider Notes (Signed)
Care assumed from previous provider PA Arthor Captain. Please see note for further details.  In short, patient is a 26 year old type I diabetic who was lethargic on arrival.  Did endorse abdominal pain.  CT abdomen pelvis pending at shift change.  We will follow-up on this however patient is likely going to require admission.  No signs of DKA, patient shows a metabolic alkalosis which is likely secondary to vomiting.  His lactic acid is trending up.  This is likely gastroparesis however CT pending at this time.  On reevaluation, patient is resting comfortably.  We will continue to await CT, UDS and dispo accordingly.   Physical Exam  BP (!) 183/108   Pulse (!) 113   Temp 98.3 F (36.8 C) (Oral)   Resp 19   Wt 68 kg   SpO2 98%   BMI 24.95 kg/m   Physical Exam Vitals and nursing note reviewed.  Constitutional:      Appearance: Normal appearance.  HENT:     Head: Normocephalic and atraumatic.  Eyes:     General: No scleral icterus.    Conjunctiva/sclera: Conjunctivae normal.  Cardiovascular:     Rate and Rhythm: Regular rhythm. Tachycardia present.  Pulmonary:     Effort: Pulmonary effort is normal. No respiratory distress.  Skin:    Findings: No rash.  Neurological:     Mental Status: He is alert.  Psychiatric:        Mood and Affect: Mood normal.    Procedures  Procedures  ED Course / MDM   Clinical Course as of 01/16/22 1004  Sat Jan 16, 2022  0642 Lactic Acid, Venous(!!): 3.5 [AH]  [id number] Spoke with Dr. Robb Matar, patient will be admitted [MR]    Clinical Course User Index [AH] Arthor Captain, PA-C [MR] Veronica Guerrant, Gabriel Cirri, PA-C   Medical Decision Making Amount and/or Complexity of Data Reviewed Labs: ordered. Decision-making details documented in ED Course. Radiology: ordered.  Risk Prescription drug management. Decision regarding hospitalization.    On reevaluation patient is asleep.  Arousable and remains in sinus tachycardia.  CT viewed and interpreted by me  without explanations of patient's symptoms.  UDS negative.  Oriented x3, however I do believe patient needs admission for metabolic alkalosis, potentially in the setting of gastroparesis.  We will give pantoprazole and Reglan for remaining nausea and hiccups and admit to the hospital.      Saddie Benders, PA-C 01/16/22 1021    Bradford Woods, Rienzi K, DO 01/16/22 1028

## 2022-01-16 NOTE — H&P (Signed)
History and Physical    Patient: Mason Mclaughlin WTU:[id number] DOB: November 19, 1995 DOA: 01/16/2022 DOS: the patient was seen and examined on 01/16/2022 PCP: Harvel Quale, MD  Patient coming from: Home  Chief Complaint:  Chief Complaint  Patient presents with   Vomiting   HPI: [name] is a 26 y.o. male with medical history significant of type II on diabetes on insulin pump, history of DKA episodes, history intractable nausea and vomiting, history of AKI, hypertension who drove himself to the emergency department due to nausea associated with multiple episodes of emesis, abdominal pain, fatigue and malaise while he was at work shortly after eating food from a local [employer] place.  No travel history or sick contacts.  No diarrhea, constipation, melena or hematochezia.  No flank pain, dysuria, frequency or hematuria.  He denied fever, chills, rhinorrhea, sore throat, wheezing or hemoptysis.  No chest pain, palpitations, diaphoresis, PND, orthopnea or pitting edema of the lower extremities. No polyuria, polydipsia, polyphagia or blurred vision.  He has been compliant with his use of insulin.  ED course: Initial vital signs were temperature 98.5 F, pulse 125, respiration 16, BP 219/124 mmHg and O2 sat 100% on room air.  The patient received 1000 mL normal saline bolus, 20 mL x 68 kg LR bolus, 1000 mL of LR bolus, hydralazine 10 mg IVP, droperidol 1.25 mg IVP, diphenhydramine 12.5 mg IVP x1, metoclopramide 10 mg IVP, Zofran 4 mg IVP and pantoprazole 40 mg IVP.  Lab work: UDS is negative.  His urinalysis showed glucosuria more than 500, ketonuria of 80 and proteinuria 100 mg/dL.  There was moderate hemoglobinuria.  No significant finding on urine microscopic examination.  CBC showed a white count of 17.4, hemoglobin 15 g/dL platelets 179.  Venous blood gas showed a pH of 7.48, PCO2 of 38 and PaO2 of 50 mmHg.  Bicarbonate was 28.3 and acid-base status of 4.6 mmol/L.  Lactic acid 3.3 and then  3.5 mmol/L.  CMP showed a glucose of 288 mg/dL and total protein 8.5 g/dL.  The rest of the CMP resulted values are unremarkable.  Normal lipase, total CK, TSH, salicylate alcohol and acetaminophen level.  Imaging: Portable 1 view chest radiograph was negative.  No acute findings on CT head.  CT abdomen/pelvis showed mild hepatic asteatosis, small hiatal hernia, a stable left lower lobe 7 mm nodule, but no acute abnormality.  Review of Systems: As mentioned in the history of present illness. All other systems reviewed and are negative.  Past Medical History:  Diagnosis Date   Diabetes (HCC)    DKA (diabetic ketoacidoses)    DKA, type 1 (HCC)    Insulin pump in place    History reviewed. No pertinent surgical history. Social History:  reports that he has never smoked. He has never used smokeless tobacco. He reports current alcohol use. He reports current drug use. Frequency: 4.00 times per week. Drug: Marijuana.  No Known Allergies  Family History  Problem Relation Age of Onset   Healthy Mother    Diabetes Father     Prior to Admission medications   Medication Sig Start Date End Date Taking? Authorizing Provider  famotidine (PEPCID) 20 MG tablet Take 1 tablet (20 mg total) by mouth 2 (two) times daily. 03/19/21   Jacalyn Lefevre, MD  insulin aspart (NOVOLOG) 100 UNIT/ML injection Inject 60 Units into the skin daily. Max dose 60 units - pump 11/13/19   [provider]  lisinopril (ZESTRIL) 10 MG tablet Take 10 mg by mouth  daily. 12/07/21   [provider]  ondansetron (ZOFRAN ODT) 8 MG disintegrating tablet 8mg  ODT q4 hours prn nausea 02/15/21   02/17/21, MD  ondansetron (ZOFRAN) 4 MG tablet Take 1 tablet (4 mg total) by mouth every 8 (eight) hours as needed for up to 20 doses for nausea or vomiting. 02/15/21   02/17/21, MD  promethazine (PHENERGAN) 25 MG tablet Take 1 tablet (25 mg total) by mouth every 6 (six) hours as needed for nausea or vomiting. 07/19/21    07/21/21, DO    Physical Exam: Vitals:   01/16/22 0800 01/16/22 0845 01/16/22 0930 01/16/22 0945  BP: (!) 170/93 (!) 163/97 (!) 177/96 (!) 170/94  Pulse: (!) 110 (!) 114 (!) 111 (!) 116  Resp: (!) 25 16 15  (!) 23  Temp:   98.2 F (36.8 C)   TempSrc:      SpO2: 99% 99% 100% 99%  Weight:       Physical Exam Vitals and nursing note reviewed.  Constitutional:      Appearance: Normal appearance. He is normal weight. He is not ill-appearing.  HENT:     Head: Normocephalic.     Nose: No rhinorrhea.     Mouth/Throat:     Mouth: Mucous membranes are dry.  Eyes:     General: No scleral icterus.    Pupils: Pupils are equal, round, and reactive to light.  Neck:     Vascular: No JVD.  Cardiovascular:     Rate and Rhythm: Regular rhythm. Tachycardia present.     Heart sounds: S1 normal and S2 normal.  Pulmonary:     Effort: Pulmonary effort is normal.     Breath sounds: Normal breath sounds. No wheezing, rhonchi or rales.  Abdominal:     General: Bowel sounds are normal. There is no distension.     Palpations: Abdomen is soft.     Tenderness: There is abdominal tenderness in the epigastric area. There is no right CVA tenderness, left CVA tenderness, guarding or rebound.  Musculoskeletal:     Cervical back: Neck supple.     Right lower leg: No edema.     Left lower leg: No edema.  Skin:    General: Skin is warm and dry.  Neurological:     General: No focal deficit present.     Mental Status: He is alert and oriented to person, place, and time.  Psychiatric:        Mood and Affect: Mood normal.        Behavior: Behavior normal. Behavior is cooperative.   Data Reviewed:  Results are pending, will review when available.  Assessment and Plan: Principal Problem:   Lactic acidosis In the setting of:   SIRS (systemic inflammatory response syndrome) (HCC) With secondary   Volume depletion, gastrointestinal loss Observation/PCU Continue IV fluids. 0.45% NaCl  bolus. Continue insulin pump use. Monitor CBG closely. Recheck lactic acid and BHA.  Active Problems:   Nausea and vomiting In the setting of:   Diabetic gastroparesis (HCC) Exacerbated by gastroenteritis Keep NPO. Then clear liquids. Continue IV fluids. Analgesics as needed. Antiemetics as needed. Pantoprazole 40 mg IVP every 24 hours. Keep electrolytes optimized. Follow-up CBC and CMP in AM.    Type 1 diabetes mellitus not at goal [facility]) Currently on clear liquid diet. Advance to carbohydrate modified as tolerated. Continue insulin/CBG checks per inpatient insulin pump protocol.    Hypertension complicating diabetes (HCC) Continue lisinopril 10 mg p.o. daily. Metoprolol 5 mg  IVP x1 given.     Advance Care Planning:   Code Status: Prior   Consults:   Family Communication:   Severity of Illness: The appropriate patient status for this patient is OBSERVATION. Observation status is judged to be reasonable and necessary in order to provide the required intensity of service to ensure the patient's safety. The patient's presenting symptoms, physical exam findings, and initial radiographic and laboratory data in the context of their medical condition is felt to place them at decreased risk for further clinical deterioration. Furthermore, it is anticipated that the patient will be medically stable for discharge from the hospital within 2 midnights of admission.   Author: Bobette Mo, MD 01/16/2022 9:58 AM  For on call review [email or web address].   This document was prepared using Dragon voice recognition software and may contain some unintended transcription errors.

## 2022-01-16 NOTE — ED Provider Notes (Signed)
[facility]-EMERGENCY DEPT Provider Note   CSN: [id number] Arrival date & time: 01/16/22  0330     History  Chief Complaint  Patient presents with   Vomiting    Mason Mclaughlin is a 26 y.o. male with a past medical history of type 1 diabetes who presents with vomiting.  Patient drove himself to the hospital but is lethargic and history is limited due to vomiting and the fact that the patient will only nod his head yes or no to questions.  Review of EMR shows that the patient has been admitted in the past for episodes of gastroparesis and vomiting.  Patient nods his head yes to having abdominal pain, nausea and vomiting.  He nods yes to a history of hypertension as he is notably hypertensive.  Patient nods yes when he is asked if he knows where he is currently.  Patient nods no to headache.  Patient also nods no to use of alcohol or drugs this evening. HPI     Home Medications Prior to Admission medications   Medication Sig Start Date End Date Taking? Authorizing Provider  famotidine (PEPCID) 20 MG tablet Take 1 tablet (20 mg total) by mouth 2 (two) times daily. 03/19/21   Jacalyn Lefevre, MD  insulin aspart (NOVOLOG) 100 UNIT/ML injection Inject 60 Units into the skin daily. Max dose 60 units - pump 11/13/19   [provider]  lisinopril (ZESTRIL) 10 MG tablet Take 10 mg by mouth daily. 12/07/21   [provider]  ondansetron (ZOFRAN ODT) 8 MG disintegrating tablet 8mg  ODT q4 hours prn nausea 02/15/21   02/17/21, MD  ondansetron (ZOFRAN) 4 MG tablet Take 1 tablet (4 mg total) by mouth every 8 (eight) hours as needed for up to 20 doses for nausea or vomiting. 02/15/21   02/17/21, MD  promethazine (PHENERGAN) 25 MG tablet Take 1 tablet (25 mg total) by mouth every 6 (six) hours as needed for nausea or vomiting. 07/19/21   07/21/21, DO      Allergies    Patient has no known allergies.    Review of Systems   Review of  Systems  Physical Exam Updated Vital Signs BP (!) 197/113   Pulse (!) 117   Temp 98.5 F (36.9 C)   Resp 14   SpO2 99%  Physical Exam Vitals and nursing note reviewed.  Constitutional:      General: He is not in acute distress.    Appearance: He is well-developed. He is not diaphoretic.     Comments: Actively vomiting  HENT:     Head: Normocephalic and atraumatic.     Mouth/Throat:     Mouth: Mucous membranes are dry.  Eyes:     General: No scleral icterus.    Extraocular Movements: Extraocular movements intact.     Conjunctiva/sclera: Conjunctivae normal.     Pupils: Pupils are equal, round, and reactive to light.  Cardiovascular:     Rate and Rhythm: Regular rhythm. Tachycardia present.     Heart sounds: Normal heart sounds.  Pulmonary:     Effort: Pulmonary effort is normal. No respiratory distress.     Breath sounds: Normal breath sounds.  Abdominal:     General: There is no distension.     Palpations: Abdomen is soft.     Tenderness: There is abdominal tenderness.  Musculoskeletal:     Cervical back: Normal range of motion and neck supple.  Skin:    General: Skin is warm  and dry.  Psychiatric:        Behavior: Behavior normal.     ED Results / Procedures / Treatments   Labs (all labs ordered are listed, but only abnormal results are displayed) Labs Reviewed  CBG MONITORING, ED - Abnormal; Notable for the following components:      Result Value   Glucose-Capillary 294 (*)    All other components within normal limits  BETA-HYDROXYBUTYRIC ACID  BETA-HYDROXYBUTYRIC ACID  BETA-HYDROXYBUTYRIC ACID  CBC WITH DIFFERENTIAL/PLATELET  URINALYSIS, ROUTINE W REFLEX MICROSCOPIC  BLOOD GAS, VENOUS  COMPREHENSIVE METABOLIC PANEL  OSMOLALITY  RAPID URINE DRUG SCREEN, HOSP PERFORMED  ETHANOL  LACTIC ACID, PLASMA  LACTIC ACID, PLASMA  AMMONIA  CBG MONITORING, ED  I-STAT CHEM 8, ED    EKG None  Radiology No results found.  Procedures .Critical  Care  Performed by: Arthor CaptainHarris, Roquel Burgin, PA-C Authorized by: Arthor CaptainHarris, Kassem Kibbe, PA-C   Critical care provider statement:    Critical care time (minutes):  60   Critical care time was exclusive of:  Separately billable procedures and treating other patients   Critical care was necessary to treat or prevent imminent or life-threatening deterioration of the following conditions:  Metabolic crisis and dehydration   Critical care was time spent personally by me on the following activities:  Development of treatment plan with patient or surrogate, discussions with consultants, evaluation of patient's response to treatment, examination of patient, ordering and review of laboratory studies, ordering and review of radiographic studies, ordering and performing treatments and interventions, pulse oximetry, re-evaluation of patient's condition and review of old charts     Medications Ordered in ED Medications  lactated ringers bolus 20 mL/kg (has no administration in time range)  ondansetron (ZOFRAN) injection 4 mg (has no administration in time range)    ED Course/ Medical Decision Making/ A&P Clinical Course as of 01/16/22 1627  Sat Jan 16, 2022  0642 Lactic Acid, Venous(!!): 3.5 [AH]  [id number] Spoke with Dr. Robb Matarrtiz, patient will be admitted [MR]    Clinical Course User Index [AH] Arthor CaptainHarris, Ashlin Hidalgo, PA-C [MR] Redwine, Gabriel CirriMadison A, PA-C                           Medical Decision Making 26 y/o Type I diabetic male with a pmh gastroparesis who presents  w/ vomiting. The emergent differential diagnosis for vomiting includes, but is not limited to ACS/MI, DKA, Ischemic bowel, Meningitis, Sepsis, Acute gastric dilation, Adrenal insufficiency, Appendicitis,  Bowel obstruction/ileus, Carbon monoxide poisoning, Cholecystitis, Electrolyte abnormalities, Elevated ICP, Gastric outlet obstruction, Pancreatitis, Ruptured viscus, Biliary colic, Cannabinoid hyperemesis syndrome, Gastritis, Gastroenteritis, Gastroparesis,   Narcotic withdrawal, Peptic ulcer disease, and UTI.  After review of all data points patient appears to have gastroparesis flare. Labs-He has ketones in his urine, but vbg shows metabolic alkalosis- likely hypochloremic due to gastric loss. He has an elevated lactic acid again supporting dehydration. Patient his persistently hypertensive and tahcycardic, however his CT head  which I independently interpreted shows no evidence of ICH/ SAH. CXR also reassuring  Critical interventions- patient given high volume fluid resuscitation and Droperidol which has stopped his nausea and vomiting. ([id number] on ekg)  Patient's lactic acid elevated despite fluids- he does have Leukocystosis and tachycadia, however I have low suspicion for sepsis- but have added on CT abd and pelvis to r/o other etiology of vomiting.  Patient is critically ill and will require admission - hand off at shift change to PA Redwine.  Amount and/or Complexity of Data Reviewed Labs: ordered. Decision-making details documented in ED Course. Radiology: ordered.  Risk Prescription drug management. Decision regarding hospitalization.           Final Clinical Impression(s) / ED Diagnoses Final diagnoses:  None    Rx / DC Orders ED Discharge Orders     None         Arthor Captain, PA-C 01/16/22 1644    Gilda Crease, MD 01/17/22 [phone]

## 2022-01-16 NOTE — Progress Notes (Signed)
[facility] admitting physician addendum:  The patient's insulin pump is unable to be used at the moment.  CBG monitoring every 4 hours with regular insulin by sliding scale has been ordered.  Sanda Klein, MD.

## 2022-01-17 DIAGNOSIS — Z79899 Other long term (current) drug therapy: Secondary | ICD-10-CM | POA: Diagnosis not present

## 2022-01-17 DIAGNOSIS — E869 Volume depletion, unspecified: Secondary | ICD-10-CM | POA: Diagnosis present

## 2022-01-17 DIAGNOSIS — E872 Acidosis, unspecified: Secondary | ICD-10-CM | POA: Diagnosis not present

## 2022-01-17 DIAGNOSIS — Z9641 Presence of insulin pump (external) (internal): Secondary | ICD-10-CM | POA: Diagnosis present

## 2022-01-17 DIAGNOSIS — Z833 Family history of diabetes mellitus: Secondary | ICD-10-CM | POA: Diagnosis not present

## 2022-01-17 DIAGNOSIS — E1010 Type 1 diabetes mellitus with ketoacidosis without coma: Secondary | ICD-10-CM | POA: Diagnosis present

## 2022-01-17 DIAGNOSIS — E871 Hypo-osmolality and hyponatremia: Secondary | ICD-10-CM | POA: Diagnosis present

## 2022-01-17 DIAGNOSIS — K3184 Gastroparesis: Secondary | ICD-10-CM | POA: Diagnosis present

## 2022-01-17 DIAGNOSIS — E1043 Type 1 diabetes mellitus with diabetic autonomic (poly)neuropathy: Secondary | ICD-10-CM | POA: Diagnosis present

## 2022-01-17 DIAGNOSIS — Z794 Long term (current) use of insulin: Secondary | ICD-10-CM | POA: Diagnosis not present

## 2022-01-17 DIAGNOSIS — R6510 Systemic inflammatory response syndrome (SIRS) of non-infectious origin without acute organ dysfunction: Secondary | ICD-10-CM | POA: Diagnosis present

## 2022-01-17 DIAGNOSIS — I10 Essential (primary) hypertension: Secondary | ICD-10-CM | POA: Diagnosis present

## 2022-01-17 LAB — GLUCOSE, CAPILLARY
Glucose-Capillary: 162 mg/dL — ABNORMAL HIGH (ref 70–99)
Glucose-Capillary: 164 mg/dL — ABNORMAL HIGH (ref 70–99)
Glucose-Capillary: 168 mg/dL — ABNORMAL HIGH (ref 70–99)
Glucose-Capillary: 176 mg/dL — ABNORMAL HIGH (ref 70–99)
Glucose-Capillary: 181 mg/dL — ABNORMAL HIGH (ref 70–99)
Glucose-Capillary: 193 mg/dL — ABNORMAL HIGH (ref 70–99)
Glucose-Capillary: 213 mg/dL — ABNORMAL HIGH (ref 70–99)
Glucose-Capillary: 224 mg/dL — ABNORMAL HIGH (ref 70–99)
Glucose-Capillary: 233 mg/dL — ABNORMAL HIGH (ref 70–99)
Glucose-Capillary: 236 mg/dL — ABNORMAL HIGH (ref 70–99)
Glucose-Capillary: 267 mg/dL — ABNORMAL HIGH (ref 70–99)
Glucose-Capillary: 291 mg/dL — ABNORMAL HIGH (ref 70–99)
Glucose-Capillary: 346 mg/dL — ABNORMAL HIGH (ref 70–99)
Glucose-Capillary: 379 mg/dL — ABNORMAL HIGH (ref 70–99)

## 2022-01-17 LAB — BASIC METABOLIC PANEL
Anion gap: 20 — ABNORMAL HIGH (ref 5–15)
Anion gap: 21 — ABNORMAL HIGH (ref 5–15)
Anion gap: 16 — ABNORMAL HIGH (ref 5–15)
Anion gap: 13 (ref 5–15)
Anion gap: 13 (ref 5–15)
BUN: 18 mg/dL (ref 6–20)
BUN: 21 mg/dL — ABNORMAL HIGH (ref 6–20)
BUN: 15 mg/dL (ref 6–20)
BUN: 13 mg/dL (ref 6–20)
BUN: 12 mg/dL (ref 6–20)
CO2: 16 mmol/L — ABNORMAL LOW (ref 22–32)
CO2: 12 mmol/L — ABNORMAL LOW (ref 22–32)
CO2: 15 mmol/L — ABNORMAL LOW (ref 22–32)
CO2: 17 mmol/L — ABNORMAL LOW (ref 22–32)
CO2: 20 mmol/L — ABNORMAL LOW (ref 22–32)
Calcium: 9.1 mg/dL (ref 8.9–10.3)
Calcium: 8.8 mg/dL — ABNORMAL LOW (ref 8.9–10.3)
Calcium: 8.4 mg/dL — ABNORMAL LOW (ref 8.9–10.3)
Calcium: 8.6 mg/dL — ABNORMAL LOW (ref 8.9–10.3)
Calcium: 9 mg/dL (ref 8.9–10.3)
Chloride: 96 mmol/L — ABNORMAL LOW (ref 98–111)
Chloride: 98 mmol/L (ref 98–111)
Chloride: 100 mmol/L (ref 98–111)
Chloride: 101 mmol/L (ref 98–111)
Chloride: 102 mmol/L (ref 98–111)
Creatinine, Ser: 1.09 mg/dL (ref 0.61–1.24)
Creatinine, Ser: 1.27 mg/dL — ABNORMAL HIGH (ref 0.61–1.24)
Creatinine, Ser: 0.91 mg/dL (ref 0.61–1.24)
Creatinine, Ser: 0.88 mg/dL (ref 0.61–1.24)
Creatinine, Ser: 0.92 mg/dL (ref 0.61–1.24)
GFR, Estimated: >60 mL/min (ref >60)
GFR, Estimated: >60 mL/min (ref >60)
GFR, Estimated: >60 mL/min (ref >60)
GFR, Estimated: >60 mL/min (ref >60)
GFR, Estimated: >60 mL/min (ref >60)
Glucose, Bld: 369 mg/dL — ABNORMAL HIGH (ref 70–99)
Glucose, Bld: 360 mg/dL — ABNORMAL HIGH (ref 70–99)
Glucose, Bld: 206 mg/dL — ABNORMAL HIGH (ref 70–99)
Glucose, Bld: 184 mg/dL — ABNORMAL HIGH (ref 70–99)
Glucose, Bld: 162 mg/dL — ABNORMAL HIGH (ref 70–99)
Potassium: 4.4 mmol/L (ref 3.5–5.1)
Potassium: 5.6 mmol/L — ABNORMAL HIGH (ref 3.5–5.1)
Potassium: 4.3 mmol/L (ref 3.5–5.1)
Potassium: 4.2 mmol/L (ref 3.5–5.1)
Potassium: 3.8 mmol/L (ref 3.5–5.1)
Sodium: 132 mmol/L — ABNORMAL LOW (ref 135–145)
Sodium: 131 mmol/L — ABNORMAL LOW (ref 135–145)
Sodium: 131 mmol/L — ABNORMAL LOW (ref 135–145)
Sodium: 131 mmol/L — ABNORMAL LOW (ref 135–145)
Sodium: 135 mmol/L (ref 135–145)

## 2022-01-17 LAB — CBC
HCT: 44.2 % (ref 39.0–52.0)
Hemoglobin: 15.2 g/dL (ref 13.0–17.0)
MCH: 30 pg (ref 26.0–34.0)
MCHC: 34.4 g/dL (ref 30.0–36.0)
MCV: 87.2 fL (ref 80.0–100.0)
Platelets: 303 10*3/uL (ref 150–400)
RBC: 5.07 MIL/uL (ref 4.22–5.81)
RDW: 12.7 % (ref 11.5–15.5)
WBC: 20 10*3/uL — ABNORMAL HIGH (ref 4.0–10.5)
nRBC: 0 % (ref 0.0–0.2)

## 2022-01-17 LAB — HEMOGLOBIN A1C
Hgb A1c MFr Bld: 9.4 % — ABNORMAL HIGH (ref 4.8–5.6)
Mean Plasma Glucose: 223.08 mg/dL

## 2022-01-17 LAB — HIV ANTIBODY (ROUTINE TESTING W REFLEX): HIV Screen 4th Generation wRfx: NONREACTIVE

## 2022-01-17 LAB — BETA-HYDROXYBUTYRIC ACID
Beta-Hydroxybutyric Acid: 5.99 mmol/L — ABNORMAL HIGH (ref 0.05–0.27)
Beta-Hydroxybutyric Acid: 1.52 mmol/L — ABNORMAL HIGH (ref 0.05–0.27)

## 2022-01-17 LAB — MRSA NEXT GEN BY PCR, NASAL: MRSA by PCR Next Gen: NOT DETECTED

## 2022-01-17 MED ORDER — INSULIN REGULAR(HUMAN) IN NACL 100-0.9 UT/100ML-% IV SOLN
INTRAVENOUS | Status: DC
  Administered 2022-01-17: 9 [IU]/h via INTRAVENOUS
  Filled 2022-01-17: qty 100

## 2022-01-17 MED ORDER — ORAL CARE MOUTH RINSE
15.0000 mL | OROMUCOSAL | Status: DC | PRN
Start: 2022-01-17 — End: 2022-01-18

## 2022-01-17 MED ORDER — HYDRALAZINE HCL 20 MG/ML IJ SOLN
10.0000 mg | INTRAMUSCULAR | Status: DC | PRN
Start: 2022-01-17 — End: 2022-01-18

## 2022-01-17 MED ORDER — LACTATED RINGERS IV BOLUS
20.0000 mL/kg | Freq: Once | INTRAVENOUS | Status: AC
  Administered 2022-01-17: 1000 mL via INTRAVENOUS

## 2022-01-17 MED ORDER — LABETALOL HCL 5 MG/ML IV SOLN
5.0000 mg | Freq: Once | INTRAVENOUS | Status: AC
  Administered 2022-01-17: 5 mg via INTRAVENOUS
  Filled 2022-01-17: qty 4

## 2022-01-17 MED ORDER — CHLORHEXIDINE GLUCONATE CLOTH 2 % EX PADS
6.0000 | MEDICATED_PAD | Freq: Every day | CUTANEOUS | Status: DC
  Administered 2022-01-17: 6 via TOPICAL

## 2022-01-17 MED ORDER — PROCHLORPERAZINE EDISYLATE 10 MG/2ML IJ SOLN
5.0000 mg | Freq: Once | INTRAMUSCULAR | Status: AC
  Administered 2022-01-18: 5 mg via INTRAVENOUS
  Filled 2022-01-17: qty 2

## 2022-01-17 MED ORDER — LACTATED RINGERS IV SOLN: INTRAVENOUS | Status: DC

## 2022-01-17 MED ORDER — DEXTROSE IN LACTATED RINGERS 5 % IV SOLN: INTRAVENOUS | Status: DC

## 2022-01-17 MED ORDER — INSULIN GLARGINE-YFGN 100 UNIT/ML ~~LOC~~ SOLN
20.0000 [IU] | Freq: Every day | SUBCUTANEOUS | Status: DC
  Administered 2022-01-17: 20 [IU] via SUBCUTANEOUS
  Filled 2022-01-17: qty 0.2

## 2022-01-17 MED ORDER — SIMETHICONE 80 MG PO CHEW
80.0000 mg | CHEWABLE_TABLET | Freq: Once | ORAL | Status: DC
Start: 2022-01-18 — End: 2022-01-18

## 2022-01-17 MED ORDER — INSULIN ASPART 100 UNIT/ML IJ SOLN
0.0000 [IU] | INTRAMUSCULAR | Status: DC
  Administered 2022-01-17: 5 [IU] via SUBCUTANEOUS
  Administered 2022-01-18: 3 [IU] via SUBCUTANEOUS
  Administered 2022-01-18: 5 [IU] via SUBCUTANEOUS

## 2022-01-17 MED ORDER — POTASSIUM CHLORIDE 10 MEQ/100ML IV SOLN
10.0000 meq | INTRAVENOUS | Status: AC
  Administered 2022-01-17 (×2): 10 meq via INTRAVENOUS
  Filled 2022-01-17 (×2): qty 100

## 2022-01-17 MED ORDER — DEXTROSE 50 % IV SOLN: 0.0000 mL | INTRAVENOUS | Status: DC | PRN

## 2022-01-17 NOTE — Progress Notes (Signed)
   01/16/22 1500  Take Vital Signs  Increase Vital Sign Frequency  Yellow: Q 2hr X 2 then Q 4hr X 2, if remains yellow, continue Q 4hrs  Escalate  MEWS: Escalate Yellow: discuss with charge nurse/RN and consider discussing with provider and RRT  Notify: Charge Nurse/RN  Name of Charge Nurse/RN Notified Abby Dicky  Date Charge Nurse/RN Notified 01/16/22  Time Charge Nurse/RN Notified 1500  Notify: Provider  Provider Name/Title D. Robb Matar  Date Provider Notified 01/16/22  Time Provider Notified 1500  Method of Notification Page  Notification Reason Other (Comment) (Yellow MEWS on arrival)  Provider response See new orders  Date of Provider Response 01/16/22  Time of Provider Response 1505

## 2022-01-17 NOTE — Progress Notes (Signed)
   01/16/22 1359  Assess: MEWS Score  Temp 99.6 F (37.6 C)  BP (!) 188/109  MAP (mmHg) 129  Pulse Rate (!) 115  Resp 16  SpO2 100 %  O2 Device Room Air  Assess: MEWS Score  MEWS Temp 0  MEWS Systolic 0  MEWS Pulse 2  MEWS RR 0  MEWS LOC 0  MEWS Score 2  MEWS Score Color Yellow  Assess: if the MEWS score is Yellow or Red  Were vital signs taken at a resting state? No  Assess: SIRS CRITERIA  SIRS Temperature  0  SIRS Pulse 1  SIRS Respirations  0  SIRS WBC 1  SIRS Score Sum  2

## 2022-01-17 NOTE — Inpatient Diabetes Management (Addendum)
Inpatient Diabetes Program Recommendations  AACE/ADA: New Consensus Statement on Inpatient Glycemic Control (2015)  Target Ranges:  Prepandial:   less than 140 mg/dL      Peak postprandial:   less than 180 mg/dL (1-2 hours)      Critically ill patients:  140 - 180 mg/dL    Latest Reference Range & Units 01/17/22 05:33  Sodium 135 - 145 mmol/L 132 (L)  Potassium 3.5 - 5.1 mmol/L 4.4  Chloride 98 - 111 mmol/L 96 (L)  CO2 22 - 32 mmol/L 16 (L)  Glucose 70 - 99 mg/dL 174 (H)  Mean Plasma Glucose mg/dL 944.96  BUN 6 - 20 mg/dL 18  Creatinine 7.59 - 1.63 mg/dL 8.46  Calcium 8.9 - 65.9 mg/dL 9.1  Anion gap 5 - 15  20 (H)  (L): Data is abnormally low (H): Data is abnormally high   Latest Reference Range & Units 01/16/22 09:37 01/16/22 12:07 01/16/22 14:19 01/16/22 17:18 01/16/22 19:24 01/16/22 23:35 01/17/22 03:21 01/17/22 07:42 01/17/22 10:33 01/17/22 11:40  Glucose-Capillary 70 - 99 mg/dL 935 (H) 701 (H) 779 (H) 218 (H) 257 (H) 301 (H) 346 (H) 379 (H) 291 (H)  IV Insulin drip Started @1036  236 (H)  (H): Data is abnormally high   Admit with: DKA  History: Type 1 diabetes  Home DM Meds: Insulin Pump  Current Orders: IV Insulin Drip    MD- Please leave pt on the IV Insulin Drip until CBGs stable, CO2 at least 20 or higher, and Anion Gap 12 or less  If pt has all new Insulin Pump supplies, we can allow him to transition back to his insulin pump when ready.  If he does not have supplies, we will need to transition to basal bolus regimen when ready. Semglee 25 units Daily (80% total basal on pump) Novolog Moderate Correction Scale/ SSI (0-15 units) TID AC + HS Novolog 6 units TID with meals    Endocrinologist: [facility] Last seen in Office 03/24/2021 Tandem Tslim insulin Pump Dexcom CGM Pump Settings at that visit were as follows: Time Basal ICR ISF IOB Target  00:00 1.3 6 30 3  100-110  06:30 1.5 6 30 3  100-110  12:00 1.4 6 30 3  100-110  23:00 1.15 6 30 3  100-110      --Will follow patient during hospitalization--  13/05/2020 RN, MSN, CDCES Diabetes Coordinator Inpatient Glycemic Control Team Team Pager: [phone] (8a-5p)

## 2022-01-17 NOTE — Progress Notes (Signed)
Patient arrived to floor alert, but drowsy. Insulin pump disconnected from pt. Went over pump contract and pt reconnected pump. He states reading on glucose monitor reads "high". Asked pt what that meant and he states "sugar is over 400". CBG immediately checked and reading was 291. RN then asked what he would bolus himself with and he stated "6 units". He then bolused himself with 6 units. Patient remains drowsy and RN having to repeat instructions/questions more than once. He is easily arousable. RN asked pt if he was ok to come off pump and allow nursing staff to check CBGs and administer required insulin coverage and he agreed. Md notified of patient's state of drowsiness and all that is required for Insulin pump log, differences in glucometer readings and patient's agreeing to nursing giving coverage. New orders were obtained for glucose monitoring.Melton Alar, RN

## 2022-01-17 NOTE — Progress Notes (Signed)
Patient arrives to floor as Yellow MEWS. Protocol started for monitoring

## 2022-01-17 NOTE — Progress Notes (Signed)
PROGRESS NOTE  Mason Mclaughlin  [id number] DOB: 1995-08-16 DOA: 01/16/2022 PCP: Everrett Coombe, MD   Brief Narrative:  Patient is a 26 year old male with history of type 1 diabetes on insulin pump, history of DKA,  hypertension who presented to the emergency department with complaint of nausea, vomiting, abdominal pain, fatigue.  On presentation, he was tachycardic, hypertensive.  Patient was given IV fluids, as needed medications for hypertension, metoclopramide.  His glucose was in the range of 500, urine showed ketones.  Anion gap was normal.  Patient was started on insulin pump.  After hospitalization, patient's anion gap gradually widened, he went into DKA.  Patient started on DKA protocol and sent to stepdown.  Assessment & Plan:  Principal Problem:   Lactic acidosis Active Problems:   Nausea and vomiting   Hypertension complicating diabetes ([place])   Type 1 diabetes mellitus not at goal ([place])   SIRS (systemic inflammatory response syndrome) ([facility])   Volume depletion, gastrointestinal loss   Diabetic gastroparesis ([place])   DKA: History of type 1 diabetes: Takes insulin pump at home.  Presented with nausea, vomiting, abdominal pain, fatigue. Not in DKA on presentation but developed afterwards.  Started on insulin drip, DKA protocol.  Patient sent to stepdown. Keep n.p.o. for now, IV fluids to be continued.  Nausea, vomiting, abdominal pain: In the setting of DKA.  Continue supportive care, current management.  The symptoms have resolved now.  Hypertension: Continue current medications for now.  Continue to monitor blood pressure.  Takes lisinopril at home.  Lactic acidosis: Most likely from volume depletion.  Continue IV fluids  Hyponatremia: This is secondary to hyperosmolar hyponatremia secondary to hyperglycemia.  Expect improvement with improvement in the blood sugar  Leukocytosis: WBC of 20.  This is most likely reactive.  Low suspicion for infectious etiology.         DVT prophylaxis:SCDs Start: 01/17/22 0755 SCDs Start: 01/16/22 0959     Code Status: Full Code  Family Communication: None at bedside  Patient status:Inpatient  Patient is from :Home  Anticipated discharge RC:[id number]  Estimated DC date:1-2 days   Consultants: None  Procedures: None  Antimicrobials:  Anti-infectives (From admission, onward)    None       Subjective: Patient seen and examined at the bedside this morning.  His nausea, vomiting, abdominal pain have resolved.  He was eager to go home and was asking when can I go home.  We discussed about the need to continue IV insulin and transfer to stepdown because he has developed DKA.  Objective: Vitals:   01/16/22 1927 01/16/22 2328 01/17/22 0200 01/17/22 0525  BP: (!) 150/75 (!) 160/96 (!) 143/93 (!) 146/88  Pulse: 100 (!) 112 98 (!) 106  Resp: 18 (!) 21 14 17   Temp: 98.8 F (37.1 C) 98.8 F (37.1 C) 98.3 F (36.8 C) 97.8 F (36.6 C)  TempSrc: Oral Oral Oral Oral  SpO2: 99% 99% 100% 100%  Weight:        Intake/Output Summary (Last 24 hours) at 01/17/2022 0756 Last data filed at 01/17/2022 0602 Gross per 24 hour  Intake 2061.18 ml  Output --  Net 2061.18 ml   Filed Weights   01/16/22 0400  Weight: 68 kg    Examination:  General exam: Overall comfortable, not in distress HEENT: PERRL Respiratory system:  no wheezes or crackles  Cardiovascular system: S1 & S2 heard, RRR.  Gastrointestinal system: Abdomen is nondistended, soft and nontender. Central nervous system: Alert and oriented Extremities: No edema,  no clubbing ,no cyanosis Skin: No rashes, no ulcers,no icterus     Data Reviewed: I have personally reviewed following labs and imaging studies  CBC: Recent Labs  Lab 01/16/22 0357 01/16/22 0440 01/17/22 0533  WBC 17.4*  --  20.0*  NEUTROABS 15.0*  --   --   HGB 15.0 15.3 15.2  HCT 42.2 45.0 44.2  MCV 85.6  --  87.2  PLT 312  --  303   Basic Metabolic Panel: Recent Labs  Lab  01/16/22 0357 01/16/22 0440 01/16/22 1215 01/17/22 0533  NA 137 137  --  132*  K 3.7 5.1  --  4.4  CL 99 101  --  96*  CO2 26  --   --  16*  GLUCOSE 288* 295*  --  369*  BUN 12 13  --  18  CREATININE 1.11 0.80  --  1.09  CALCIUM 9.8  --   --  9.1  MG  --   --  1.7  --   PHOS  --   --  1.7*  --      No results found for this or any previous visit (from the past 240 hour(s)).   Radiology Studies: CT ABDOMEN PELVIS WO CONTRAST  Result Date: 01/16/2022 CLINICAL DATA:  Nausea with vomiting. EXAM: CT ABDOMEN AND PELVIS WITHOUT CONTRAST TECHNIQUE: Multidetector CT imaging of the abdomen and pelvis was performed following the standard protocol without IV contrast. RADIATION DOSE REDUCTION: This exam was performed according to the departmental dose-optimization program which includes automated exposure control, adjustment of the mA and/or kV according to patient size and/or use of iterative reconstruction technique. COMPARISON:  CT with IV contrast 02/15/2021, CT without contrast 09/11/2020 FINDINGS: Lower chest: There is a stable 7 mm rounded ground-glass nodule in the left lower lobe on 6:16, unchanged since 09/11/2020. Rest of the lung bases are clear. Small hiatal hernia. Hepatobiliary: There is a mildly steatotic liver again noted with focal periligamentous fat again seen in segment 4B. The unenhanced liver is otherwise unremarkable. The gallbladder and bile ducts are unremarkable. Pancreas: Unremarkable without contrast. Spleen: Normal in size and noncontrast attenuation. Adrenals/Urinary Tract: There is no adrenal mass. There are few tiny punctate nonobstructive caliceal stones in the superior pole of both kidneys. There is no hydronephrosis or ureteral stone. The bladder is normal for the degree of distention. Stomach/Bowel: No dilatation or wall thickening is seen including of the visualized appendix with portions of the appendix obscured by overlapping unopacified bowel. Vascular/Lymphatic:  No significant vascular findings are present. No enlarged abdominal or pelvic lymph nodes. Reproductive: Normal prostate. Both testicles are in the scrotal sac. Other: There is no free air, free hemorrhage or free fluid. There is no incarcerated hernia. There are phleboliths in the pelvic sidewalls and floor. Musculoskeletal: No acute or significant osseous findings. IMPRESSION: 1. No acute noncontrast CT abnormality or interval changes. 2. Mild hepatic steatosis. 3. Small hiatal hernia. 4. Stable 7 mm ground-glass left lower lobe nodule. There is documented 16 months of imaging stability. In a low risk patient, further follow-up at 18-24 months (from the date of the initial scan of 09/11/2020) is optional but recommended in a high-risk patient. These guidelines do not apply to cancer patients or immunocompromised patients. Reference: Radiology 2017; 284: 228-43. Electronically Signed   By: Almira Bar M.D.   On: 01/16/2022 07:40   CT Head Wo Contrast  Result Date: 01/16/2022 CLINICAL DATA:  26 year old male with history of altered mental status and  vomiting. EXAM: CT HEAD WITHOUT CONTRAST TECHNIQUE: Contiguous axial images were obtained from the base of the skull through the vertex without intravenous contrast. RADIATION DOSE REDUCTION: This exam was performed according to the departmental dose-optimization program which includes automated exposure control, adjustment of the mA and/or kV according to patient size and/or use of iterative reconstruction technique. COMPARISON:  No priors. FINDINGS: Brain: No evidence of acute infarction, hemorrhage, hydrocephalus, extra-axial collection or mass lesion/mass effect. Vascular: No hyperdense vessel or unexpected calcification. Skull: Normal. Negative for fracture or focal lesion. Sinuses/Orbits: No acute finding. Other: None. IMPRESSION: 1. No acute intracranial abnormalities. The appearance of the brain is normal. Electronically Signed   By: Trudie Reed M.D.    On: 01/16/2022 05:02   DG Chest Port 1 View  Result Date: 01/16/2022 CLINICAL DATA:  Altered mental status. Type 1 diabetic with vomiting EXAM: PORTABLE CHEST 1 VIEW COMPARISON:  07/19/2021 FINDINGS: Artifact from EKG leads. Normal heart size and mediastinal contours. No acute infiltrate or edema. No effusion or pneumothorax. No acute osseous findings. IMPRESSION: Negative portable chest. Electronically Signed   By: Tiburcio Pea M.D.   On: 01/16/2022 04:44    Scheduled Meds:  lisinopril  10 mg Oral q morning   metoCLOPramide (REGLAN) injection  10 mg Intravenous Q6H   metoprolol tartrate  5 mg Intravenous Q8H   Continuous Infusions:  dextrose 5% lactated ringers     insulin     lactated ringers     lactated ringers     potassium chloride       LOS: 0 days   Burnadette Pop, MD [facility] P8/27/2023, 7:56 AM

## 2022-01-17 NOTE — Progress Notes (Signed)
Insulin pump removed by patient per order.Melton Alar, RN

## 2022-01-18 ENCOUNTER — Other Ambulatory Visit (HOSPITAL_COMMUNITY): Payer: Self-pay

## 2022-01-18 DIAGNOSIS — E872 Acidosis, unspecified: Secondary | ICD-10-CM | POA: Diagnosis not present

## 2022-01-18 LAB — CBC
HCT: 42.8 % (ref 39.0–52.0)
Hemoglobin: 15.3 g/dL (ref 13.0–17.0)
MCH: 30.8 pg (ref 26.0–34.0)
MCHC: 35.7 g/dL (ref 30.0–36.0)
MCV: 86.1 fL (ref 80.0–100.0)
Platelets: 283 10*3/uL (ref 150–400)
RBC: 4.97 MIL/uL (ref 4.22–5.81)
RDW: 12.3 % (ref 11.5–15.5)
WBC: 19.6 10*3/uL — ABNORMAL HIGH (ref 4.0–10.5)
nRBC: 0 % (ref 0.0–0.2)

## 2022-01-18 LAB — BASIC METABOLIC PANEL
Anion gap: 12 (ref 5–15)
Anion gap: 13 (ref 5–15)
BUN: 12 mg/dL (ref 6–20)
BUN: 11 mg/dL (ref 6–20)
CO2: 23 mmol/L (ref 22–32)
CO2: 22 mmol/L (ref 22–32)
Calcium: 8.6 mg/dL — ABNORMAL LOW (ref 8.9–10.3)
Calcium: 8.7 mg/dL — ABNORMAL LOW (ref 8.9–10.3)
Chloride: 99 mmol/L (ref 98–111)
Chloride: 100 mmol/L (ref 98–111)
Creatinine, Ser: 0.85 mg/dL (ref 0.61–1.24)
Creatinine, Ser: 0.8 mg/dL (ref 0.61–1.24)
GFR, Estimated: >60 mL/min (ref >60)
GFR, Estimated: >60 mL/min (ref >60)
Glucose, Bld: 254 mg/dL — ABNORMAL HIGH (ref 70–99)
Glucose, Bld: 216 mg/dL — ABNORMAL HIGH (ref 70–99)
Potassium: 3.6 mmol/L (ref 3.5–5.1)
Potassium: 3.4 mmol/L — ABNORMAL LOW (ref 3.5–5.1)
Sodium: 134 mmol/L — ABNORMAL LOW (ref 135–145)
Sodium: 135 mmol/L (ref 135–145)

## 2022-01-18 LAB — GLUCOSE, CAPILLARY
Glucose-Capillary: 172 mg/dL — ABNORMAL HIGH (ref 70–99)
Glucose-Capillary: 235 mg/dL — ABNORMAL HIGH (ref 70–99)
Glucose-Capillary: 214 mg/dL — ABNORMAL HIGH (ref 70–99)

## 2022-01-18 LAB — LACTIC ACID, PLASMA: Lactic Acid, Venous: 1.5 mmol/L (ref 0.5–1.9)

## 2022-01-18 MED ORDER — POTASSIUM CHLORIDE CRYS ER 20 MEQ PO TBCR
40.0000 meq | EXTENDED_RELEASE_TABLET | Freq: Once | ORAL | Status: AC
  Administered 2022-01-18: 40 meq via ORAL
  Filled 2022-01-18: qty 2

## 2022-01-18 MED ORDER — INSULIN ASPART 100 UNIT/ML IJ SOLN: 0.0000 [IU] | Freq: Three times a day (TID) | INTRAMUSCULAR | Status: DC

## 2022-01-18 MED ORDER — LISINOPRIL 20 MG PO TABS
20.0000 mg | ORAL_TABLET | Freq: Every morning | ORAL | 1 refills | Status: DC
  Filled 2022-01-18: qty 30, 30d supply, fill #0

## 2022-01-18 MED ORDER — INSULIN GLARGINE-YFGN 100 UNIT/ML ~~LOC~~ SOLN
25.0000 [IU] | Freq: Every day | SUBCUTANEOUS | Status: DC
  Filled 2022-01-18: qty 0.25

## 2022-01-18 MED ORDER — INSULIN ASPART 100 UNIT/ML IJ SOLN: 6.0000 [IU] | Freq: Three times a day (TID) | INTRAMUSCULAR | Status: DC

## 2022-01-18 MED ORDER — LISINOPRIL 10 MG PO TABS
20.0000 mg | ORAL_TABLET | Freq: Every morning | ORAL | Status: DC
  Administered 2022-01-18: 20 mg via ORAL
  Filled 2022-01-18: qty 2

## 2022-01-18 NOTE — Discharge Summary (Signed)
Physician Discharge Summary  Mason Mclaughlin JIR:[id number] DOB: [birth year] DOA: 01/16/2022  PCP: Harvel Quale, MD  Admit date: 01/16/2022 Discharge date: 01/18/2022  Admitted From: Home Disposition:  Home  Discharge Condition:Stable CODE STATUS:FULL Diet recommendation: Carb Modified    Brief/Interim Summary:  Patient is a 26 year old male with history of type 1 diabetes on insulin pump, history of DKA,  hypertension who presented to the emergency department with complaint of nausea, vomiting, abdominal pain, fatigue.  On presentation, he was tachycardic, hypertensive.  Patient was given IV fluids, as needed medications for hypertension, metoclopramide.  His glucose was in the range of 500, urine showed ketones.  Anion gap was normal.  Patient was started on insulin pump.  After hospitalization, patient's anion gap gradually widened, he went into DKA.  Patient started on DKA protocol and sent to stepdown.  Gap has again closed and he has been transitioned to long-acting and sliding scale.  Diabetic goal nature was following.  He still remains in mild sinus tachycardia, hypertensive.  Extensive discussion done with the patient at the bedside about the importance of monitoring 1 more day in the hospital with IV fluids and current insulin regimen and antihypertensives but he is adamant about leaving and will sign AGAINST MEDICAL ADVICE if he needs to stay in the hospital.  Patient will be discharged to home today to continue his insulin pump.  He will follow-up with his PCP and endocrinologist as an outpatient.  Following problems were addressed during his hospitalization:  DKA/diabetes type 1: History of type 1 diabetes: Takes insulin pump at home.  Presented with nausea, vomiting, abdominal pain, fatigue. Not in DKA on presentation but developed afterwards.  Started on insulin drip, DKA protocol.  Patient sent to stepdown. DKA has resolved now.  Continue insulin pump at home   Nausea,  vomiting, abdominal pain: In the setting of DKA.  Continue supportive care, current management.  The symptoms have resolved now.   Hypertension: Remains hypertensive.  Lisinopril increased to 20 mg daily.   Lactic acidosis: Treated with IV fluids   Hyponatremia: Resolved   leukocytosis: On checking his lab work, he has chronic leukocytosis.  Unclear etiology.  No work-up has been done yet.  I have sent message to Dr. Arbutus Ped to arrange a follow-up appointment with hematology as an outpatient    Discharge Diagnoses:  Principal Problem:   Lactic acidosis Active Problems:   Nausea and vomiting   Hypertension complicating diabetes ([facility])   DKA (diabetic ketoacidosis) ([facility])   Type 1 diabetes mellitus not at goal ([facility])   SIRS (systemic inflammatory response syndrome) ([facility])   Volume depletion, gastrointestinal loss   Diabetic gastroparesis [facility])    Discharge Instructions  Discharge Instructions     Diet Carb Modified   Complete by: As directed    Discharge instructions   Complete by: As directed    1)Please take prescribed medication as instructed 2)Follow up with your PCP and endocrinologist 3)Monitor your blood sugars and blood glucose at home. Take your insulin as instructed   Increase activity slowly   Complete by: As directed       Allergies as of 01/18/2022   No Known Allergies      Medication List     TAKE these medications    insulin pump Soln Inject into the skin continuous. Novolog   lisinopril 20 MG tablet Commonly known as: ZESTRIL Take 1 tablet (20 mg total) by mouth every morning. Start taking on: January 19, 2022 What changed:  medication strength how much to take   ondansetron 4 MG tablet Commonly known as: ZOFRAN Take 1 tablet (4 mg total) by mouth every 8 (eight) hours as needed for up to 20 doses for nausea or vomiting.   promethazine 25 MG tablet Commonly known as: PHENERGAN Take 1 tablet (25 mg total) by mouth every 6 (six) hours as  needed for nausea or vomiting.        Follow-up Information     Shariff, Charise Killian, MD. Schedule an appointment as soon as possible for a visit in 1 week(s).   Specialty: Endocrinology Contact information: [address] [place] [address] [phone]                No Known Allergies  Consultations: None   Procedures/Studies: CT ABDOMEN PELVIS WO CONTRAST  Result Date: 01/16/2022 CLINICAL DATA:  Nausea with vomiting. EXAM: CT ABDOMEN AND PELVIS WITHOUT CONTRAST TECHNIQUE: Multidetector CT imaging of the abdomen and pelvis was performed following the standard protocol without IV contrast. RADIATION DOSE REDUCTION: This exam was performed according to the departmental dose-optimization program which includes automated exposure control, adjustment of the mA and/or kV according to patient size and/or use of iterative reconstruction technique. COMPARISON:  CT with IV contrast 02/15/2021, CT without contrast 09/11/2020 FINDINGS: Lower chest: There is a stable 7 mm rounded ground-glass nodule in the left lower lobe on 6:16, unchanged since 09/11/2020. Rest of the lung bases are clear. Small hiatal hernia. Hepatobiliary: There is a mildly steatotic liver again noted with focal periligamentous fat again seen in segment 4B. The unenhanced liver is otherwise unremarkable. The gallbladder and bile ducts are unremarkable. Pancreas: Unremarkable without contrast. Spleen: Normal in size and noncontrast attenuation. Adrenals/Urinary Tract: There is no adrenal mass. There are few tiny punctate nonobstructive caliceal stones in the superior pole of both kidneys. There is no hydronephrosis or ureteral stone. The bladder is normal for the degree of distention. Stomach/Bowel: No dilatation or wall thickening is seen including of the visualized appendix with portions of the appendix obscured by overlapping unopacified bowel. Vascular/Lymphatic: No significant vascular findings are present. No  enlarged abdominal or pelvic lymph nodes. Reproductive: Normal prostate. Both testicles are in the scrotal sac. Other: There is no free air, free hemorrhage or free fluid. There is no incarcerated hernia. There are phleboliths in the pelvic sidewalls and floor. Musculoskeletal: No acute or significant osseous findings. IMPRESSION: 1. No acute noncontrast CT abnormality or interval changes. 2. Mild hepatic steatosis. 3. Small hiatal hernia. 4. Stable 7 mm ground-glass left lower lobe nodule. There is documented 16 months of imaging stability. In a low risk patient, further follow-up at 18-24 months (from the date of the initial scan of 09/11/2020) is optional but recommended in a high-risk patient. These guidelines do not apply to cancer patients or immunocompromised patients. Reference: Radiology 2017; 284: 228-43. Electronically Signed   By: Almira Bar M.D.   On: 01/16/2022 07:40   CT Head Wo Contrast  Result Date: 01/16/2022 CLINICAL DATA:  26 year old male with history of altered mental status and vomiting. EXAM: CT HEAD WITHOUT CONTRAST TECHNIQUE: Contiguous axial images were obtained from the base of the skull through the vertex without intravenous contrast. RADIATION DOSE REDUCTION: This exam was performed according to the departmental dose-optimization program which includes automated exposure control, adjustment of the mA and/or kV according to patient size and/or use of iterative reconstruction technique. COMPARISON:  No priors. FINDINGS: Brain: No evidence of acute infarction, hemorrhage, hydrocephalus, extra-axial collection  or mass lesion/mass effect. Vascular: No hyperdense vessel or unexpected calcification. Skull: Normal. Negative for fracture or focal lesion. Sinuses/Orbits: No acute finding. Other: None. IMPRESSION: 1. No acute intracranial abnormalities. The appearance of the brain is normal. Electronically Signed   By: Trudie Reedaniel  Entrikin M.D.   On: 01/16/2022 05:02   DG Chest Port 1  View  Result Date: 01/16/2022 CLINICAL DATA:  Altered mental status. Type 1 diabetic with vomiting EXAM: PORTABLE CHEST 1 VIEW COMPARISON:  07/19/2021 FINDINGS: Artifact from EKG leads. Normal heart size and mediastinal contours. No acute infiltrate or edema. No effusion or pneumothorax. No acute osseous findings. IMPRESSION: Negative portable chest. Electronically Signed   By: Tiburcio PeaJonathan  Watts M.D.   On: 01/16/2022 04:44      Subjective: Patient seen and examined at the bedside this morning.  Patient adamant about leaving.  Discharge orders placed.  Appears comfortable on the day of discharge  Discharge Exam: Vitals:   01/18/22 0800 01/18/22 1031  BP:  (!) 171/93  Pulse: (!) 101   Resp: 20   Temp: 98.6 F (37 C)   SpO2: 100%    Vitals:   01/18/22 0400 01/18/22 0700 01/18/22 0800 01/18/22 1031  BP: (!) 142/85   (!) 171/93  Pulse: (!) 117 (!) 103 (!) 101   Resp: 20 17 20    Temp: 98.1 F (36.7 C)  98.6 F (37 C)   TempSrc: Oral  Oral   SpO2: 99% 98% 100%   Weight:      Height:        General: Pt is alert, awake, not in acute distress Cardiovascular: RRR, S1/S2 +,mild sinus tachycardia , no rubs, no gallops Respiratory: CTA bilaterally, no wheezing, no rhonchi Abdominal: Soft, NT, ND, bowel sounds + Extremities: no edema, no cyanosis    The results of significant diagnostics from this hospitalization (including imaging, microbiology, ancillary and laboratory) are listed below for reference.     Microbiology: Recent Results (from the past 240 hour(s))  MRSA Next Gen by PCR, Nasal     Status: None   Collection Time: 01/17/22 12:05 PM   Specimen: Nasal Mucosa; Nasal Swab  Result Value Ref Range Status   MRSA by PCR Next Gen NOT DETECTED NOT DETECTED Final    Comment: (NOTE) The GeneXpert MRSA Assay (FDA approved for NASAL specimens only), is one component of a comprehensive MRSA colonization surveillance program. It is not intended to diagnose MRSA infection nor to  guide or monitor treatment for MRSA infections. Test performance is not FDA approved in patients less than 25 years old. Performed at [facility], [address] [place], [place] [id number]      Labs: BNP (last 3 results) No results for input(s): "BNP" in the last 8760 hours. Basic Metabolic Panel: Recent Labs  Lab 01/16/22 1215 01/17/22 0533 01/17/22 1426 01/17/22 1613 01/17/22 1903 01/18/22 0247 01/18/22 0718  NA  --    < > 131* 131* 135 134* 135  K  --    < > 4.3 4.2 3.8 3.6 3.4*  CL  --    < > 100 101 102 99 100  CO2  --    < > 15* 17* 20* 23 22  GLUCOSE  --    < > 206* 184* 162* 254* 216*  BUN  --    < > 15 13 12 12 11   CREATININE  --    < > 0.91 0.88 0.92 0.85 0.80  CALCIUM  --    < >  8.4* 8.6* 9.0 8.6* 8.7*  MG 1.7  --   --   --   --   --   --   PHOS 1.7*  --   --   --   --   --   --    < > = values in this interval not displayed.   Liver Function Tests: Recent Labs  Lab 01/16/22 0357  AST 29  ALT 31  ALKPHOS 108  BILITOT 1.0  PROT 8.5*  ALBUMIN 4.3   Recent Labs  Lab 01/16/22 0644  LIPASE 22   Recent Labs  Lab 01/16/22 0550  AMMONIA 14   CBC: Recent Labs  Lab 01/16/22 0357 01/16/22 0440 01/17/22 0533 01/18/22 0247  WBC 17.4*  --  20.0* 19.6*  NEUTROABS 15.0*  --   --   --   HGB 15.0 15.3 15.2 15.3  HCT 42.2 45.0 44.2 42.8  MCV 85.6  --  87.2 86.1  PLT 312  --  303 283   Cardiac Enzymes: Recent Labs  Lab 01/16/22 0644  CKTOTAL 277   BNP: Invalid input(s): "POCBNP" CBG: Recent Labs  Lab 01/17/22 2053 01/17/22 2150 01/17/22 2347 01/18/22 0315 01/18/22 0742  GLUCAP 172* 176* 267* 235* 214*   D-Dimer No results for input(s): "DDIMER" in the last 72 hours. Hgb A1c Recent Labs    01/17/22 0533  HGBA1C 9.4*   Lipid Profile No results for input(s): "CHOL", "HDL", "LDLCALC", "TRIG", "CHOLHDL", "LDLDIRECT" in the last 72 hours. Thyroid function studies Recent Labs    01/16/22 0634  TSH 2.111    Anemia work up No results for input(s): "VITAMINB12", "FOLATE", "FERRITIN", "TIBC", "IRON", "RETICCTPCT" in the last 72 hours. Urinalysis    Component Value Date/Time   COLORURINE STRAW (A) 01/16/2022 0535   APPEARANCEUR CLEAR 01/16/2022 0535   LABSPEC 1.016 01/16/2022 0535   PHURINE 8.0 01/16/2022 0535   GLUCOSEU >=500 (A) 01/16/2022 0535   HGBUR MODERATE (A) 01/16/2022 0535   BILIRUBINUR NEGATIVE 01/16/2022 0535   KETONESUR 80 (A) 01/16/2022 0535   PROTEINUR 100 (A) 01/16/2022 0535   NITRITE NEGATIVE 01/16/2022 0535   LEUKOCYTESUR NEGATIVE 01/16/2022 0535   Sepsis Labs Recent Labs  Lab 01/16/22 0357 01/17/22 0533 01/18/22 0247  WBC 17.4* 20.0* 19.6*   Microbiology Recent Results (from the past 240 hour(s))  MRSA Next Gen by PCR, Nasal     Status: None   Collection Time: 01/17/22 12:05 PM   Specimen: Nasal Mucosa; Nasal Swab  Result Value Ref Range Status   MRSA by PCR Next Gen NOT DETECTED NOT DETECTED Final    Comment: (NOTE) The GeneXpert MRSA Assay (FDA approved for NASAL specimens only), is one component of a comprehensive MRSA colonization surveillance program. It is not intended to diagnose MRSA infection nor to guide or monitor treatment for MRSA infections. Test performance is not FDA approved in patients less than 75 years old. Performed at [facility], [address], [place], [place] [address]     Please note: You were cared for by a hospitalist during your hospital stay. Once you are discharged, your primary care physician will handle any further medical issues. Please note that NO REFILLS for any discharge medications will be authorized once you are discharged, as it is imperative that you return to your primary care physician (or establish a relationship with a primary care physician if you do not have one) for your post hospital discharge needs so that they can reassess your need for medications  and monitor your lab  values.    Time coordinating discharge: 40 minutes  SIGNED:   Burnadette Pop, MD  [facility] 01/18/2022, 12:55 PM Pager [phone]  If 7PM-7AM, please contact night-coverage [email or web address] Password [email or web address]

## 2022-01-18 NOTE — Progress Notes (Signed)
Inpatient Diabetes Program Recommendations  AACE/ADA: New Consensus Statement on Inpatient Glycemic Control (2015)  Target Ranges:  Prepandial:   less than 140 mg/dL      Peak postprandial:   less than 180 mg/dL (1-2 hours)      Critically ill patients:  140 - 180 mg/dL   Lab Results  Component Value Date   GLUCAP 214 (H) 01/18/2022   HGBA1C 9.4 (H) 01/17/2022    Review of Glycemic Control  Diabetes history: DM1 Outpatient Diabetes medications: Insulin pump Current orders for Inpatient glycemic control: Semglee 25 QD, Novolog 0-15 TID with meals + 6 units TID  HgbA1C - 9.4%  Inpatient Diabetes Program Recommendations:    Insulin pump needs recharging. Got [employer] development officer and pt is charging pump. To restart after discharge to home. Has been given correction insulin this am. To restart immediately when home. (Travel time 10 min) Discussed HgbA1C of 9.4%. Pt states he will get real busy and forget to bolus for his meals sometimes. Knows his goal of < 7%. Hx gastroparesis. Pt states he's upset he went into DKA after pump was taken off on 8/26. Discussed that pump is his pancreas and he needs insulin since pancreas does not make it. Asking to be discharged now. Discussed above with both MD and RN.   To f/u with [facility].  Thank you. Ailene Ards, RD, LDN, CDCES Inpatient Diabetes Coordinator [phone]

## 2022-01-18 NOTE — Clinical Social Work Note (Signed)
  Transition of Care [facility]) Screening Note   Patient Details  Name: Mason Mclaughlin Date of Birth: Sep 14, 1995   Transition of Care [facility]) CM/SW Contact:    Darleene Cleaver, LCSW Phone Number: 01/18/2022, 1:25 PM    Transition of Care Department [facility]) has reviewed patient and no TOC needs have been identified at this time. We will continue to monitor patient advancement through interdisciplinary progression rounds. If new patient transition needs arise, please place a TOC consult.

## 2022-01-19 ENCOUNTER — Other Ambulatory Visit (HOSPITAL_COMMUNITY): Payer: Self-pay

## 2022-01-19 ENCOUNTER — Telehealth: Payer: Self-pay | Admitting: Internal Medicine

## 2022-01-19 NOTE — Telephone Encounter (Signed)
Called pt to sch new hem appt per 8/28 staff msg. No answer. Left msg for pt to call back to sch appt.  

## 2022-01-20 ENCOUNTER — Telehealth: Payer: Self-pay | Admitting: Internal Medicine

## 2022-01-20 NOTE — Telephone Encounter (Signed)
Called pt to sch new hem appt per 8/28 staff msg. No answer. Left msg for pt to call back to sch appt.  

## 2022-01-21 ENCOUNTER — Telehealth: Payer: Self-pay | Admitting: Internal Medicine

## 2022-01-21 NOTE — Telephone Encounter (Signed)
Called pt to sch new hem appt per 8/28 staff msg. No answer. Left msg for pt to call back to sch appt.

## 2022-08-18 ENCOUNTER — Encounter (HOSPITAL_COMMUNITY): Payer: Self-pay

## 2022-08-18 ENCOUNTER — Other Ambulatory Visit: Payer: Self-pay

## 2022-08-18 ENCOUNTER — Inpatient Hospital Stay (HOSPITAL_COMMUNITY)
Admission: EM | Admit: 2022-08-18 | Discharge: 2022-08-20 | DRG: 638 | Disposition: A | Discharge status: Home / Self Care | Payer: BC Managed Care – PPO | Attending: Internal Medicine | Admitting: Internal Medicine

## 2022-08-18 DIAGNOSIS — Z833 Family history of diabetes mellitus: Secondary | ICD-10-CM | POA: Diagnosis not present

## 2022-08-18 DIAGNOSIS — E1043 Type 1 diabetes mellitus with diabetic autonomic (poly)neuropathy: Secondary | ICD-10-CM | POA: Diagnosis present

## 2022-08-18 DIAGNOSIS — N179 Acute kidney failure, unspecified: Secondary | ICD-10-CM | POA: Diagnosis present

## 2022-08-18 DIAGNOSIS — E1110 Type 2 diabetes mellitus with ketoacidosis without coma: Secondary | ICD-10-CM | POA: Diagnosis present

## 2022-08-18 DIAGNOSIS — K3184 Gastroparesis: Secondary | ICD-10-CM | POA: Diagnosis present

## 2022-08-18 DIAGNOSIS — E860 Dehydration: Secondary | ICD-10-CM | POA: Diagnosis present

## 2022-08-18 DIAGNOSIS — E1010 Type 1 diabetes mellitus with ketoacidosis without coma: Principal | ICD-10-CM | POA: Diagnosis present

## 2022-08-18 DIAGNOSIS — R000 Tachycardia, unspecified: Secondary | ICD-10-CM | POA: Diagnosis present

## 2022-08-18 DIAGNOSIS — F1210 Cannabis abuse, uncomplicated: Secondary | ICD-10-CM

## 2022-08-18 DIAGNOSIS — E876 Hypokalemia: Secondary | ICD-10-CM | POA: Diagnosis not present

## 2022-08-18 DIAGNOSIS — Z79899 Other long term (current) drug therapy: Secondary | ICD-10-CM | POA: Diagnosis not present

## 2022-08-18 DIAGNOSIS — Z9641 Presence of insulin pump (external) (internal): Secondary | ICD-10-CM | POA: Diagnosis present

## 2022-08-18 DIAGNOSIS — Z794 Long term (current) use of insulin: Secondary | ICD-10-CM | POA: Diagnosis not present

## 2022-08-18 DIAGNOSIS — R112 Nausea with vomiting, unspecified: Secondary | ICD-10-CM | POA: Diagnosis present

## 2022-08-18 DIAGNOSIS — E871 Hypo-osmolality and hyponatremia: Secondary | ICD-10-CM

## 2022-08-18 DIAGNOSIS — E119 Type 2 diabetes mellitus without complications: Secondary | ICD-10-CM | POA: Diagnosis present

## 2022-08-18 DIAGNOSIS — E109 Type 1 diabetes mellitus without complications: Secondary | ICD-10-CM | POA: Diagnosis present

## 2022-08-18 DIAGNOSIS — E1143 Type 2 diabetes mellitus with diabetic autonomic (poly)neuropathy: Secondary | ICD-10-CM | POA: Diagnosis present

## 2022-08-18 DIAGNOSIS — I152 Hypertension secondary to endocrine disorders: Secondary | ICD-10-CM | POA: Diagnosis present

## 2022-08-18 DIAGNOSIS — R1115 Cyclical vomiting syndrome unrelated to migraine: Principal | ICD-10-CM

## 2022-08-18 DIAGNOSIS — E1159 Type 2 diabetes mellitus with other circulatory complications: Secondary | ICD-10-CM | POA: Diagnosis present

## 2022-08-18 LAB — BLOOD GAS, ARTERIAL
Acid-base deficit: 7.6 mmol/L — ABNORMAL HIGH (ref 0.0–2.0)
Bicarbonate: 14.6 mmol/L — ABNORMAL LOW (ref 20.0–28.0)
Drawn by: 23281
FIO2: 21 %
O2 Saturation: 100 %
Patient temperature: 37.6
pCO2 arterial: 23 mmHg — ABNORMAL LOW (ref 32–48)
pH, Arterial: 7.42 (ref 7.35–7.45)
pO2, Arterial: 120 mmHg — ABNORMAL HIGH (ref 83–108)

## 2022-08-18 LAB — BASIC METABOLIC PANEL
Anion gap: 13 (ref 5–15)
Anion gap: 13 (ref 5–15)
BUN: 10 mg/dL (ref 6–20)
BUN: 8 mg/dL (ref 6–20)
CO2: 22 mmol/L (ref 22–32)
CO2: 24 mmol/L (ref 22–32)
Calcium: 9 mg/dL (ref 8.9–10.3)
Calcium: 9 mg/dL (ref 8.9–10.3)
Chloride: 102 mmol/L (ref 98–111)
Chloride: 100 mmol/L (ref 98–111)
Creatinine, Ser: 0.8 mg/dL (ref 0.61–1.24)
Creatinine, Ser: 0.75 mg/dL (ref 0.61–1.24)
GFR, Estimated: >60 mL/min (ref >60)
GFR, Estimated: >60 mL/min (ref >60)
Glucose, Bld: 130 mg/dL — ABNORMAL HIGH (ref 70–99)
Glucose, Bld: 123 mg/dL — ABNORMAL HIGH (ref 70–99)
Potassium: 3.3 mmol/L — ABNORMAL LOW (ref 3.5–5.1)
Potassium: 3.2 mmol/L — ABNORMAL LOW (ref 3.5–5.1)
Sodium: 137 mmol/L (ref 135–145)
Sodium: 137 mmol/L (ref 135–145)

## 2022-08-18 LAB — CBG MONITORING, ED
Glucose-Capillary: 124 mg/dL — ABNORMAL HIGH (ref 70–99)
Glucose-Capillary: 141 mg/dL — ABNORMAL HIGH (ref 70–99)
Glucose-Capillary: 143 mg/dL — ABNORMAL HIGH (ref 70–99)
Glucose-Capillary: 166 mg/dL — ABNORMAL HIGH (ref 70–99)
Glucose-Capillary: 172 mg/dL — ABNORMAL HIGH (ref 70–99)
Glucose-Capillary: 196 mg/dL — ABNORMAL HIGH (ref 70–99)
Glucose-Capillary: 217 mg/dL — ABNORMAL HIGH (ref 70–99)
Glucose-Capillary: 226 mg/dL — ABNORMAL HIGH (ref 70–99)
Glucose-Capillary: 230 mg/dL — ABNORMAL HIGH (ref 70–99)
Glucose-Capillary: 237 mg/dL — ABNORMAL HIGH (ref 70–99)
Glucose-Capillary: 241 mg/dL — ABNORMAL HIGH (ref 70–99)
Glucose-Capillary: 269 mg/dL — ABNORMAL HIGH (ref 70–99)
Glucose-Capillary: 286 mg/dL — ABNORMAL HIGH (ref 70–99)
Glucose-Capillary: 332 mg/dL — ABNORMAL HIGH (ref 70–99)
Glucose-Capillary: 469 mg/dL — ABNORMAL HIGH (ref 70–99)
Glucose-Capillary: 486 mg/dL — ABNORMAL HIGH (ref 70–99)
Glucose-Capillary: 489 mg/dL — ABNORMAL HIGH (ref 70–99)

## 2022-08-18 LAB — CBC
HCT: 40.2 % (ref 39.0–52.0)
Hemoglobin: 13.9 g/dL (ref 13.0–17.0)
MCH: 30.2 pg (ref 26.0–34.0)
MCHC: 34.6 g/dL (ref 30.0–36.0)
MCV: 87.4 fL (ref 80.0–100.0)
Platelets: 283 10*3/uL (ref 150–400)
RBC: 4.6 MIL/uL (ref 4.22–5.81)
RDW: 12.7 % (ref 11.5–15.5)
WBC: 19.3 10*3/uL — ABNORMAL HIGH (ref 4.0–10.5)
nRBC: 0 % (ref 0.0–0.2)

## 2022-08-18 LAB — CBC WITH DIFFERENTIAL/PLATELET
Abs Immature Granulocytes: 0.05 10*3/uL (ref 0.00–0.07)
Basophils Absolute: 0 10*3/uL (ref 0.0–0.1)
Basophils Relative: 0 %
Eosinophils Absolute: 0 10*3/uL (ref 0.0–0.5)
Eosinophils Relative: 0 %
HCT: 44.7 % (ref 39.0–52.0)
Hemoglobin: 15.5 g/dL (ref 13.0–17.0)
Immature Granulocytes: 0 %
Lymphocytes Relative: 13 %
Lymphs Abs: 1.8 10*3/uL (ref 0.7–4.0)
MCH: 30.3 pg (ref 26.0–34.0)
MCHC: 34.7 g/dL (ref 30.0–36.0)
MCV: 87.5 fL (ref 80.0–100.0)
Monocytes Absolute: 0.2 10*3/uL (ref 0.1–1.0)
Monocytes Relative: 2 %
Neutro Abs: 12 10*3/uL — ABNORMAL HIGH (ref 1.7–7.7)
Neutrophils Relative %: 85 %
Platelets: 308 10*3/uL (ref 150–400)
RBC: 5.11 MIL/uL (ref 4.22–5.81)
RDW: 12.3 % (ref 11.5–15.5)
WBC: 14.1 10*3/uL — ABNORMAL HIGH (ref 4.0–10.5)
nRBC: 0 % (ref 0.0–0.2)

## 2022-08-18 LAB — COMPREHENSIVE METABOLIC PANEL
ALT: 24 U/L (ref 0–44)
AST: 32 U/L (ref 15–41)
Albumin: 4.7 g/dL (ref 3.5–5.0)
Alkaline Phosphatase: 98 U/L (ref 38–126)
Anion gap: 24 — ABNORMAL HIGH (ref 5–15)
BUN: 16 mg/dL (ref 6–20)
CO2: 15 mmol/L — ABNORMAL LOW (ref 22–32)
Calcium: 9.3 mg/dL (ref 8.9–10.3)
Chloride: 95 mmol/L — ABNORMAL LOW (ref 98–111)
Creatinine, Ser: 1.43 mg/dL — ABNORMAL HIGH (ref 0.61–1.24)
GFR, Estimated: >60 mL/min (ref >60)
Glucose, Bld: 478 mg/dL — ABNORMAL HIGH (ref 70–99)
Potassium: 4.1 mmol/L (ref 3.5–5.1)
Sodium: 134 mmol/L — ABNORMAL LOW (ref 135–145)
Total Bilirubin: 1.8 mg/dL — ABNORMAL HIGH (ref 0.3–1.2)
Total Protein: 8.9 g/dL — ABNORMAL HIGH (ref 6.5–8.1)

## 2022-08-18 LAB — URINALYSIS, ROUTINE W REFLEX MICROSCOPIC
Bacteria, UA: NONE SEEN
Bilirubin Urine: NEGATIVE
Glucose, UA: >=500 mg/dL — AB
Ketones, ur: 80 mg/dL — AB
Leukocytes,Ua: NEGATIVE
Nitrite: NEGATIVE
Protein, ur: 100 mg/dL — AB
Specific Gravity, Urine: 1.032 — ABNORMAL HIGH (ref 1.005–1.030)
pH: 5 (ref 5.0–8.0)

## 2022-08-18 LAB — BETA-HYDROXYBUTYRIC ACID
Beta-Hydroxybutyric Acid: 4.6 mmol/L — ABNORMAL HIGH (ref 0.05–0.27)
Beta-Hydroxybutyric Acid: 1.32 mmol/L — ABNORMAL HIGH (ref 0.05–0.27)
Beta-Hydroxybutyric Acid: 2.1 mmol/L — ABNORMAL HIGH (ref 0.05–0.27)

## 2022-08-18 LAB — RAPID URINE DRUG SCREEN, HOSP PERFORMED
Amphetamines: NOT DETECTED
Barbiturates: NOT DETECTED
Benzodiazepines: NOT DETECTED
Cocaine: NOT DETECTED
Opiates: NOT DETECTED
Tetrahydrocannabinol: POSITIVE — AB

## 2022-08-18 MED ORDER — ACETAMINOPHEN 325 MG PO TABS
650.0000 mg | ORAL_TABLET | Freq: Four times a day (QID) | ORAL | Status: DC | PRN
  Administered 2022-08-18 – 2022-08-20 (×3): 650 mg via ORAL
  Filled 2022-08-18 (×3): qty 2

## 2022-08-18 MED ORDER — DEXTROSE IN LACTATED RINGERS 5 % IV SOLN: INTRAVENOUS | Status: DC

## 2022-08-18 MED ORDER — ZOLPIDEM TARTRATE 5 MG PO TABS
10.0000 mg | ORAL_TABLET | Freq: Every evening | ORAL | Status: DC | PRN
  Administered 2022-08-19: 10 mg via ORAL
  Filled 2022-08-18: qty 2

## 2022-08-18 MED ORDER — DEXTROSE 50 % IV SOLN: 0.0000 mL | INTRAVENOUS | Status: DC | PRN

## 2022-08-18 MED ORDER — DROPERIDOL 2.5 MG/ML IJ SOLN
1.2500 mg | Freq: Once | INTRAMUSCULAR | Status: AC
  Administered 2022-08-18: 1.25 mg via INTRAVENOUS
  Filled 2022-08-18: qty 2

## 2022-08-18 MED ORDER — ONDANSETRON HCL 4 MG/2ML IJ SOLN
INTRAMUSCULAR | Status: AC
  Administered 2022-08-18: 4 mg via INTRAVENOUS
  Filled 2022-08-18: qty 2

## 2022-08-18 MED ORDER — POTASSIUM CHLORIDE 10 MEQ/100ML IV SOLN
10.0000 meq | INTRAVENOUS | Status: AC
  Administered 2022-08-18 (×3): 10 meq via INTRAVENOUS
  Filled 2022-08-18 (×2): qty 100

## 2022-08-18 MED ORDER — ENOXAPARIN SODIUM 40 MG/0.4ML IJ SOSY
40.0000 mg | PREFILLED_SYRINGE | INTRAMUSCULAR | Status: DC
  Administered 2022-08-18: 40 mg via SUBCUTANEOUS
  Filled 2022-08-18 (×3): qty 0.4

## 2022-08-18 MED ORDER — LACTATED RINGERS IV BOLUS
20.0000 mL/kg | Freq: Once | INTRAVENOUS | Status: AC
  Administered 2022-08-18: 1360 mL via INTRAVENOUS

## 2022-08-18 MED ORDER — INSULIN ASPART 100 UNIT/ML IJ SOLN
0.0000 [IU] | Freq: Three times a day (TID) | INTRAMUSCULAR | Status: DC
  Administered 2022-08-19 (×2): 8 [IU] via SUBCUTANEOUS
  Administered 2022-08-19: 5 [IU] via SUBCUTANEOUS
  Administered 2022-08-20: 8 [IU] via SUBCUTANEOUS
  Filled 2022-08-18: qty 0.15

## 2022-08-18 MED ORDER — LACTATED RINGERS IV SOLN: INTRAVENOUS | Status: DC

## 2022-08-18 MED ORDER — ONDANSETRON HCL 4 MG/2ML IJ SOLN
4.0000 mg | Freq: Four times a day (QID) | INTRAMUSCULAR | Status: DC | PRN
  Administered 2022-08-18 – 2022-08-20 (×5): 4 mg via INTRAVENOUS
  Filled 2022-08-18 (×5): qty 2

## 2022-08-18 MED ORDER — LACTATED RINGERS IV BOLUS: 1000.0000 mL | Freq: Once | INTRAVENOUS | Status: DC

## 2022-08-18 MED ORDER — INSULIN ASPART 100 UNIT/ML IJ SOLN
0.0000 [IU] | Freq: Every day | INTRAMUSCULAR | Status: DC
  Administered 2022-08-19: 2 [IU] via SUBCUTANEOUS
  Filled 2022-08-18: qty 0.05

## 2022-08-18 MED ORDER — METOPROLOL TARTRATE 5 MG/5ML IV SOLN
5.0000 mg | INTRAVENOUS | Status: DC | PRN
  Administered 2022-08-18 – 2022-08-19 (×5): 5 mg via INTRAVENOUS
  Filled 2022-08-18 (×5): qty 5

## 2022-08-18 MED ORDER — INSULIN GLARGINE-YFGN 100 UNIT/ML ~~LOC~~ SOLN
15.0000 [IU] | SUBCUTANEOUS | Status: DC
  Administered 2022-08-19 (×2): 15 [IU] via SUBCUTANEOUS
  Filled 2022-08-18 (×3): qty 0.15

## 2022-08-18 MED ORDER — POTASSIUM CHLORIDE CRYS ER 20 MEQ PO TBCR
40.0000 meq | EXTENDED_RELEASE_TABLET | Freq: Once | ORAL | Status: DC
  Filled 2022-08-18: qty 2

## 2022-08-18 MED ORDER — INSULIN REGULAR(HUMAN) IN NACL 100-0.9 UT/100ML-% IV SOLN
INTRAVENOUS | Status: DC
  Administered 2022-08-18: 2.4 [IU]/h via INTRAVENOUS
  Administered 2022-08-18: 9.5 [IU]/h via INTRAVENOUS
  Administered 2022-08-18: 8 [IU]/h via INTRAVENOUS
  Administered 2022-08-18: 8.5 [IU]/h via INTRAVENOUS
  Filled 2022-08-18 (×2): qty 100

## 2022-08-18 MED ORDER — ONDANSETRON HCL 4 MG/2ML IJ SOLN: 4.0000 mg | Freq: Once | INTRAMUSCULAR | Status: AC

## 2022-08-18 MED ORDER — SODIUM CHLORIDE 0.9 % IV BOLUS
1000.0000 mL | Freq: Once | INTRAVENOUS | Status: AC
  Administered 2022-08-18: 1000 mL via INTRAVENOUS

## 2022-08-18 MED ORDER — SODIUM CHLORIDE 0.9 % IV SOLN
12.5000 mg | Freq: Four times a day (QID) | INTRAVENOUS | Status: DC | PRN
  Administered 2022-08-18 – 2022-08-19 (×3): 12.5 mg via INTRAVENOUS
  Filled 2022-08-18 (×3): qty 12.5

## 2022-08-18 NOTE — ED Triage Notes (Signed)
Patient BIB POV. Patient was actively vomiting and confused. Patient reports nausea and vomiting x 1 week. Patient has hx of type 1 diabetes and reports his sugars are normally 150s-200s. On arrival BGL was 489.

## 2022-08-18 NOTE — H&P (Signed)
History and Physical  Audi Harlin [id number] DOB: January 16, 1996 DOA: 08/18/2022  PCP: Everrett Coombe, MD   Chief Complaint: n/v   HPI: Mason Mclaughlin is a 27 y.o. male with medical history significant for insulin-dependent type 1 diabetes with insulin pump, complicated by hypertension, diabetic gastroparesis admitted to the hospital with diabetic ketoacidosis.  Unfortunately he has intractable nausea and vomiting which is recurrent for him.  He was found to have elevated glucose of 478, creatinine 1.43, anion gap 24, beta hydroxybutyrate of 4.6.  UDS positive for marijuana.  Patient's nausea vomiting improved with droperidol.  Additionally, he is hypertensive with blood pressure as high as 199/123 heart rate 120.  His insulin pump has been turned off, he was placed on insulin drip, and DKA protocol was started in the ER.  Hospitalist was contacted for admission.   Currently he is nauseous, getting some Zofran.  Denies abdominal pain, denies blood in his stool or emesis.  Says is quite clearly typical of his prior admissions for intractable vomiting, and DKA.  Review of Systems: Please see HPI for pertinent positives and negatives. A complete 10 system review of systems are otherwise negative.  Past Medical History:  Diagnosis Date   Diabetes ([place])    DKA (diabetic ketoacidoses)    DKA, type 1 (HCC)    Insulin pump in place    History reviewed. No pertinent surgical history.  Social History:  reports that he has never smoked. He has never used smokeless tobacco. He reports current alcohol use. He reports current drug use. Frequency: 4.00 times per week. Drug: Marijuana.   No Known Allergies  Family History  Problem Relation Age of Onset   Healthy Mother    Diabetes Father      Prior to Admission medications   Medication Sig Start Date End Date Taking? Authorizing Provider  Insulin Human (INSULIN PUMP) SOLN Inject into the skin continuous. Novolog    [provider]  lisinopril (ZESTRIL) 20 MG tablet Take 1 tablet by mouth every morning. 01/19/22   Shelly Coss, MD  ondansetron (ZOFRAN) 4 MG tablet Take 1 tablet (4 mg total) by mouth every 8 (eight) hours as needed for up to 20 doses for nausea or vomiting. Patient not taking: Reported on 01/16/2022 02/15/21   Arnaldo Natal, MD  promethazine (PHENERGAN) 25 MG tablet Take 1 tablet (25 mg total) by mouth every 6 (six) hours as needed for nausea or vomiting. Patient not taking: Reported on 01/16/2022 07/19/21   Lennice Sites, DO    Physical Exam: BP (!) 164/123   Pulse (!) 135   Temp 99 F (37.2 C) (Oral)   Resp 19   Ht 5\' 5"  (1.651 m)   Wt 68 kg   SpO2 97%   BMI 24.95 kg/m   General:  Alert, oriented, calm, in no acute distress  Eyes: EOMI, clear conjuctivae, white sclerea Neck: supple, no masses, trachea mildline  Cardiovascular: RRR, no murmurs or rubs, no peripheral edema  Respiratory: clear to auscultation bilaterally, no wheezes, no crackles  Abdomen: soft, nontender, nondistended, normal bowel tones heard  Skin: dry, no rashes  Musculoskeletal: no joint effusions, normal range of motion  Psychiatric: appropriate affect, normal speech  Neurologic: extraocular muscles intact, clear speech, moving all extremities with intact sensorium          Labs on Admission:  Basic Metabolic Panel: Recent Labs  Lab 08/18/22 0056  NA 134*  K 4.1  CL 95*  CO2 15*  GLUCOSE  478*  BUN 16  CREATININE 1.43*  CALCIUM 9.3   Liver Function Tests: Recent Labs  Lab 08/18/22 0056  AST 32  ALT 24  ALKPHOS 98  BILITOT 1.8*  PROT 8.9*  ALBUMIN 4.7   No results for input(s): "LIPASE", "AMYLASE" in the last 168 hours. No results for input(s): "AMMONIA" in the last 168 hours. CBC: Recent Labs  Lab 08/18/22 0056  WBC 14.1*  NEUTROABS 12.0*  HGB 15.5  HCT 44.7  MCV 87.5  PLT 308   Cardiac Enzymes: No results for input(s): "CKTOTAL", "CKMB", "CKMBINDEX", "TROPONINI" in the  last 168 hours.  BNP (last 3 results) No results for input(s): "BNP" in the last 8760 hours.  ProBNP (last 3 results) No results for input(s): "PROBNP" in the last 8760 hours.  CBG: Recent Labs  Lab 08/18/22 0259 08/18/22 0414 08/18/22 0533 08/18/22 0642 08/18/22 0803  GLUCAP 486* 469* 332* 269* 196*    Radiological Exams on Admission: No results found.  Assessment/Plan Principal Problem:   DKA (diabetic ketoacidosis) ([place]) -patient presents in diabetic ketoacidosis probably due to nausea vomiting and the resulting dehydration.  He has the attendant complications as noted below. -Stepdown admission inpatient status -Keep n.p.o. -Insulin drip per protocol -IV fluids per protocol -Symptomatic treatment with IV Zofran as needed for nausea -Blood pressure is elevated, likely due to missing his home lisinopril, as well as his discomfort; as needed metoprolol ordered -She has AKI from dehydration, avoid nephrotoxins and check renal function in the morning, he is being hydrated copiously in the meantime -Pseudohyponatremia due to hyperglycemia, will be followed with every 4 hours DKA labs -Will transition cautiously to clear liquid diet and advance, once anion gap is closed x 2  Active Problems:   AKI (acute kidney injury) ([place])   Hypertension complicating diabetes ([place])   Nausea & vomiting   Type 1 diabetes mellitus not at goal [facility])   Diabetic gastroparesis ([place]) Hyponatremia  DVT prophylaxis: Lovenox   Full code  Consults called: None  Admission status: The appropriate patient status for this patient is INPATIENT. Inpatient status is judged to be reasonable and necessary in order to provide the required intensity of service to ensure the patient's safety. The patient's presenting symptoms, physical exam findings, and initial radiographic and laboratory data in the context of their chronic comorbidities is felt to place them at high risk for further clinical deterioration.  Furthermore, it is not anticipated that the patient will be medically stable for discharge from the hospital within 2 midnights of admission.    I certify that at the point of admission it is my clinical judgment that the patient will require inpatient hospital care spanning beyond 2 midnights from the point of admission due to high intensity of service, high risk for further deterioration and high frequency of surveillance required   Time spent: 43 minutes  Negar Sieler Neva Seat MD [facility] Pager [phone]  If 7PM-7AM, please contact night-coverage [email or web address] Password [facility]  08/18/2022, 8:58 AM

## 2022-08-18 NOTE — ED Notes (Signed)
Patient ambulating in hallway.

## 2022-08-18 NOTE — Inpatient Diabetes Management (Signed)
Inpatient Diabetes Program Recommendations  AACE/ADA: New Consensus Statement on Inpatient Glycemic Control (2015)  Target Ranges:  Prepandial:   less than 140 mg/dL      Peak postprandial:   less than 180 mg/dL (1-2 hours)      Critically ill patients:  140 - 180 mg/dL   Lab Results  Component Value Date   GLUCAP 226 (H) 08/18/2022   HGBA1C 9.4 (H) 01/17/2022    Review of Glycemic Control  Diabetes history: DM1 Outpatient Diabetes medications:  Time Basal ICR ISF IOB Target  00:00 1.5   30:1 100-110  06:30 1.4   30:1 100-110  12:00 1.4   30:1 100-110  23:00 1.15 30:1 100-110  Total basal-34.125  See's [facility].  Last visit was in February.   Current orders for Inpatient glycemic control: IV insulin  Inpatient Diabetes Program Recommendations:    Once acidosis has cleared and MD is ready to transition off of IV insulin please consider allowing patient to start his insulin pump with his Dexcom G6.  Allow IV insulin to run for 1 hour while pump is running then turn of Endotool.  Spoke with patient at bedside.  He was diagnosed at age 27 with T1DM.  He is current with [facility].  Last visit was in February and he has a scheduled follow up in early summer.    He uses auto-mode with the Tandem insulin pump and Dexcom G6 CGM.  This is a closed loop system that allows the pump to increase or decrease insulin as needed based on his BG.  His Transmitter for the Dexcom ran out of days and he has been without his CGM for a few days.  He states he believes this is why he came in with DKA.  His mother has obtained a new transmitter and will bring it to the hospital prior to DC.  He has his pump, insulin and pump supplies with his belongings.    Will continue to follow while inpatient.  Thank you, [name], MSN, [place] Diabetes Coordinator Inpatient Diabetes Program [phone] (team pager from 8a-5p)

## 2022-08-18 NOTE — Progress Notes (Signed)
27 year old male with history of diabetes mellitus on insulin pump, history of DKA episodes, history intractable nausea and vomiting, hypertension.  He is brought to the ER with intractable nausea and vomiting, found to be in DKA.  Glucose 478, creatinine 1.43, anion gap 24, beta hydroxy butyrate acid 4.6, UDS positive for marijuana.  Patient's nausea and vomiting resolved with droperidol.  Patient additionally hypertensive presenting blood pressure 199/123, HR 120, RR 22.  Patient insulin pump is off.  DKA protocol started in ER.  Admission requested

## 2022-08-18 NOTE — ED Provider Notes (Addendum)
[place] EMERGENCY DEPARTMENT AT [facility] Provider Note   CSN: [id number] Arrival date & time: 08/18/22  0046     History  Chief Complaint  Patient presents with   Hyperglycemia   Emesis    Mason Mclaughlin is a 27 y.o. male.  The history is provided by the patient. The history is limited by the condition of the patient.  Emesis Severity:  Severe Duration:  1 month Timing:  Intermittent Quality:  Stomach contents Progression:  Unchanged Chronicity:  Recurrent Recent urination:  Normal Relieved by:  Nothing Worsened by:  Nothing Associated symptoms: no abdominal pain and no fever   Risk factors: diabetes   Patient with DM and chronic cyclic vomiting whose pump has fallen off is here elevated glucose and emesis x 1 week.      Home Medications Prior to Admission medications   Medication Sig Start Date End Date Taking? Authorizing Provider  Insulin Human (INSULIN PUMP) SOLN Inject into the skin continuous. Novolog    [provider]  lisinopril (ZESTRIL) 20 MG tablet Take 1 tablet by mouth every morning. 01/19/22   Shelly Coss, MD  ondansetron (ZOFRAN) 4 MG tablet Take 1 tablet (4 mg total) by mouth every 8 (eight) hours as needed for up to 20 doses for nausea or vomiting. Patient not taking: Reported on 01/16/2022 02/15/21   Arnaldo Natal, MD  promethazine (PHENERGAN) 25 MG tablet Take 1 tablet (25 mg total) by mouth every 6 (six) hours as needed for nausea or vomiting. Patient not taking: Reported on 01/16/2022 07/19/21   Lennice Sites, DO      Allergies    Patient has no known allergies.    Review of Systems   Review of Systems  Constitutional:  Negative for fever.  HENT:  Negative for facial swelling.   Eyes:  Negative for redness.  Gastrointestinal:  Positive for nausea and vomiting. Negative for abdominal pain.  All other systems reviewed and are negative.   Physical Exam Updated Vital Signs BP (!) 179/117   Pulse (!) 137    Resp (!) 24   Ht 5\' 5"  (1.651 m)   Wt 68 kg   SpO2 96%   BMI 24.95 kg/m  Physical Exam Vitals and nursing note reviewed. Exam conducted with a chaperone present.  Constitutional:      General: He is not in acute distress.    Appearance: He is well-developed. He is not diaphoretic.  HENT:     Head: Normocephalic and atraumatic.     Nose: Nose normal.  Eyes:     Conjunctiva/sclera: Conjunctivae normal.     Pupils: Pupils are equal, round, and reactive to light.  Cardiovascular:     Rate and Rhythm: Regular rhythm. Tachycardia present.     Pulses: Normal pulses.     Heart sounds: Normal heart sounds.  Pulmonary:     Effort: Pulmonary effort is normal.     Breath sounds: Normal breath sounds. No wheezing or rales.  Abdominal:     General: Abdomen is flat. Bowel sounds are normal.     Palpations: Abdomen is soft.     Tenderness: There is no abdominal tenderness. There is no guarding or rebound.  Musculoskeletal:        General: Normal range of motion.     Cervical back: Normal range of motion and neck supple.  Skin:    General: Skin is warm and dry.  Neurological:     Mental Status: He is alert and  oriented to person, place, and time.     ED Results / Procedures / Treatments   Labs (all labs ordered are listed, but only abnormal results are displayed) Results for orders placed or performed during the hospital encounter of 08/18/22  CBC with Differential  Result Value Ref Range   WBC 14.1 (H) 4.0 - 10.5 K/uL   RBC 5.11 4.22 - 5.81 MIL/uL   Hemoglobin 15.5 13.0 - 17.0 g/dL   HCT 44.7 39.0 - 52.0 %   MCV 87.5 80.0 - 100.0 fL   MCH 30.3 26.0 - 34.0 pg   MCHC 34.7 30.0 - 36.0 g/dL   RDW 12.3 11.5 - 15.5 %   Platelets 308 150 - 400 K/uL   nRBC 0.0 0.0 - 0.2 %   Neutrophils Relative % 85 %   Neutro Abs 12.0 (H) 1.7 - 7.7 K/uL   Lymphocytes Relative 13 %   Lymphs Abs 1.8 0.7 - 4.0 K/uL   Monocytes Relative 2 %   Monocytes Absolute 0.2 0.1 - 1.0 K/uL   Eosinophils  Relative 0 %   Eosinophils Absolute 0.0 0.0 - 0.5 K/uL   Basophils Relative 0 %   Basophils Absolute 0.0 0.0 - 0.1 K/uL   Immature Granulocytes 0 %   Abs Immature Granulocytes 0.05 0.00 - 0.07 K/uL  Comprehensive metabolic panel  Result Value Ref Range   Sodium 134 (L) 135 - 145 mmol/L   Potassium 4.1 3.5 - 5.1 mmol/L   Chloride 95 (L) 98 - 111 mmol/L   CO2 15 (L) 22 - 32 mmol/L   Glucose, Bld 478 (H) 70 - 99 mg/dL   BUN 16 6 - 20 mg/dL   Creatinine, Ser 1.43 (H) 0.61 - 1.24 mg/dL   Calcium 9.3 8.9 - 10.3 mg/dL   Total Protein 8.9 (H) 6.5 - 8.1 g/dL   Albumin 4.7 3.5 - 5.0 g/dL   AST 32 15 - 41 U/L   ALT 24 0 - 44 U/L   Alkaline Phosphatase 98 38 - 126 U/L   Total Bilirubin 1.8 (H) 0.3 - 1.2 mg/dL   GFR, Estimated >60 >60 mL/min   Anion gap 24 (H) 5 - 15  Urinalysis, Routine w reflex microscopic -Urine, Clean Catch  Result Value Ref Range   Color, Urine YELLOW YELLOW   APPearance CLEAR CLEAR   Specific Gravity, Urine 1.032 (H) 1.005 - 1.030   pH 5.0 5.0 - 8.0   Glucose, UA >=500 (A) NEGATIVE mg/dL   Hgb urine dipstick MODERATE (A) NEGATIVE   Bilirubin Urine NEGATIVE NEGATIVE   Ketones, ur 80 (A) NEGATIVE mg/dL   Protein, ur 100 (A) NEGATIVE mg/dL   Nitrite NEGATIVE NEGATIVE   Leukocytes,Ua NEGATIVE NEGATIVE   RBC / HPF 6-10 0 - 5 RBC/hpf   WBC, UA 0-5 0 - 5 WBC/hpf   Bacteria, UA NONE SEEN NONE SEEN   Squamous Epithelial / HPF 0-5 0 - 5 /HPF  Beta-hydroxybutyric acid  Result Value Ref Range   Beta-Hydroxybutyric Acid 4.60 (H) 0.05 - 0.27 mmol/L  Blood gas, arterial (at [facility])  Result Value Ref Range   FIO2 21 %   Mode ROOM AIR    pH, Arterial 7.42 7.35 - 7.45   pCO2 arterial 23 (L) 32 - 48 mmHg   pO2, Arterial 120 (H) 83 - 108 mmHg   Bicarbonate 14.6 (L) 20.0 - 28.0 mmol/L   Acid-base deficit 7.6 (H) 0.0 - 2.0 mmol/L   O2 Saturation 100 %  Patient temperature 37.6    Collection site RIGHT RADIAL    Drawn by [id number]    Allens test (pass/fail) PASS PASS  Rapid  urine drug screen (hospital performed)  Result Value Ref Range   Opiates NONE DETECTED NONE DETECTED   Cocaine NONE DETECTED NONE DETECTED   Benzodiazepines NONE DETECTED NONE DETECTED   Amphetamines NONE DETECTED NONE DETECTED   Tetrahydrocannabinol POSITIVE (A) NONE DETECTED   Barbiturates NONE DETECTED NONE DETECTED  CBG monitoring, ED  Result Value Ref Range   Glucose-Capillary 489 (H) 70 - 99 mg/dL   Comment 1 Notify RN   CBG monitoring, ED  Result Value Ref Range   Glucose-Capillary 486 (H) 70 - 99 mg/dL  CBG monitoring, ED  Result Value Ref Range   Glucose-Capillary 469 (H) 70 - 99 mg/dL   No results found.   EKG  EKG Interpretation  Date/Time:  Wednesday August 18 2022 01:12:00 EDT Ventricular Rate:  128 PR Interval:  108 QRS Duration: 87 QT Interval:  303 QTC Calculation: 443 R Axis:   84 Text Interpretation: Sinus tachycardia LVH by voltage Confirmed by Randal Buba, Emmagrace Runkel ([id number]) on 08/18/2022 4:47:55 AM         Radiology No results found.  Procedures Procedures    Medications Ordered in ED Medications  lactated ringers bolus 1,360 mL (has no administration in time range)  insulin regular, human (MYXREDLIN) 100 units/ 100 mL infusion (9.5 Units/hr Intravenous New Bag/Given 08/18/22 0416)  lactated ringers infusion (has no administration in time range)  dextrose 5 % in lactated ringers infusion (has no administration in time range)  dextrose 50 % solution 0-50 mL (has no administration in time range)  sodium chloride 0.9 % bolus 1,000 mL (0 mLs Intravenous Stopped 08/18/22 0314)  ondansetron (ZOFRAN) injection 4 mg (4 mg Intravenous Given 08/18/22 0125)  droperidol (INAPSINE) 2.5 MG/ML injection 1.25 mg (1.25 mg Intravenous Given 08/18/22 0135)    ED Course/ Medical Decision Making/ A&P                             Medical Decision Making Insulin pump off, has a history of chronic emesis seen at [facility]   Amount and/or Complexity of Data Reviewed External  Data Reviewed: notes.    Details: Previous notes reviewed  Labs: ordered.    Details: All labs reviewed: white count elevated 14.1, normal hemoglobin 15.5, normal platelet.  Sodium 134, normal potassium 4.1, markedly elevated glucose 478, elevated creatinine 1.43, glucose and 80 ketones in the urine, elevated beta hydroxybutyrate consistent with DKA, uds positive for marijuana.  Low bicarb on ABG.    Risk Prescription drug management. Decision regarding hospitalization.  Critical Care Total time providing critical care: 60 minutes (Consults, bedside care, potential for serious outcomes, insulin drip )  CRITICAL CARE Performed by: Kaeli Nichelson K Sienna Stonehocker-Rasch Total critical care time: 60 minutes Critical care time was exclusive of separately billable procedures and treating other patients. Critical care was necessary to treat or prevent imminent or life-threatening deterioration. Critical care was time spent personally by me on the following activities: development of treatment plan with patient and/or surrogate as well as nursing, discussions with consultants, evaluation of patient's response to treatment, examination of patient, obtaining history from patient or surrogate, ordering and performing treatments and interventions, ordering and review of laboratory studies, ordering and review of radiographic studies, pulse oximetry and re-evaluation of patient's condition.  Final Clinical Impression(s) / ED Diagnoses Final diagnoses:  Cyclic vomiting syndrome  Diabetic ketoacidosis without coma associated with type 1 diabetes mellitus ([place])   The patient appears reasonably stabilized for admission considering the current resources, flow, and capabilities available in the ED at this time, and I doubt any other [facility] requiring further screening and/or treatment in the ED prior to admission.  Rx / DC Orders ED Discharge Orders     None            Emaree Chiu, MD 08/18/22 [id number]

## 2022-08-18 NOTE — ED Notes (Signed)
At assessment RN noticed patient had fever and BP had increased, RN gave PRN medications

## 2022-08-19 DIAGNOSIS — E1010 Type 1 diabetes mellitus with ketoacidosis without coma: Secondary | ICD-10-CM | POA: Diagnosis not present

## 2022-08-19 LAB — BASIC METABOLIC PANEL
Anion gap: 15 (ref 5–15)
BUN: 8 mg/dL (ref 6–20)
CO2: 24 mmol/L (ref 22–32)
Calcium: 8.8 mg/dL — ABNORMAL LOW (ref 8.9–10.3)
Chloride: 93 mmol/L — ABNORMAL LOW (ref 98–111)
Creatinine, Ser: 0.69 mg/dL (ref 0.61–1.24)
GFR, Estimated: >60 mL/min (ref >60)
Glucose, Bld: 275 mg/dL — ABNORMAL HIGH (ref 70–99)
Potassium: 3.1 mmol/L — ABNORMAL LOW (ref 3.5–5.1)
Sodium: 132 mmol/L — ABNORMAL LOW (ref 135–145)

## 2022-08-19 LAB — CBG MONITORING, ED
Glucose-Capillary: 160 mg/dL — ABNORMAL HIGH (ref 70–99)
Glucose-Capillary: 165 mg/dL — ABNORMAL HIGH (ref 70–99)

## 2022-08-19 LAB — GLUCOSE, CAPILLARY
Glucose-Capillary: 251 mg/dL — ABNORMAL HIGH (ref 70–99)
Glucose-Capillary: 257 mg/dL — ABNORMAL HIGH (ref 70–99)
Glucose-Capillary: 204 mg/dL — ABNORMAL HIGH (ref 70–99)
Glucose-Capillary: 222 mg/dL — ABNORMAL HIGH (ref 70–99)

## 2022-08-19 LAB — BETA-HYDROXYBUTYRIC ACID: Beta-Hydroxybutyric Acid: 2.7 mmol/L — ABNORMAL HIGH (ref 0.05–0.27)

## 2022-08-19 LAB — HEMOGLOBIN A1C
Hgb A1c MFr Bld: 9 % — ABNORMAL HIGH (ref 4.8–5.6)
Mean Plasma Glucose: 212 mg/dL

## 2022-08-19 MED ORDER — POTASSIUM CHLORIDE 20 MEQ PO PACK
40.0000 meq | PACK | Freq: Once | ORAL | Status: AC
  Administered 2022-08-19: 40 meq via ORAL
  Filled 2022-08-19: qty 2

## 2022-08-19 MED ORDER — INSULIN ASPART 100 UNIT/ML IJ SOLN
4.0000 [IU] | Freq: Three times a day (TID) | INTRAMUSCULAR | Status: DC
  Administered 2022-08-19 – 2022-08-20 (×2): 4 [IU] via SUBCUTANEOUS

## 2022-08-19 MED ORDER — METOCLOPRAMIDE HCL 5 MG/ML IJ SOLN
10.0000 mg | Freq: Four times a day (QID) | INTRAMUSCULAR | Status: DC | PRN
  Administered 2022-08-19 – 2022-08-20 (×2): 10 mg via INTRAVENOUS
  Filled 2022-08-19 (×3): qty 2

## 2022-08-19 MED ORDER — CARVEDILOL 6.25 MG PO TABS
6.2500 mg | ORAL_TABLET | Freq: Two times a day (BID) | ORAL | Status: DC
  Administered 2022-08-19: 6.25 mg via ORAL
  Filled 2022-08-19: qty 1

## 2022-08-19 MED ORDER — LABETALOL HCL 5 MG/ML IV SOLN
10.0000 mg | INTRAVENOUS | Status: DC | PRN
  Administered 2022-08-19: 10 mg via INTRAVENOUS
  Filled 2022-08-19: qty 4

## 2022-08-19 MED ORDER — LISINOPRIL 20 MG PO TABS
20.0000 mg | ORAL_TABLET | Freq: Every morning | ORAL | Status: DC
  Administered 2022-08-19: 20 mg via ORAL
  Filled 2022-08-19: qty 1

## 2022-08-19 MED ORDER — ORAL CARE MOUTH RINSE: 15.0000 mL | OROMUCOSAL | Status: DC | PRN

## 2022-08-19 MED ORDER — ALUM & MAG HYDROXIDE-SIMETH 200-200-20 MG/5ML PO SUSP
30.0000 mL | ORAL | Status: DC | PRN
  Administered 2022-08-19 (×2): 30 mL via ORAL
  Filled 2022-08-19 (×2): qty 30

## 2022-08-19 MED ORDER — SODIUM CHLORIDE 0.9 % IV SOLN: INTRAVENOUS | Status: DC

## 2022-08-19 MED ORDER — AMLODIPINE BESYLATE 10 MG PO TABS
10.0000 mg | ORAL_TABLET | Freq: Every day | ORAL | Status: DC
  Administered 2022-08-19: 10 mg via ORAL
  Filled 2022-08-19: qty 1

## 2022-08-19 NOTE — Inpatient Diabetes Management (Signed)
Inpatient Diabetes Program Recommendations  AACE/ADA: New Consensus Statement on Inpatient Glycemic Control (2015)  Target Ranges:  Prepandial:   less than 140 mg/dL      Peak postprandial:   less than 180 mg/dL (1-2 hours)      Critically ill patients:  140 - 180 mg/dL   Lab Results  Component Value Date   GLUCAP 251 (H) 08/19/2022   HGBA1C 9.0 (H) 08/18/2022    Review of Glycemic Control   Diabetes history: DM1 Outpatient Diabetes medications:  Time Basal ICR ISF IOB Target  00:00 1.5   30:1 100-110  06:30 1.4   30:1 100-110  12:00 1.4   30:1 100-110  23:00 1.15 30:1 100-110  Total basal-34.125  See's [facility].  Last visit was in February.   Current orders for Inpatient glycemic control: Semglee 15 units QD, Novolog 0-15 units TID and 0-5 units QHS  Inpatient Diabetes Program Recommendations:    He will need to wait approximately 24 hrs from basal dose before placing insulin pump back on.  He could potentially start insulin pump later tonight.  Basal was administered at 00:31 on 3/28.    Once eating please add Novolog 4 units TID with meals if he consumes at least 50%.  Will continue to follow while inpatient.  Thank you, [name], MSN, [place] Diabetes Coordinator Inpatient Diabetes Program [phone] (team pager from 8a-5p)

## 2022-08-19 NOTE — Plan of Care (Signed)
  Problem: Metabolic: Goal: Ability to maintain appropriate glucose levels will improve Outcome: Progressing   Problem: Education: Goal: Knowledge of General Education information will improve Description: Including pain rating scale, medication(s)/side effects and non-pharmacologic comfort measures Outcome: Completed/Met

## 2022-08-19 NOTE — Plan of Care (Signed)
  Problem: Fluid Volume: Goal: Ability to maintain a balanced intake and output will improve Outcome: Progressing   Problem: Tissue Perfusion: Goal: Adequacy of tissue perfusion will improve Outcome: Progressing   

## 2022-08-19 NOTE — ED Notes (Signed)
ED TO INPATIENT HANDOFF REPORT  ED Nurse Name and Phone #: [phone] Orbie Hurst Name/Age/Gender Mason Mclaughlin 27 y.o. male Room/Bed: WA25/WA25  Code Status   Code Status: Full Code  Home/SNF/Other Home Patient oriented to: self, place, time, and situation Is this baseline? Yes   Triage Complete: Triage complete  Chief Complaint DKA (diabetic ketoacidosis) ([place]) [E11.10]  Triage Note Patient BIB POV. Patient was actively vomiting and confused. Patient reports nausea and vomiting x 1 week. Patient has hx of type 1 diabetes and reports his sugars are normally 150s-200s. On arrival BGL was 489.    Allergies No Known Allergies  Level of Care/Admitting Diagnosis ED Disposition     ED Disposition  Admit   Condition  --   Comment  Hospital Area: [place] [id number]  Level of Care: Telemetry [5]  Admit to tele based on following criteria: Monitor for Ischemic changes  May admit patient to [facility] or [facility] if equivalent level of care is available:: Yes  Covid Evaluation: Asymptomatic - no recent exposure (last 10 days) testing not required  Diagnosis: DKA (diabetic ketoacidosis) [facility]) [id number]  Admitting Physician: Lucillie Garfinkel [id number]  Attending Physician: [place], MIR Kari.Conine [id number]  Certification:: I certify this patient will need inpatient services for at least 2 midnights          B Medical/Surgery History Past Medical History:  Diagnosis Date   Diabetes ([place])    DKA (diabetic ketoacidoses)    DKA, type 1 ([place])    Insulin pump in place    History reviewed. No pertinent surgical history.   A IV Location/Drains/Wounds Patient Lines/Drains/Airways Status     Active Line/Drains/Airways     Name Placement date Placement time Site Days   Peripheral IV 08/18/22 20 G 1" Anterior;Distal;Right;Upper Arm 08/18/22  0107  Arm  1   Peripheral IV 08/18/22 18 G Left Forearm 08/18/22  0500  Forearm  1            Intake/Output  Last 24 hours  Intake/Output Summary (Last 24 hours) at 08/19/2022 0520 Last data filed at 08/18/2022 1710 Gross per 24 hour  Intake 3293.56 ml  Output --  Net 3293.56 ml    Labs/Imaging Results for orders placed or performed during the hospital encounter of 08/18/22 (from the past 48 hour(s))  CBG monitoring, ED     Status: Abnormal   Collection Time: 08/18/22 12:51 AM  Result Value Ref Range   Glucose-Capillary 489 (H) 70 - 99 mg/dL    Comment: Glucose reference range applies only to samples taken after fasting for at least 8 hours.   Comment 1 Notify RN   CBC with Differential     Status: Abnormal   Collection Time: 08/18/22 12:56 AM  Result Value Ref Range   WBC 14.1 (H) 4.0 - 10.5 K/uL   RBC 5.11 4.22 - 5.81 MIL/uL   Hemoglobin 15.5 13.0 - 17.0 g/dL   HCT 44.7 39.0 - 52.0 %   MCV 87.5 80.0 - 100.0 fL   MCH 30.3 26.0 - 34.0 pg   MCHC 34.7 30.0 - 36.0 g/dL   RDW 12.3 11.5 - 15.5 %   Platelets 308 150 - 400 K/uL   nRBC 0.0 0.0 - 0.2 %   Neutrophils Relative % 85 %   Neutro Abs 12.0 (H) 1.7 - 7.7 K/uL   Lymphocytes Relative 13 %   Lymphs Abs 1.8 0.7 - 4.0 K/uL   Monocytes Relative 2 %  Monocytes Absolute 0.2 0.1 - 1.0 K/uL   Eosinophils Relative 0 %   Eosinophils Absolute 0.0 0.0 - 0.5 K/uL   Basophils Relative 0 %   Basophils Absolute 0.0 0.0 - 0.1 K/uL   Immature Granulocytes 0 %   Abs Immature Granulocytes 0.05 0.00 - 0.07 K/uL    Comment: Performed at [facility], [place] [address] [place], [place] [address]  Comprehensive metabolic panel     Status: Abnormal   Collection Time: 08/18/22 12:56 AM  Result Value Ref Range   Sodium 134 (L) 135 - 145 mmol/L   Potassium 4.1 3.5 - 5.1 mmol/L   Chloride 95 (L) 98 - 111 mmol/L   CO2 15 (L) 22 - 32 mmol/L   Glucose, Bld 478 (H) 70 - 99 mg/dL    Comment: Glucose reference range applies only to samples taken after fasting for at least 8 hours.   BUN 16 6 - 20 mg/dL   Creatinine, Ser 1.43 (H) 0.61 - 1.24  mg/dL   Calcium 9.3 8.9 - 10.3 mg/dL   Total Protein 8.9 (H) 6.5 - 8.1 g/dL   Albumin 4.7 3.5 - 5.0 g/dL   AST 32 15 - 41 U/L   ALT 24 0 - 44 U/L   Alkaline Phosphatase 98 38 - 126 U/L   Total Bilirubin 1.8 (H) 0.3 - 1.2 mg/dL   GFR, Estimated >60 >60 mL/min    Comment: (NOTE) Calculated using the CKD-EPI Creatinine Equation (2021)    Anion gap 24 (H) 5 - 15    Comment: ELECTROLYTES REPEATED TO VERIFY Performed at [facility] [address], [place], [place] [address]   Beta-hydroxybutyric acid     Status: Abnormal   Collection Time: 08/18/22 12:56 AM  Result Value Ref Range   Beta-Hydroxybutyric Acid 4.60 (H) 0.05 - 0.27 mmol/L    Comment: RESULT CONFIRMED BY MANUAL DILUTION Performed at [facility] [address], [place], [place] [address]   Blood gas, arterial (at [facility])     Status: Abnormal   Collection Time: 08/18/22  1:13 AM  Result Value Ref Range   FIO2 21 %   Mode ROOM AIR    pH, Arterial 7.42 7.35 - 7.45   pCO2 arterial 23 (L) 32 - 48 mmHg   pO2, Arterial 120 (H) 83 - 108 mmHg   Bicarbonate 14.6 (L) 20.0 - 28.0 mmol/L   Acid-base deficit 7.6 (H) 0.0 - 2.0 mmol/L   O2 Saturation 100 %   Patient temperature 37.6    Collection site RIGHT RADIAL    Drawn by [id number]    Allens test (pass/fail) PASS PASS    Comment: Performed at [facility], [place] [address], [place], [place] [address]  Urinalysis, Routine w reflex microscopic -Urine, Clean Catch     Status: Abnormal   Collection Time: 08/18/22  1:15 AM  Result Value Ref Range   Color, Urine YELLOW YELLOW   APPearance CLEAR CLEAR   Specific Gravity, Urine 1.032 (H) 1.005 - 1.030   pH 5.0 5.0 - 8.0   Glucose, UA >=500 (A) NEGATIVE mg/dL   Hgb urine dipstick MODERATE (A) NEGATIVE   Bilirubin Urine NEGATIVE NEGATIVE   Ketones, ur 80 (A) NEGATIVE mg/dL   Protein, ur 100 (A) NEGATIVE mg/dL   Nitrite NEGATIVE NEGATIVE   Leukocytes,Ua NEGATIVE NEGATIVE   RBC  / HPF 6-10 0 - 5 RBC/hpf   WBC, UA 0-5 0 - 5 WBC/hpf  Bacteria, UA NONE SEEN NONE SEEN   Squamous Epithelial / HPF 0-5 0 - 5 /HPF    Comment: Performed at [facility], [place] [address], [place], [place] [address]  Rapid urine drug screen (hospital performed)     Status: Abnormal   Collection Time: 08/18/22  1:15 AM  Result Value Ref Range   Opiates NONE DETECTED NONE DETECTED   Cocaine NONE DETECTED NONE DETECTED   Benzodiazepines NONE DETECTED NONE DETECTED   Amphetamines NONE DETECTED NONE DETECTED   Tetrahydrocannabinol POSITIVE (A) NONE DETECTED   Barbiturates NONE DETECTED NONE DETECTED    Comment: (NOTE) DRUG SCREEN FOR MEDICAL PURPOSES ONLY.  IF CONFIRMATION IS NEEDED FOR ANY PURPOSE, NOTIFY LAB WITHIN 5 DAYS.  LOWEST DETECTABLE LIMITS FOR URINE DRUG SCREEN Drug Class                     Cutoff (ng/mL) Amphetamine and metabolites    1000 Barbiturate and metabolites    200 Benzodiazepine                 200 Opiates and metabolites        300 Cocaine and metabolites        300 THC                            50 Performed at [facility], [place] [address] [place], [place] [address]   CBG monitoring, ED     Status: Abnormal   Collection Time: 08/18/22  2:59 AM  Result Value Ref Range   Glucose-Capillary 486 (H) 70 - 99 mg/dL    Comment: Glucose reference range applies only to samples taken after fasting for at least 8 hours.  CBG monitoring, ED     Status: Abnormal   Collection Time: 08/18/22  4:14 AM  Result Value Ref Range   Glucose-Capillary 469 (H) 70 - 99 mg/dL    Comment: Glucose reference range applies only to samples taken after fasting for at least 8 hours.  CBG monitoring, ED     Status: Abnormal   Collection Time: 08/18/22  5:33 AM  Result Value Ref Range   Glucose-Capillary 332 (H) 70 - 99 mg/dL    Comment: Glucose reference range applies only to samples taken after fasting for at least 8 hours.   Comment 1 Notify RN    POC CBG, ED     Status: Abnormal   Collection Time: 08/18/22  6:42 AM  Result Value Ref Range   Glucose-Capillary 269 (H) 70 - 99 mg/dL    Comment: Glucose reference range applies only to samples taken after fasting for at least 8 hours.   Comment 1 Document in Chart   CBG monitoring, ED     Status: Abnormal   Collection Time: 08/18/22  8:03 AM  Result Value Ref Range   Glucose-Capillary 196 (H) 70 - 99 mg/dL    Comment: Glucose reference range applies only to samples taken after fasting for at least 8 hours.  CBG monitoring, ED     Status: Abnormal   Collection Time: 08/18/22  9:04 AM  Result Value Ref Range   Glucose-Capillary 237 (H) 70 - 99 mg/dL    Comment: Glucose reference range applies only to samples taken after fasting for at least 8 hours.  CBG monitoring, ED     Status: Abnormal   Collection Time: 08/18/22 10:09 AM  Result Value Ref Range  Glucose-Capillary 217 (H) 70 - 99 mg/dL    Comment: Glucose reference range applies only to samples taken after fasting for at least 8 hours.  CBG monitoring, ED     Status: Abnormal   Collection Time: 08/18/22 11:00 AM  Result Value Ref Range   Glucose-Capillary 143 (H) 70 - 99 mg/dL    Comment: Glucose reference range applies only to samples taken after fasting for at least 8 hours.  CBG monitoring, ED     Status: Abnormal   Collection Time: 08/18/22 12:13 PM  Result Value Ref Range   Glucose-Capillary 226 (H) 70 - 99 mg/dL    Comment: Glucose reference range applies only to samples taken after fasting for at least 8 hours.  CBG monitoring, ED     Status: Abnormal   Collection Time: 08/18/22  1:17 PM  Result Value Ref Range   Glucose-Capillary 241 (H) 70 - 99 mg/dL    Comment: Glucose reference range applies only to samples taken after fasting for at least 8 hours.  CBG monitoring, ED     Status: Abnormal   Collection Time: 08/18/22  2:32 PM  Result Value Ref Range   Glucose-Capillary 172 (H) 70 - 99 mg/dL    Comment:  Glucose reference range applies only to samples taken after fasting for at least 8 hours.  CBG monitoring, ED     Status: Abnormal   Collection Time: 08/18/22  3:34 PM  Result Value Ref Range   Glucose-Capillary 124 (H) 70 - 99 mg/dL    Comment: Glucose reference range applies only to samples taken after fasting for at least 8 hours.  CBC     Status: Abnormal   Collection Time: 08/18/22  3:49 PM  Result Value Ref Range   WBC 19.3 (H) 4.0 - 10.5 K/uL   RBC 4.60 4.22 - 5.81 MIL/uL   Hemoglobin 13.9 13.0 - 17.0 g/dL   HCT 40.2 39.0 - 52.0 %   MCV 87.4 80.0 - 100.0 fL   MCH 30.2 26.0 - 34.0 pg   MCHC 34.6 30.0 - 36.0 g/dL   RDW 12.7 11.5 - 15.5 %   Platelets 283 150 - 400 K/uL   nRBC 0.0 0.0 - 0.2 %    Comment: Performed at [facility], [place] [address] [place], [place] [id number]  Basic metabolic panel     Status: Abnormal   Collection Time: 08/18/22  3:49 PM  Result Value Ref Range   Sodium 137 135 - 145 mmol/L   Potassium 3.3 (L) 3.5 - 5.1 mmol/L   Chloride 102 98 - 111 mmol/L   CO2 22 22 - 32 mmol/L   Glucose, Bld 130 (H) 70 - 99 mg/dL    Comment: Glucose reference range applies only to samples taken after fasting for at least 8 hours.   BUN 10 6 - 20 mg/dL   Creatinine, Ser 0.80 0.61 - 1.24 mg/dL   Calcium 9.0 8.9 - 10.3 mg/dL   GFR, Estimated >60 >60 mL/min    Comment: (NOTE) Calculated using the CKD-EPI Creatinine Equation (2021)    Anion gap 13 5 - 15    Comment: Performed at [facility], [place] [address] [place], [place] [address]  Beta-hydroxybutyric acid     Status: Abnormal   Collection Time: 08/18/22  3:49 PM  Result Value Ref Range   Beta-Hydroxybutyric Acid 1.32 (H) 0.05 - 0.27 mmol/L    Comment: Performed at [facility], [place] [address]  [place], [place] [address]  CBG monitoring, ED     Status: Abnormal   Collection Time: 08/18/22  4:39 PM  Result Value Ref Range   Glucose-Capillary 286 (H) 70 - 99  mg/dL    Comment: Glucose reference range applies only to samples taken after fasting for at least 8 hours.  CBG monitoring, ED     Status: Abnormal   Collection Time: 08/18/22  5:48 PM  Result Value Ref Range   Glucose-Capillary 230 (H) 70 - 99 mg/dL    Comment: Glucose reference range applies only to samples taken after fasting for at least 8 hours.  CBG monitoring, ED     Status: Abnormal   Collection Time: 08/18/22  7:00 PM  Result Value Ref Range   Glucose-Capillary 166 (H) 70 - 99 mg/dL    Comment: Glucose reference range applies only to samples taken after fasting for at least 8 hours.  CBG monitoring, ED     Status: Abnormal   Collection Time: 08/18/22  9:06 PM  Result Value Ref Range   Glucose-Capillary 141 (H) 70 - 99 mg/dL    Comment: Glucose reference range applies only to samples taken after fasting for at least 8 hours.  Basic metabolic panel     Status: Abnormal   Collection Time: 08/18/22 10:34 PM  Result Value Ref Range   Sodium 137 135 - 145 mmol/L   Potassium 3.2 (L) 3.5 - 5.1 mmol/L   Chloride 100 98 - 111 mmol/L   CO2 24 22 - 32 mmol/L   Glucose, Bld 123 (H) 70 - 99 mg/dL    Comment: Glucose reference range applies only to samples taken after fasting for at least 8 hours.   BUN 8 6 - 20 mg/dL   Creatinine, Ser 0.75 0.61 - 1.24 mg/dL   Calcium 9.0 8.9 - 10.3 mg/dL   GFR, Estimated >60 >60 mL/min    Comment: (NOTE) Calculated using the CKD-EPI Creatinine Equation (2021)    Anion gap 13 5 - 15    Comment: Performed at [facility], [place] [address], [place], [place] [address]  Beta-hydroxybutyric acid     Status: Abnormal   Collection Time: 08/18/22 10:34 PM  Result Value Ref Range   Beta-Hydroxybutyric Acid 2.10 (H) 0.05 - 0.27 mmol/L    Comment: Performed at [facility], [place] [address], [place], [place] [address]  CBG monitoring, ED     Status: Abnormal   Collection Time: 08/19/22 12:23 AM  Result Value Ref Range    Glucose-Capillary 160 (H) 70 - 99 mg/dL    Comment: Glucose reference range applies only to samples taken after fasting for at least 8 hours.  CBG monitoring, ED     Status: Abnormal   Collection Time: 08/19/22  3:19 AM  Result Value Ref Range   Glucose-Capillary 165 (H) 70 - 99 mg/dL    Comment: Glucose reference range applies only to samples taken after fasting for at least 8 hours.   No results found.  Pending Labs Unresulted Labs (From admission, onward)     Start     Ordered   08/25/22 0500  Creatinine, serum  (enoxaparin (LOVENOX)    CrCl >/= 30 ml/min)  Weekly,   R     Comments: while on enoxaparin therapy    08/18/22 0858   08/18/22 [id number]  Basic metabolic panel  ONCE - STAT,   STAT        08/18/22 2000   08/18/22 ID:[id number]  Basic metabolic panel  (Diabetes Ketoacidosis (DKA))  STAT Now then every 4 hours ,   R (with STAT occurrences)      08/18/22 0858   08/18/22 ID:[id number]  Beta-hydroxybutyric acid  (Diabetes Ketoacidosis (DKA))  Now then every 8 hours,   R (with TIMED occurrences)      08/18/22 0858   08/18/22 0858  Hemoglobin A1c  (Diabetes Ketoacidosis (DKA))  Once,   R       Comments: To assess prior glycemic control.    08/18/22 0858            Vitals/Pain Today's Vitals   08/18/22 1800 08/18/22 2224 08/18/22 2225 08/18/22 2235  BP:   (!) 179/123 (!) 172/138  Pulse:   (!) 117 (!) 127  Resp:   14 13  Temp: 98.6 F (37 C) 99 F (37.2 C)    TempSrc:  Oral    SpO2:   100% 98%  Weight:      Height:      PainSc:        Isolation Precautions No active isolations  Medications Medications  dextrose 50 % solution 0-50 mL (has no administration in time range)  lactated ringers bolus 1,000 mL (has no administration in time range)  metoprolol tartrate (LOPRESSOR) injection 5 mg (5 mg Intravenous Given 08/18/22 2322)  ondansetron (ZOFRAN) injection 4 mg (4 mg Intravenous Given 08/18/22 2322)  promethazine (PHENERGAN) 12.5 mg in sodium chloride 0.9 % 50 mL IVPB (0 mg  Intravenous Stopped 08/19/22 0456)  enoxaparin (LOVENOX) injection 40 mg (40 mg Subcutaneous Given 08/18/22 0930)  dextrose 50 % solution 0-50 mL (has no administration in time range)  acetaminophen (TYLENOL) tablet 650 mg (650 mg Oral Given 08/18/22 1322)  zolpidem (AMBIEN) tablet 10 mg (has no administration in time range)  insulin aspart (novoLOG) injection 0-15 Units (has no administration in time range)  insulin aspart (novoLOG) injection 0-5 Units ( Subcutaneous Not Given 08/18/22 2107)  insulin glargine-yfgn (SEMGLEE) injection 15 Units (15 Units Subcutaneous Given 08/19/22 0031)  potassium chloride SA (KLOR-CON M) CR tablet 40 mEq (40 mEq Oral Not Given 08/19/22 0115)  sodium chloride 0.9 % bolus 1,000 mL (0 mLs Intravenous Stopped 08/18/22 0314)  ondansetron (ZOFRAN) injection 4 mg (4 mg Intravenous Given 08/18/22 0125)  droperidol (INAPSINE) 2.5 MG/ML injection 1.25 mg (1.25 mg Intravenous Given 08/18/22 0135)  lactated ringers bolus 1,360 mL (0 mLs Intravenous Stopped 08/18/22 0606)  potassium chloride 10 mEq in 100 mL IVPB (0 mEq Intravenous Stopped 08/18/22 1215)    Mobility walks     Focused Assessments    R Recommendations: See Admitting Provider Note  Report given to:   Additional Notes:

## 2022-08-19 NOTE — Progress Notes (Signed)
  Transition of Care [facility]) Screening Note   Patient Details  Name: Mason Mclaughlin Date of Birth: 20-Jan-1996   Transition of Care [facility]) CM/SW Contact:    Phyllis Ginger, RN Phone Number: 08/19/2022, 8:28 AM    Transition of Care Department [facility]) has reviewed patient and no TOC needs have been identified at this time. We will continue to monitor patient advancement through interdisciplinary progression rounds. If new patient transition needs arise, please place a TOC consult.

## 2022-08-19 NOTE — Progress Notes (Signed)
  Transition of Care [facility]) Screening Note   Patient Details  Name: Mason Mclaughlin Date of Birth: 09-19-1995   Transition of Care [facility]) CM/SW Contact:    Phyllis Ginger, RN Phone Number: 08/19/2022, 8:27 AM    Transition of Care Department [facility]) has reviewed patient and no TOC needs have been identified at this time. We will continue to monitor patient advancement through interdisciplinary progression rounds. If new patient transition needs arise, please place a TOC consult.

## 2022-08-19 NOTE — Progress Notes (Signed)
PROGRESS NOTE  Jarryd Cieslinski  [id number] DOB: 02-07-1996 DOA: 08/18/2022 PCP: Everrett Coombe, MD   Brief Narrative: Patient is a 27 year old male with history of insulin-dependent diabetes type 1 on insulin pump, hypertension, diabetic gastroparesis who presented with intractable nausea, vomiting.  On presentation, lab work showed hyperglycemia, creatinine of 1.4, elevated anion gap.  UDS positive for marijuana.  Patient was admitted for the management of DKA, intractable nausea and vomiting.  He remained hypertensive and tachycardic.  Gap is closed, currently on long-acting and sliding scale.  Diabetic coordinator following.  Continue IV fluids for today  Assessment & Plan:  Principal Problem:   DKA (diabetic ketoacidosis) ([place]) Active Problems:   AKI (acute kidney injury) ([place])   Hypertension complicating diabetes ([place])   Nausea & vomiting   Type 1 diabetes mellitus not at goal [facility])   Diabetic gastroparesis ([place])   Hyponatremia  DKA: Presented with nausea, vomiting, abdominal pain.  Hyperglycemia, elevated anion gap on presentation.  Started on insulin drip.  Gap is closed.  Currently on sliding scale, long-acting insulin.  Diabetic monitor following.  A1c of 9.  Monitor blood sugars  Nausea/vomiting/abdominal pain: Likely secondary to diabetic gastroparesis.  Patient advised to take small volume nonfatty meals.  Started on Reglan.  These symptoms have improved today.  Severe hypertension: Patient remains severely hypertensive this morning.  Restarted home lisinopril.  Added amlodipine  Sinus tachycardia: Likely secondary to abdominal pain, nausea and vomiting.  Improving.  Continue IV fluids for today  Hypokalemia: Supplemented and being monitored  AKI: Resolved with IV fluids          DVT prophylaxis:enoxaparin (LOVENOX) injection 40 mg Start: 08/18/22 1000 SCDs Start: 08/18/22 0857     Code Status: Full Code  Family Communication: None at  bediside  Patient status:Inpatient  Patient is from :Home  Anticipated discharge [id number]  Estimated DC date:tomorrow   Consultants: None  Procedures:None  Antimicrobials:  Anti-infectives (From admission, onward)    None       Subjective: Patient seen and examined at bedside today.  Hemodynamically stable.  He vomited this morning.  During my evaluation, he was feeling better.  Diet advanced to carb consistent.  Denies any abdominal pain  Objective: Vitals:   08/19/22 0748 08/19/22 1142 08/19/22 1156 08/19/22 1305  BP: (!) 179/108 (!) 187/117 (!) 179/117 (!) 169/108  Pulse: 88 (!) 114 (!) 103 (!) 104  Resp: 18 12 16    Temp: 98.5 F (36.9 C) 98.1 F (36.7 C)    TempSrc: Oral Oral    SpO2: 99% 98%    Weight: 66.6 kg     Height: 5\' 6"  (1.676 m)       Intake/Output Summary (Last 24 hours) at 08/19/2022 1312 Last data filed at 08/19/2022 1100 Gross per 24 hour  Intake 2478 ml  Output --  Net 2478 ml   Filed Weights   08/18/22 0101 08/19/22 0748  Weight: 68 kg 66.6 kg    Examination:  General exam: Overall comfortable, not in distress HEENT: PERRL Respiratory system:  no wheezes or crackles  Cardiovascular system: Sinus tachycardia.  Gastrointestinal system: Abdomen is nondistended, soft and nontender. Central nervous system: Alert and oriented Extremities: No edema, no clubbing ,no cyanosis Skin: No rashes, no ulcers,no icterus     Data Reviewed: I have personally reviewed following labs and imaging studies  CBC: Recent Labs  Lab 08/18/22 0056 08/18/22 1549  WBC 14.1* 19.3*  NEUTROABS 12.0*  --   HGB 15.5 13.9  HCT 44.7 40.2  MCV 87.5 87.4  PLT 308 [id number]   Basic Metabolic Panel: Recent Labs  Lab 08/18/22 0056 08/18/22 1549 08/18/22 2234 08/19/22 0551  NA 134* 137 137 132*  K 4.1 3.3* 3.2* 3.1*  CL 95* 102 100 93*  CO2 15* 22 24 24   GLUCOSE 478* 130* 123* 275*  BUN 16 10 8 8   CREATININE 1.43* 0.80 0.75 0.69  CALCIUM 9.3 9.0 9.0 8.8*      No results found for this or any previous visit (from the past 240 hour(s)).   Radiology Studies: No results found.  Scheduled Meds:  amLODipine  10 mg Oral Daily   enoxaparin (LOVENOX) injection  40 mg Subcutaneous Q24H   insulin aspart  0-15 Units Subcutaneous TID WC   insulin aspart  0-5 Units Subcutaneous QHS   insulin aspart  4 Units Subcutaneous TID WC   insulin glargine-yfgn  15 Units Subcutaneous Q24H   lisinopril  20 mg Oral q morning   Continuous Infusions:  sodium chloride 75 mL/hr at 08/19/22 1209   lactated ringers       LOS: 1 day   Shelly Coss, MD [facility] P3/28/2024, 1:12 PM

## 2022-08-20 DIAGNOSIS — E1010 Type 1 diabetes mellitus with ketoacidosis without coma: Secondary | ICD-10-CM | POA: Diagnosis not present

## 2022-08-20 LAB — BASIC METABOLIC PANEL
Anion gap: 12 (ref 5–15)
BUN: 9 mg/dL (ref 6–20)
CO2: 26 mmol/L (ref 22–32)
Calcium: 8.7 mg/dL — ABNORMAL LOW (ref 8.9–10.3)
Chloride: 96 mmol/L — ABNORMAL LOW (ref 98–111)
Creatinine, Ser: 0.79 mg/dL (ref 0.61–1.24)
GFR, Estimated: >60 mL/min (ref >60)
Glucose, Bld: 295 mg/dL — ABNORMAL HIGH (ref 70–99)
Potassium: 3.2 mmol/L — ABNORMAL LOW (ref 3.5–5.1)
Sodium: 134 mmol/L — ABNORMAL LOW (ref 135–145)

## 2022-08-20 LAB — GLUCOSE, CAPILLARY: Glucose-Capillary: 295 mg/dL — ABNORMAL HIGH (ref 70–99)

## 2022-08-20 MED ORDER — CARVEDILOL 12.5 MG PO TABS
12.5000 mg | ORAL_TABLET | Freq: Two times a day (BID) | ORAL | Status: DC
  Administered 2022-08-20: 12.5 mg via ORAL
  Filled 2022-08-20: qty 1

## 2022-08-20 MED ORDER — PROMETHAZINE HCL 25 MG PO TABS: 25.0000 mg | ORAL_TABLET | Freq: Four times a day (QID) | ORAL | 0 refills | Status: DC | PRN

## 2022-08-20 MED ORDER — POTASSIUM CHLORIDE CRYS ER 20 MEQ PO TBCR
40.0000 meq | EXTENDED_RELEASE_TABLET | ORAL | Status: AC
  Administered 2022-08-20 (×2): 40 meq via ORAL
  Filled 2022-08-20 (×2): qty 2

## 2022-08-20 MED ORDER — ZOLPIDEM TARTRATE 10 MG PO TABS: 10.0000 mg | ORAL_TABLET | Freq: Every evening | ORAL | 0 refills | Status: DC | PRN

## 2022-08-20 MED ORDER — LISINOPRIL 40 MG PO TABS: 40.0000 mg | ORAL_TABLET | Freq: Every morning | ORAL | 0 refills | Status: DC

## 2022-08-20 MED ORDER — LISINOPRIL 20 MG PO TABS
40.0000 mg | ORAL_TABLET | Freq: Every morning | ORAL | Status: DC
  Administered 2022-08-20: 40 mg via ORAL
  Filled 2022-08-20: qty 2

## 2022-08-20 MED ORDER — ONDANSETRON HCL 4 MG PO TABS: 4.0000 mg | ORAL_TABLET | Freq: Three times a day (TID) | ORAL | 0 refills | Status: AC | PRN

## 2022-08-20 MED ORDER — FAMOTIDINE 40 MG PO TABS: 40.0000 mg | ORAL_TABLET | Freq: Every day | ORAL | 0 refills | Status: DC | PRN

## 2022-08-20 MED ORDER — POTASSIUM CHLORIDE CRYS ER 20 MEQ PO TBCR: 20.0000 meq | EXTENDED_RELEASE_TABLET | Freq: Every day | ORAL | 0 refills | Status: DC

## 2022-08-20 MED ORDER — CARVEDILOL 12.5 MG PO TABS: 12.5000 mg | ORAL_TABLET | Freq: Two times a day (BID) | ORAL | 0 refills | Status: DC

## 2022-08-20 NOTE — Plan of Care (Signed)
  Problem: Coping: Goal: Ability to adjust to condition or change in health will improve Outcome: Progressing   Problem: Fluid Volume: Goal: Ability to maintain a balanced intake and output will improve Outcome: Progressing   Problem: Nutritional: Goal: Maintenance of adequate nutrition will improve Outcome: Progressing   

## 2022-08-20 NOTE — Progress Notes (Signed)
AVS and discharge instructions reviewed w/ patient. Patient verbalized understanding and had no further questions. 

## 2022-08-20 NOTE — Discharge Summary (Addendum)
Physician Discharge Summary  Render Mason Mclaughlin DOB: 07/11/1995 DOA: 08/18/2022  PCP: Everrett Coombe, MD  Admit date: 08/18/2022 Discharge date: 08/20/2022  Admitted From: home Disposition:  home  Recommendations for Outpatient Follow-up:  Follow up with PCP in 1-2 weeks  Home Health: none Equipment/Devices: none  Discharge Condition: stable CODE STATUS: Full code Diet Orders (From admission, onward)     Start     Ordered   08/19/22 0934  Diet Carb Modified Fluid consistency: Thin; Room service appropriate? Yes  Diet effective now       Question Answer Comment  Diet-HS Snack? Nothing   Calorie Level Medium 1600-2000   Fluid consistency: Thin   Room service appropriate? Yes      08/19/22 0933           HPI: Per admitting MD, Mason Mclaughlin is a 27 y.o. male with medical history significant for insulin-dependent type 1 diabetes with insulin pump, complicated by hypertension, diabetic gastroparesis admitted to the hospital with diabetic ketoacidosis.  Unfortunately he has intractable nausea and vomiting which is recurrent for him.  He was found to have elevated glucose of 478, creatinine 1.43, anion gap 24, beta hydroxybutyrate of 4.6.  UDS positive for marijuana.  Patient's nausea vomiting improved with droperidol.  Additionally, he is hypertensive with blood pressure as high as 199/123 heart rate 120.  His insulin pump has been turned off, he was placed on insulin drip, and DKA protocol was started in the ER.  Hospitalist was contacted for admission. Currently he is nauseous, getting some Zofran.  Denies abdominal pain, denies blood in his stool or emesis.  Says is quite clearly typical of his prior admissions for intractable vomiting, and DKA.  Hospital Course / Discharge diagnoses: Principal Problem:   DKA (diabetic ketoacidosis) ([place]) Active Problems:   AKI (acute kidney injury) ([place])   Hypertension complicating diabetes ([place])   Nausea & vomiting    Type 1 diabetes mellitus not at goal [facility])   Diabetic gastroparesis ([place])   Hyponatremia   Principal problem Diabetic ketoacidosis in setting of type 1 diabetes mellitus -patient was initially admitted to stepdown and started on insulin drip.  He did not have any way of checking his sugars, which is likely the etiology for his DKA.  His Dexcom just arrived.  Gap is closed, will space on sliding scale, he has improved currently tolerating eating, and will be discharged home in stable condition.  Active problems Nausea, vomiting, in the setting of gastroparesis-improved Hypertension-quite severe at times, his home lisinopril was doubled and he was added on Coreg with improvement.  He is to continue this medications as an outpatient and prescription were sent to his pharmacy Sinus tachycardia-improved, due to abdominal pain.  Now on Coreg Hypokalemia-supplemented prior to discharge, he will be given 3 additional days of supplementation.  Recommend outpatient follow-up with PCP in a week and recheck labs Acute kidney injury-resolved with IV fluids  Sepsis ruled out   Discharge Instructions   Allergies as of 08/20/2022   No Known Allergies      Medication List     TAKE these medications    carvedilol 12.5 MG tablet Commonly known as: COREG Take 1 tablet (12.5 mg total) by mouth 2 (two) times daily with a meal.   insulin pump Soln Inject 1 each into the skin continuous. Medication: Novolog   lisinopril 40 MG tablet Commonly known as: ZESTRIL Take 1 tablet (40 mg total) by mouth every morning. What changed:  medication strength how much to take   ondansetron 4 MG tablet Commonly known as: ZOFRAN Take 1 tablet (4 mg total) by mouth every 8 (eight) hours as needed for up to 20 doses for nausea or vomiting.   potassium chloride SA 20 MEQ tablet Commonly known as: KLOR-CON M Take 1 tablet (20 mEq total) by mouth daily for 3 days.   promethazine 25 MG tablet Commonly known  as: PHENERGAN Take 1 tablet (25 mg total) by mouth every 6 (six) hours as needed for refractory nausea / vomiting. What changed: reasons to take this   zolpidem 10 MG tablet Commonly known as: AMBIEN Take 1 tablet (10 mg total) by mouth at bedtime as needed for sleep.         Consultations: none  Procedures/Studies:  No results found.   Subjective: - no chest pain, shortness of breath, no abdominal pain, nausea or vomiting.   Discharge Exam: BP (!) 145/99 (BP Location: Left Arm)   Pulse 98   Temp 98.1 F (36.7 C) (Oral) Comment: RN jenifer aware of current vital signs.  Resp 16   Ht 5\' 6"  (1.676 m)   Wt 66.6 kg   SpO2 100%   BMI 23.70 kg/m   General: Pt is alert, awake, not in acute distress Extremities: no edema, no cyanosis    The results of significant diagnostics from this hospitalization (including imaging, microbiology, ancillary and laboratory) are listed below for reference.     Microbiology: No results found for this or any previous visit (from the past 240 hour(s)).   Labs: Basic Metabolic Panel: Recent Labs  Lab 08/18/22 0056 08/18/22 1549 08/18/22 2234 08/19/22 0551 08/20/22 0445  NA 134* 137 137 132* 134*  K 4.1 3.3* 3.2* 3.1* 3.2*  CL 95* 102 100 93* 96*  CO2 15* 22 24 24 26   GLUCOSE 478* 130* 123* 275* 295*  BUN 16 10 8 8 9   CREATININE 1.43* 0.80 0.75 0.69 0.79  CALCIUM 9.3 9.0 9.0 8.8* 8.7*   Liver Function Tests: Recent Labs  Lab 08/18/22 0056  AST 32  ALT 24  ALKPHOS 98  BILITOT 1.8*  PROT 8.9*  ALBUMIN 4.7   CBC: Recent Labs  Lab 08/18/22 0056 08/18/22 1549  WBC 14.1* 19.3*  NEUTROABS 12.0*  --   HGB 15.5 13.9  HCT 44.7 40.2  MCV 87.5 87.4  PLT 308 283   CBG: Recent Labs  Lab 08/19/22 0726 08/19/22 1144 08/19/22 1703 08/19/22 2123 08/20/22 0714  GLUCAP 251* 257* 204* 222* 295*   Hgb A1c Recent Labs    08/18/22 1549  HGBA1C 9.0*   Lipid Profile No results for input(s): "CHOL", "HDL", "LDLCALC",  "TRIG", "CHOLHDL", "LDLDIRECT" in the last 72 hours. Thyroid function studies No results for input(s): "TSH", "T4TOTAL", "T3FREE", "THYROIDAB" in the last 72 hours.  Invalid input(s): "FREET3" Urinalysis    Component Value Date/Time   COLORURINE YELLOW 08/18/2022 0115   APPEARANCEUR CLEAR 08/18/2022 0115   LABSPEC 1.032 (H) 08/18/2022 0115   PHURINE 5.0 08/18/2022 0115   GLUCOSEU >=500 (A) 08/18/2022 0115   HGBUR MODERATE (A) 08/18/2022 0115   BILIRUBINUR NEGATIVE 08/18/2022 0115   KETONESUR 80 (A) 08/18/2022 0115   PROTEINUR 100 (A) 08/18/2022 0115   NITRITE NEGATIVE 08/18/2022 0115   LEUKOCYTESUR NEGATIVE 08/18/2022 0115    FURTHER DISCHARGE INSTRUCTIONS:   Get Medicines reviewed and adjusted: Please take all your medications with you for your next visit with your Primary MD   Laboratory/radiological data: Please request  your Primary MD to go over all hospital tests and procedure/radiological results at the follow up, please ask your Primary MD to get all Hospital records sent to his/her office.   In some cases, they will be blood work, cultures and biopsy results pending at the time of your discharge. Please request that your primary care M.D. goes through all the records of your hospital data and follows up on these results.   Also Note the following: If you experience worsening of your admission symptoms, develop shortness of breath, life threatening emergency, suicidal or homicidal thoughts you must seek medical attention immediately by calling 911 or calling your MD immediately  if symptoms less severe.   You must read complete instructions/literature along with all the possible adverse reactions/side effects for all the Medicines you take and that have been prescribed to you. Take any new Medicines after you have completely understood and accpet all the possible adverse reactions/side effects.    Do not drive when taking Pain medications or sleeping medications  (Benzodaizepines)   Do not take more than prescribed Pain, Sleep and Anxiety Medications. It is not advisable to combine anxiety,sleep and pain medications without talking with your primary care practitioner   Special Instructions: If you have smoked or chewed Tobacco  in the last 2 yrs please stop smoking, stop any regular Alcohol  and or any Recreational drug use.   Wear Seat belts while driving.   Please note: You were cared for by a hospitalist during your hospital stay. Once you are discharged, your primary care physician will handle any further medical issues. Please note that NO REFILLS for any discharge medications will be authorized once you are discharged, as it is imperative that you return to your primary care physician (or establish a relationship with a primary care physician if you do not have one) for your post hospital discharge needs so that they can reassess your need for medications and monitor your lab values.  Time coordinating discharge: 40 minutes  SIGNED:  Marzetta Board, MD, PhD 08/20/2022, 9:31 AM

## 2023-03-08 ENCOUNTER — Inpatient Hospital Stay (HOSPITAL_COMMUNITY)
Admission: EM | Admit: 2023-03-08 | Discharge: 2023-03-11 | DRG: 919 | Disposition: A | Discharge status: Home / Self Care | Payer: Self-pay | Attending: Pulmonary Disease | Admitting: Pulmonary Disease

## 2023-03-08 ENCOUNTER — Other Ambulatory Visit: Payer: Self-pay

## 2023-03-08 ENCOUNTER — Encounter (HOSPITAL_COMMUNITY): Payer: Self-pay

## 2023-03-08 ENCOUNTER — Emergency Department (HOSPITAL_COMMUNITY): Payer: Self-pay

## 2023-03-08 DIAGNOSIS — E1043 Type 1 diabetes mellitus with diabetic autonomic (poly)neuropathy: Secondary | ICD-10-CM | POA: Diagnosis present

## 2023-03-08 DIAGNOSIS — F419 Anxiety disorder, unspecified: Secondary | ICD-10-CM | POA: Diagnosis present

## 2023-03-08 DIAGNOSIS — N179 Acute kidney failure, unspecified: Secondary | ICD-10-CM | POA: Diagnosis present

## 2023-03-08 DIAGNOSIS — G9341 Metabolic encephalopathy: Secondary | ICD-10-CM | POA: Diagnosis present

## 2023-03-08 DIAGNOSIS — G4700 Insomnia, unspecified: Secondary | ICD-10-CM | POA: Diagnosis not present

## 2023-03-08 DIAGNOSIS — Z9641 Presence of insulin pump (external) (internal): Secondary | ICD-10-CM | POA: Diagnosis present

## 2023-03-08 DIAGNOSIS — E876 Hypokalemia: Secondary | ICD-10-CM | POA: Diagnosis not present

## 2023-03-08 DIAGNOSIS — E1110 Type 2 diabetes mellitus with ketoacidosis without coma: Secondary | ICD-10-CM | POA: Diagnosis present

## 2023-03-08 DIAGNOSIS — Z781 Physical restraint status: Secondary | ICD-10-CM

## 2023-03-08 DIAGNOSIS — D649 Anemia, unspecified: Secondary | ICD-10-CM | POA: Diagnosis present

## 2023-03-08 DIAGNOSIS — Z79899 Other long term (current) drug therapy: Secondary | ICD-10-CM

## 2023-03-08 DIAGNOSIS — E875 Hyperkalemia: Secondary | ICD-10-CM | POA: Diagnosis present

## 2023-03-08 DIAGNOSIS — Z794 Long term (current) use of insulin: Secondary | ICD-10-CM

## 2023-03-08 DIAGNOSIS — R000 Tachycardia, unspecified: Secondary | ICD-10-CM | POA: Diagnosis present

## 2023-03-08 DIAGNOSIS — E1010 Type 1 diabetes mellitus with ketoacidosis without coma: Principal | ICD-10-CM | POA: Diagnosis present

## 2023-03-08 DIAGNOSIS — D72829 Elevated white blood cell count, unspecified: Secondary | ICD-10-CM | POA: Diagnosis present

## 2023-03-08 DIAGNOSIS — T85614A Breakdown (mechanical) of insulin pump, initial encounter: Principal | ICD-10-CM | POA: Diagnosis present

## 2023-03-08 DIAGNOSIS — W19XXXA Unspecified fall, initial encounter: Secondary | ICD-10-CM | POA: Diagnosis not present

## 2023-03-08 DIAGNOSIS — I4891 Unspecified atrial fibrillation: Secondary | ICD-10-CM | POA: Diagnosis present

## 2023-03-08 DIAGNOSIS — Y742 Prosthetic and other implants, materials and accessory general hospital and personal-use devices associated with adverse incidents: Secondary | ICD-10-CM | POA: Diagnosis present

## 2023-03-08 DIAGNOSIS — E1011 Type 1 diabetes mellitus with ketoacidosis with coma: Secondary | ICD-10-CM

## 2023-03-08 DIAGNOSIS — E860 Dehydration: Secondary | ICD-10-CM | POA: Diagnosis present

## 2023-03-08 DIAGNOSIS — I10 Essential (primary) hypertension: Secondary | ICD-10-CM | POA: Diagnosis present

## 2023-03-08 DIAGNOSIS — E861 Hypovolemia: Secondary | ICD-10-CM | POA: Diagnosis present

## 2023-03-08 DIAGNOSIS — K3184 Gastroparesis: Secondary | ICD-10-CM | POA: Diagnosis present

## 2023-03-08 LAB — BLOOD GAS, VENOUS
Acid-base deficit: 22.7 mmol/L — ABNORMAL HIGH (ref 0.0–2.0)
Bicarbonate: 5.2 mmol/L — ABNORMAL LOW (ref 20.0–28.0)
O2 Saturation: 89.5 %
Patient temperature: 37
pCO2, Ven: <18 mm[Hg] — CL (ref 44–60)
pH, Ven: 7.09 — CL (ref 7.25–7.43)
pO2, Ven: 62 mm[Hg] — ABNORMAL HIGH (ref 32–45)

## 2023-03-08 LAB — BLOOD GAS, ARTERIAL
Acid-base deficit: 6.6 mmol/L — ABNORMAL HIGH (ref 0.0–2.0)
Bicarbonate: 17.2 mmol/L — ABNORMAL LOW (ref 20.0–28.0)
Drawn by: 23281
O2 Saturation: 99.8 %
Patient temperature: 37
pCO2 arterial: 29 mm[Hg] — ABNORMAL LOW (ref 32–48)
pH, Arterial: 7.38 (ref 7.35–7.45)
pO2, Arterial: 102 mm[Hg] (ref 83–108)

## 2023-03-08 LAB — GLUCOSE, CAPILLARY
Glucose-Capillary: 190 mg/dL — ABNORMAL HIGH (ref 70–99)
Glucose-Capillary: 230 mg/dL — ABNORMAL HIGH (ref 70–99)
Glucose-Capillary: 270 mg/dL — ABNORMAL HIGH (ref 70–99)
Glucose-Capillary: 324 mg/dL — ABNORMAL HIGH (ref 70–99)
Glucose-Capillary: 382 mg/dL — ABNORMAL HIGH (ref 70–99)
Glucose-Capillary: 426 mg/dL — ABNORMAL HIGH (ref 70–99)
Glucose-Capillary: 468 mg/dL — ABNORMAL HIGH (ref 70–99)
Glucose-Capillary: 552 mg/dL (ref 70–99)
Glucose-Capillary: >600 mg/dL (ref 70–99)
Glucose-Capillary: >600 mg/dL (ref 70–99)

## 2023-03-08 LAB — COMPREHENSIVE METABOLIC PANEL
ALT: 34 U/L (ref 0–44)
AST: 33 U/L (ref 15–41)
Albumin: 3.6 g/dL (ref 3.5–5.0)
Alkaline Phosphatase: 100 U/L (ref 38–126)
BUN: 80 mg/dL — ABNORMAL HIGH (ref 6–20)
CO2: <7 mmol/L — ABNORMAL LOW (ref 22–32)
Calcium: 7.7 mg/dL — ABNORMAL LOW (ref 8.9–10.3)
Chloride: 80 mmol/L — ABNORMAL LOW (ref 98–111)
Creatinine, Ser: 3.84 mg/dL — ABNORMAL HIGH (ref 0.61–1.24)
GFR, Estimated: 21 mL/min — ABNORMAL LOW (ref >60)
Glucose, Bld: >1200 mg/dL (ref 70–99)
Potassium: 6.6 mmol/L (ref 3.5–5.1)
Sodium: 123 mmol/L — ABNORMAL LOW (ref 135–145)
Total Bilirubin: 2.2 mg/dL — ABNORMAL HIGH (ref 0.3–1.2)
Total Protein: 7 g/dL (ref 6.5–8.1)

## 2023-03-08 LAB — ETHANOL: Alcohol, Ethyl (B): <10 mg/dL (ref <10)

## 2023-03-08 LAB — CBC WITH DIFFERENTIAL/PLATELET
Abs Immature Granulocytes: 0.63 10*3/uL — ABNORMAL HIGH (ref 0.00–0.07)
Basophils Absolute: 0.1 10*3/uL (ref 0.0–0.1)
Basophils Relative: 0 %
Eosinophils Absolute: 0 10*3/uL (ref 0.0–0.5)
Eosinophils Relative: 0 %
HCT: 39.3 % (ref 39.0–52.0)
Hemoglobin: 12.8 g/dL — ABNORMAL LOW (ref 13.0–17.0)
Immature Granulocytes: 2 %
Lymphocytes Relative: 3 %
Lymphs Abs: 0.9 10*3/uL (ref 0.7–4.0)
MCH: 30.5 pg (ref 26.0–34.0)
MCHC: 32.6 g/dL (ref 30.0–36.0)
MCV: 93.6 fL (ref 80.0–100.0)
Monocytes Absolute: 2.3 10*3/uL — ABNORMAL HIGH (ref 0.1–1.0)
Monocytes Relative: 8 %
Neutro Abs: 25.6 10*3/uL — ABNORMAL HIGH (ref 1.7–7.7)
Neutrophils Relative %: 87 %
Platelets: 290 10*3/uL (ref 150–400)
RBC: 4.2 MIL/uL — ABNORMAL LOW (ref 4.22–5.81)
RDW: 13.9 % (ref 11.5–15.5)
WBC: 29.5 10*3/uL — ABNORMAL HIGH (ref 4.0–10.5)
nRBC: 0 % (ref 0.0–0.2)

## 2023-03-08 LAB — BASIC METABOLIC PANEL
Anion gap: 32 — ABNORMAL HIGH (ref 5–15)
Anion gap: 22 — ABNORMAL HIGH (ref 5–15)
BUN: 75 mg/dL — ABNORMAL HIGH (ref 6–20)
BUN: 57 mg/dL — ABNORMAL HIGH (ref 6–20)
CO2: 9 mmol/L — ABNORMAL LOW (ref 22–32)
CO2: 16 mmol/L — ABNORMAL LOW (ref 22–32)
Calcium: 9 mg/dL (ref 8.9–10.3)
Calcium: 9.1 mg/dL (ref 8.9–10.3)
Chloride: 95 mmol/L — ABNORMAL LOW (ref 98–111)
Chloride: 105 mmol/L (ref 98–111)
Creatinine, Ser: 3.42 mg/dL — ABNORMAL HIGH (ref 0.61–1.24)
Creatinine, Ser: 2.69 mg/dL — ABNORMAL HIGH (ref 0.61–1.24)
GFR, Estimated: 24 mL/min — ABNORMAL LOW (ref >60)
GFR, Estimated: 32 mL/min — ABNORMAL LOW (ref >60)
Glucose, Bld: 705 mg/dL (ref 70–99)
Glucose, Bld: 340 mg/dL — ABNORMAL HIGH (ref 70–99)
Potassium: 4.1 mmol/L (ref 3.5–5.1)
Potassium: 4.1 mmol/L (ref 3.5–5.1)
Sodium: 136 mmol/L (ref 135–145)
Sodium: 143 mmol/L (ref 135–145)

## 2023-03-08 LAB — I-STAT CG4 LACTIC ACID, ED: Lactic Acid, Venous: 3.6 mmol/L (ref 0.5–1.9)

## 2023-03-08 LAB — URINALYSIS, ROUTINE W REFLEX MICROSCOPIC
Bacteria, UA: NONE SEEN
Bilirubin Urine: NEGATIVE
Glucose, UA: >=500 mg/dL — AB
Ketones, ur: 80 mg/dL — AB
Leukocytes,Ua: NEGATIVE
Nitrite: NEGATIVE
Protein, ur: NEGATIVE mg/dL
Specific Gravity, Urine: 1.016 (ref 1.005–1.030)
pH: 5 (ref 5.0–8.0)

## 2023-03-08 LAB — RAPID URINE DRUG SCREEN, HOSP PERFORMED
Amphetamines: NOT DETECTED
Barbiturates: NOT DETECTED
Benzodiazepines: NOT DETECTED
Cocaine: NOT DETECTED
Opiates: NOT DETECTED
Tetrahydrocannabinol: NOT DETECTED

## 2023-03-08 LAB — AMMONIA: Ammonia: 30 umol/L (ref 9–35)

## 2023-03-08 LAB — CBG MONITORING, ED
Glucose-Capillary: >600 mg/dL (ref 70–99)
Glucose-Capillary: >600 mg/dL (ref 70–99)
Glucose-Capillary: >600 mg/dL (ref 70–99)
Glucose-Capillary: >600 mg/dL (ref 70–99)
Glucose-Capillary: >600 mg/dL (ref 70–99)

## 2023-03-08 LAB — LACTIC ACID, PLASMA: Lactic Acid, Venous: 1.7 mmol/L (ref 0.5–1.9)

## 2023-03-08 LAB — I-STAT CHEM 8, ED
BUN: 70 mg/dL — ABNORMAL HIGH (ref 6–20)
Calcium, Ion: 0.95 mmol/L — ABNORMAL LOW (ref 1.15–1.40)
Chloride: 89 mmol/L — ABNORMAL LOW (ref 98–111)
Creatinine, Ser: 3.4 mg/dL — ABNORMAL HIGH (ref 0.61–1.24)
Glucose, Bld: >700 mg/dL (ref 70–99)
HCT: 46 % (ref 39.0–52.0)
Hemoglobin: 15.6 g/dL (ref 13.0–17.0)
Potassium: 7.5 mmol/L (ref 3.5–5.1)
Sodium: 119 mmol/L — CL (ref 135–145)
TCO2: 8 mmol/L — ABNORMAL LOW (ref 22–32)

## 2023-03-08 LAB — PROCALCITONIN: Procalcitonin: 15.16 ng/mL

## 2023-03-08 LAB — MAGNESIUM: Magnesium: 3.4 mg/dL — ABNORMAL HIGH (ref 1.7–2.4)

## 2023-03-08 LAB — MRSA NEXT GEN BY PCR, NASAL: MRSA by PCR Next Gen: NOT DETECTED

## 2023-03-08 LAB — BETA-HYDROXYBUTYRIC ACID: Beta-Hydroxybutyric Acid: >8 mmol/L — ABNORMAL HIGH (ref 0.05–0.27)

## 2023-03-08 MED ORDER — INSULIN REGULAR(HUMAN) IN NACL 100-0.9 UT/100ML-% IV SOLN
INTRAVENOUS | Status: AC
  Administered 2023-03-08: 6 [IU]/h via INTRAVENOUS
  Filled 2023-03-08 (×2): qty 100

## 2023-03-08 MED ORDER — HYDRALAZINE HCL 20 MG/ML IJ SOLN
10.0000 mg | Freq: Four times a day (QID) | INTRAMUSCULAR | Status: DC | PRN
  Administered 2023-03-09 – 2023-03-11 (×3): 10 mg via INTRAVENOUS
  Filled 2023-03-08 (×5): qty 1

## 2023-03-08 MED ORDER — ORAL CARE MOUTH RINSE: 15.0000 mL | OROMUCOSAL | Status: DC | PRN

## 2023-03-08 MED ORDER — ONDANSETRON HCL 4 MG/2ML IJ SOLN
4.0000 mg | Freq: Four times a day (QID) | INTRAMUSCULAR | Status: DC | PRN
  Administered 2023-03-08 – 2023-03-11 (×3): 4 mg via INTRAVENOUS
  Filled 2023-03-08 (×3): qty 2

## 2023-03-08 MED ORDER — LACTATED RINGERS IV BOLUS
1000.0000 mL | INTRAVENOUS | Status: DC
  Administered 2023-03-08: 1000 mL via INTRAVENOUS

## 2023-03-08 MED ORDER — POLYETHYLENE GLYCOL 3350 17 G PO PACK: 17.0000 g | PACK | Freq: Every day | ORAL | Status: DC | PRN

## 2023-03-08 MED ORDER — LACTATED RINGERS IV BOLUS
1000.0000 mL | Freq: Once | INTRAVENOUS | Status: AC
  Administered 2023-03-08: 1000 mL via INTRAVENOUS

## 2023-03-08 MED ORDER — INSULIN ASPART 100 UNIT/ML IJ SOLN
10.0000 [IU] | Freq: Once | INTRAMUSCULAR | Status: AC
  Administered 2023-03-08: 10 [IU] via SUBCUTANEOUS
  Filled 2023-03-08: qty 0.1

## 2023-03-08 MED ORDER — LACTATED RINGERS IV SOLN: INTRAVENOUS | Status: AC

## 2023-03-08 MED ORDER — DOCUSATE SODIUM 100 MG PO CAPS: 100.0000 mg | ORAL_CAPSULE | Freq: Two times a day (BID) | ORAL | Status: DC | PRN

## 2023-03-08 MED ORDER — DEXTROSE IN LACTATED RINGERS 5 % IV SOLN: INTRAVENOUS | Status: AC

## 2023-03-08 MED ORDER — HEPARIN SODIUM (PORCINE) 5000 UNIT/ML IJ SOLN
5000.0000 [IU] | Freq: Three times a day (TID) | INTRAMUSCULAR | Status: DC
  Administered 2023-03-08 – 2023-03-11 (×8): 5000 [IU] via SUBCUTANEOUS
  Filled 2023-03-08 (×8): qty 1

## 2023-03-08 MED ORDER — CALCIUM GLUCONATE-NACL 2-0.675 GM/100ML-% IV SOLN
2.0000 g | Freq: Once | INTRAVENOUS | Status: AC
  Administered 2023-03-08: 2000 mg via INTRAVENOUS
  Filled 2023-03-08: qty 100

## 2023-03-08 MED ORDER — CHLORHEXIDINE GLUCONATE CLOTH 2 % EX PADS
6.0000 | MEDICATED_PAD | Freq: Every day | CUTANEOUS | Status: DC
  Administered 2023-03-08 – 2023-03-10 (×3): 6 via TOPICAL

## 2023-03-08 MED ORDER — DILTIAZEM HCL 25 MG/5ML IV SOLN
10.0000 mg | Freq: Once | INTRAVENOUS | Status: AC
  Administered 2023-03-08: 10 mg via INTRAVENOUS
  Filled 2023-03-08: qty 5

## 2023-03-08 MED ORDER — LORAZEPAM 2 MG/ML IJ SOLN
1.0000 mg | Freq: Once | INTRAMUSCULAR | Status: AC | PRN
  Administered 2023-03-08: 1 mg via INTRAVENOUS
  Filled 2023-03-08: qty 1

## 2023-03-08 MED ORDER — DEXTROSE 50 % IV SOLN: 0.0000 mL | INTRAVENOUS | Status: DC | PRN

## 2023-03-08 NOTE — Progress Notes (Signed)
eLink Physician-Brief Progress Note Patient Name: Mason Mclaughlin DOB: Nov 12, 1995 MRN: [id number]   Date of Service  03/08/2023  HPI/Events of Note  Pt is dry heaving. Has cyclic vomitting syndrome. Asking for something IV PRN for dry heaving/nausea.  Also requesting something for anxiety. Seen somewhat restless  Qtc 419  eICU Interventions  Ordered zofran 46 prn IV Will give one time dose of Ativan IV     Intervention Category Intermediate Interventions: Other:  Darl Pikes 03/08/2023, 7:32 PM

## 2023-03-08 NOTE — ED Triage Notes (Signed)
Pt BIB EMS from Home due to Hyperglycemia. EMS reports BS CBG of 496. Pt states insulin pump came off yesterday wasn't able to get back on. Pt has labored breathing, lethargic, tachycardic at 120. Hx of Diabetes.  In Route: IV 20ga Left Hand NaCl 100 mL

## 2023-03-08 NOTE — ED Provider Notes (Cosign Needed Addendum)
[facility] EMERGENCY DEPARTMENT AT [facility] Provider Note   CSN: [id number] Arrival date & time: 03/08/23  1203     History  Chief Complaint  Patient presents with   Hyperglycemia    Mason Mclaughlin is a 27 y.o. male with past medical history significant for type 1 diabetes presents to the ED from home via EMS for hypoglycemia.  EMS report CBG of 496.  EMS reports that patient's insulin pump came off yesterday and he was not able to get back on.  Unsure if patient provided this information on scene as he is altered upon ED arrival.         Home Medications Prior to Admission medications   Medication Sig Start Date End Date Taking? Authorizing Provider  carvedilol (COREG) 12.5 MG tablet Take 1 tablet (12.5 mg total) by mouth 2 (two) times daily with a meal. 08/20/22   Gherghe, Daylene Katayama, MD  famotidine (PEPCID) 40 MG tablet Take 1 tablet (40 mg total) by mouth daily as needed for heartburn or indigestion. 08/20/22 08/20/23  Leatha Gilding, MD  Insulin Human (INSULIN PUMP) SOLN Inject 1 each into the skin continuous. Medication: Novolog    [provider]  lisinopril (ZESTRIL) 40 MG tablet Take 1 tablet (40 mg total) by mouth every morning. 08/20/22   Leatha Gilding, MD  ondansetron (ZOFRAN) 4 MG tablet Take 1 tablet (4 mg total) by mouth every 8 (eight) hours as needed for up to 20 doses for nausea or vomiting. 08/20/22   Leatha Gilding, MD  potassium chloride SA (KLOR-CON M) 20 MEQ tablet Take 1 tablet (20 mEq total) by mouth daily for 3 days. 08/20/22 08/23/22  Leatha Gilding, MD  promethazine (PHENERGAN) 25 MG tablet Take 1 tablet (25 mg total) by mouth every 6 (six) hours as needed for refractory nausea / vomiting. 08/20/22   Leatha Gilding, MD  zolpidem (AMBIEN) 10 MG tablet Take 1 tablet (10 mg total) by mouth at bedtime as needed for sleep. 08/20/22   Leatha Gilding, MD      Allergies    Patient has no known allergies.    Review of Systems    Review of Systems  Unable to perform ROS: Mental status change    Physical Exam Updated Vital Signs BP (!) 147/87   Pulse (!) 203   Temp (!) 96.7 F (35.9 C) (Axillary)   Resp (!) 26   SpO2 100%  Physical Exam Vitals and nursing note reviewed.  Constitutional:      General: He is not in acute distress.    Appearance: Normal appearance. He is ill-appearing. He is not toxic-appearing or diaphoretic.  HENT:     Mouth/Throat:     Lips: Pink.     Mouth: Mucous membranes are dry.  Eyes:     Pupils: Pupils are equal, round, and reactive to light.  Cardiovascular:     Rate and Rhythm: Regular rhythm. Tachycardia present.     Heart sounds: Normal heart sounds.  Pulmonary:     Effort: Pulmonary effort is normal. Tachypnea present. No accessory muscle usage or respiratory distress.     Breath sounds: Normal breath sounds and air entry.  Abdominal:     General: Abdomen is flat.     Palpations: Abdomen is soft.  Musculoskeletal:     Right lower leg: No edema.     Left lower leg: No edema.  Skin:    General: Skin is warm and dry.  Capillary Refill: Capillary refill takes less than 2 seconds.  Neurological:     Mental Status: He is alert. He is confused.     Comments: Patient is looking around room and responds to his name, but is unable to answer questions.       ED Results / Procedures / Treatments   Labs (all labs ordered are listed, but only abnormal results are displayed) Labs Reviewed  COMPREHENSIVE METABOLIC PANEL - Abnormal; Notable for the following components:      Result Value   Sodium 123 (*)    Potassium 6.6 (*)    Chloride 80 (*)    CO2 <7 (*)    Glucose, Bld >1,200 (*)    BUN 80 (*)    Creatinine, Ser 3.84 (*)    Calcium 7.7 (*)    Total Bilirubin 2.2 (*)    GFR, Estimated 21 (*)    All other components within normal limits  CBC WITH DIFFERENTIAL/PLATELET - Abnormal; Notable for the following components:   WBC 29.5 (*)    RBC 4.20 (*)    Hemoglobin  12.8 (*)    Neutro Abs 25.6 (*)    Monocytes Absolute 2.3 (*)    Abs Immature Granulocytes 0.63 (*)    All other components within normal limits  BETA-HYDROXYBUTYRIC ACID - Abnormal; Notable for the following components:   Beta-Hydroxybutyric Acid >8.00 (*)    All other components within normal limits  BLOOD GAS, VENOUS - Abnormal; Notable for the following components:   pH, Ven 7.09 (*)    pCO2, Ven <18 (*)    pO2, Ven 62 (*)    Bicarbonate 5.2 (*)    Acid-base deficit 22.7 (*)    All other components within normal limits  CBG MONITORING, ED - Abnormal; Notable for the following components:   Glucose-Capillary >600 (*)    All other components within normal limits  I-STAT CHEM 8, ED - Abnormal; Notable for the following components:   Sodium 119 (*)    Potassium 7.5 (*)    Chloride 89 (*)    BUN 70 (*)    Creatinine, Ser 3.40 (*)    Glucose, Bld >700 (*)    Calcium, Ion 0.95 (*)    TCO2 8 (*)    All other components within normal limits  CBG MONITORING, ED - Abnormal; Notable for the following components:   Glucose-Capillary >600 (*)    All other components within normal limits  CBG MONITORING, ED - Abnormal; Notable for the following components:   Glucose-Capillary >600 (*)    All other components within normal limits  CBG MONITORING, ED - Abnormal; Notable for the following components:   Glucose-Capillary >600 (*)    All other components within normal limits  AMMONIA  ETHANOL  URINALYSIS, ROUTINE W REFLEX MICROSCOPIC  RAPID URINE DRUG SCREEN, HOSP PERFORMED  I-STAT CG4 LACTIC ACID, ED  I-STAT CG4 LACTIC ACID, ED    EKG None  Radiology No results found.  Procedures .Critical Care  Performed by: Lenard Simmer, PA-C Authorized by: Lenard Simmer, PA-C   Critical care provider statement:    Critical care time (minutes):  52   Critical care time was exclusive of:  Separately billable procedures and treating other patients and teaching time   Critical care was  necessary to treat or prevent imminent or life-threatening deterioration of the following conditions:  Metabolic crisis   Critical care was time spent personally by me on the following activities:  Development of  treatment plan with patient or surrogate, discussions with consultants, evaluation of patient's response to treatment, examination of patient, ordering and review of laboratory studies, ordering and review of radiographic studies, ordering and performing treatments and interventions, pulse oximetry, re-evaluation of patient's condition, review of old charts and obtaining history from patient or surrogate   I assumed direction of critical care for this patient from another provider in my specialty: no     Care discussed with: admitting provider       Medications Ordered in ED Medications  insulin regular, human (MYXREDLIN) 100 units/ 100 mL infusion (6 Units/hr Intravenous Rate/Dose Verify 03/08/23 1556)  lactated ringers infusion ( Intravenous New Bag/Given 03/08/23 1331)  dextrose 5 % in lactated ringers infusion (has no administration in time range)  dextrose 50 % solution 0-50 mL (has no administration in time range)  lactated ringers bolus 1,000 mL (1,000 mLs Intravenous New Bag/Given 03/08/23 1331)  lactated ringers bolus 1,000 mL (0 mLs Intravenous Stopped 03/08/23 1447)  calcium gluconate 2 g/ 100 mL sodium chloride IVPB (0 mg Intravenous Stopped 03/08/23 1459)  insulin aspart (novoLOG) injection 10 Units (10 Units Subcutaneous Given 03/08/23 1517)  diltiazem (CARDIZEM) injection 10 mg (10 mg Intravenous Given 03/08/23 1545)    ED Course/ Medical Decision Making/ A&P                                 Medical Decision Making Amount and/or Complexity of Data Reviewed Labs: ordered. Radiology: ordered.  Risk Prescription drug management. Decision regarding hospitalization.   This patient presents to the ED with chief complaint(s) of hyperglycemia, AMS with pertinent past  medical history of type 1 diabetes.  The complaint involves an extensive differential diagnosis and also carries with it a high risk of complications and morbidity.    The differential diagnosis includes DKA, hyperglycemic crisis, metabolic encephalopathy    The initial plan is to obtain labs, CT head  Additional history obtained: Records reviewed previous admission documents - patient was admitted for DKA in August 2024  Initial Assessment:   Exam significant for ill-appearing patient who is tachypneic, altered, and tachycardic.  He is maintaining his airway.  Lungs clear to auscultation bilaterally.  Abdomen is soft, non distended.  No pedal edema.  PERRL.  Patient is looking around room and responds to his name, but is unable to answer questions.  He is moving all extremities.    Independent ECG/labs interpretation:  The following labs were independently interpreted:  CBC with marked leukocytosis and mild anemia.  Metabolic panel with glucose >1200.  K elevated at 6.6.  pH 7.09.  Lactic 3.6.  Beta-hydroxybutyric acid >8.    Patient's labs consistent with DKA.  Patient is afebrile and without infectious source.  Not concerned for sepsis at this time.  Independent visualization and interpretation of imaging: I independently visualized the following imaging with scope of interpretation limited to determining acute life threatening conditions related to emergency care: CT head, which revealed no acute abnormalities.  Treatment and Reassessment: Patient given LR fluid bolus x3.  Calcium gluconate given for hyperkalemia.  EndoTool initiated for treatment of DKA.   Informed by RN that's patient's HR in 200s.  12-lead confirmed.  10 mg diltiazem given with improvement in HR.  Upon reassessment, HR is 114 bpm.  Patient is resting comfortably, but remains tachypneic.   Consultations obtained:   I requested consultation with on-call critical care provider and spoke with  Posey Boyer, NP.   Critical care will come see patient.    Disposition:   Patient will be admitted by critical care to [facility] for DKA.           Final Clinical Impression(s) / ED Diagnoses Final diagnoses:  Diabetic ketoacidosis without coma associated with type 1 diabetes mellitus (HCC)  AKI (acute kidney injury) [facility])    Rx / DC Orders ED Discharge Orders     None         Lenard Simmer, PA-C 03/08/23 1746    Lenard Simmer, PA-C 03/08/23 1747    Melton Alar R, PA-C 03/08/23 1828    Anders Simmonds T, DO 03/09/23 [phone]

## 2023-03-08 NOTE — H&P (Cosign Needed Addendum)
NAME:  Mason Mclaughlin, MRN:  [id number], DOB:  [birth year], LOS: 0 ADMISSION DATE:  03/08/2023, CONSULTATION DATE:  03/08/23 REFERRING MD:  Melton Alar, PA, CHIEF COMPLAINT:  AMS   History of Present Illness:  HPI obtained from EMR as patient is encephalopathic.    40 yoM with PMH of T1DM, diabetic gastroparesis, HTN, and cyclic vomiting syndrome who presented from home by EMS with hyperglycemia, lethargy, tachycardia, and tachypnea.  Reported insulin pump came off yesterday and unable to get back on.  Recent admit at [facility] 8/9-8/11 for DKA.    In ER afebrile, normotensive, normoxia, with labs significant for glucose > 1200, bicarb < 7, AKI w/ sCr 3.84, K 6.6, t. Bili 2.2, WBC 29.5, beta-hydroxybutyric acid > 8, and PH 7.09.  Treated with 3L LR ordered, 1L given thus far, insulin gtt, and calcium gluconate 2gm.  Developed HR 200's with EKG showing Afib with RVR improved to ST with PAC's after diltiazem 10mg . CTH neg. He remains confused and protecting his airway.  PCCM called for admit.   - able to speak to mother by phone, reports was in normal state of health until Sunday, ran out of his insulin and was feeling bad.  Has been using insulin pump but no dexcom as trying to get on parents insurance.  Mother paid for insulin but not sure if he was using. No known N/V, fever, or other complaints.    Pertinent  Medical History  T1DM, diabetic gastroparesis, HTN, cyclic vomiting syndrome  Significant Hospital Events: Including procedures, antibiotic start and stop dates in addition to other pertinent events   10/15 admitted DKA, AKI  Interim History / Subjective:   Objective   Blood pressure (!) 147/87, pulse (!) 203, temperature 98.1 F (36.7 C), temperature source Oral, resp. rate (!) 26, height 5\' 6"  (1.676 m), weight 68.8 kg, SpO2 100%.    HR is currently 112, ST    No intake or output data in the 24 hours ending 03/08/23 1721 Filed Weights   03/08/23 1717  Weight: 68.8 kg     Examination: General:  ill appearing adult male sitting upright on ER stretcher, mildly restless in mittens HEENT: MM pink/dry, pupils 4/r, anicteric Neuro: Awake, confused, oriented to name, follows some simple commands, MAE, asking for water CV: rr ir, ST with PACs, no murmur PULM:  tachypneic but non labored, CTA, room air GI: soft, bs+, NT/ Ndno  Extremities: warm/dry, no LE edema  Skin: no rashes  Resolved Hospital Problem list    Assessment & Plan:   Type 1 DM with DKA, poorly controlled Acute metabolic encephalopathy  AGMA/ lactic acidosis  AKI in the setting of hypovolemia  Pseudohyperkalemia in the setting of DKA - corrected Na 141 P:  - Admit to ICU, - tele monitoring, monitor for EKG changes - currently protecting airway, continue to monitor - NPO w/ aspiration precautions till AG closed/ mental status improved - insulin gtt per endo tool - 1L LR given, 2L remaining to infused - repeat lactic after fluids  - LR at 189ml/ hr - trend BMETs q4 hrs - beta-hydroxybutyric acid q8 hrs  - pending UA and UDS - check A1c  (was 9 back in March 2024) - pending UA/ UDS - trend renal indices, consider renal ultrasound if worsening - strict I/Os, daily wts - avoid nephrotoxins, renal dose meds, hemodynamic support as above - consult to DM coordinator  - TOC consult for med/ insurance assistance   Leukocytosis - suspect reactive, no reported  infectious symptoms/ fevers, afebrile here.  No reports of vomiting, lungs clear - UA pending - check PCT - monitor clinically for now off abx   Tachycardia/ atrial arrhythmia - EKG read as afib w/ RVR, noted irregular with rate in 200's, possible was Afib vs ST with PACs Borderline prolonged QTc - likely stress/ demand, profound dehydration with multiple metabolic derangements.  Currently NSR with PAC's - cont tele monitoring, avoid Qtc prolonging meds - check Mag, optimize electrolytes.   - correct metabolic derangements  as above    HTN - normotensive currently - hold home coreg, lisinopril for now while NPO and w/ AKI - prn hydralazine    Elevated t.bili - hx of intermittently elevated with DKA.  Abd exam benign.  Repeat in am  Best Practice (right click and "Reselect all SmartList Selections" daily)   Diet/type: NPO DVT prophylaxis: prophylactic heparin  GI prophylaxis: N/A Lines: N/A Foley:  N/A Code Status:  full code Last date of multidisciplinary goals of care discussion [10/15]  Mother, Benjie Ricketson updated by phone [phone]  Labs   CBC: Recent Labs  Lab 03/08/23 1306 03/08/23 1318  WBC  --  29.5*  NEUTROABS  --  25.6*  HGB 15.6 12.8*  HCT 46.0 39.3  MCV  --  93.6  PLT  --  290    Basic Metabolic Panel: Recent Labs  Lab 03/08/23 1306 03/08/23 1318  NA 119* 123*  K 7.5* 6.6*  CL 89* 80*  CO2  --  <7*  GLUCOSE >700* >1,200*  BUN 70* 80*  CREATININE 3.40* 3.84*  CALCIUM  --  7.7*   GFR: Estimated Creatinine Clearance: 26.3 mL/min (A) (by C-G formula based on SCr of 3.84 mg/dL (H)). Recent Labs  Lab 03/08/23 1318 03/08/23 1554  WBC 29.5*  --   LATICACIDVEN  --  3.6*    Liver Function Tests: Recent Labs  Lab 03/08/23 1318  AST 33  ALT 34  ALKPHOS 100  BILITOT 2.2*  PROT 7.0  ALBUMIN 3.6   No results for input(s): "LIPASE", "AMYLASE" in the last 168 hours. Recent Labs  Lab 03/08/23 1318  AMMONIA 30    ABG    Component Value Date/Time   PHART 7.42 08/18/2022 0113   PCO2ART 23 (L) 08/18/2022 0113   PO2ART 120 (H) 08/18/2022 0113   HCO3 5.2 (L) 03/08/2023 1338   TCO2 8 (L) 03/08/2023 1306   ACIDBASEDEF 22.7 (H) 03/08/2023 1338   O2SAT 89.5 03/08/2023 1338     Coagulation Profile: No results for input(s): "INR", "PROTIME" in the last 168 hours.  Cardiac Enzymes: No results for input(s): "CKTOTAL", "CKMB", "CKMBINDEX", "TROPONINI" in the last 168 hours.  HbA1C: Hgb A1c MFr Bld  Date/Time Value Ref Range Status  08/18/2022 03:49  PM 9.0 (H) 4.8 - 5.6 % Final    Comment:    (NOTE)         Prediabetes: 5.7 - 6.4         Diabetes: >6.4         Glycemic control for adults with diabetes: <7.0   01/17/2022 05:33 AM 9.4 (H) 4.8 - 5.6 % Final    Comment:    (NOTE) Pre diabetes:          5.7%-6.4%  Diabetes:              >6.4%  Glycemic control for   <7.0% adults with diabetes     CBG: Recent Labs  Lab 03/08/23 1213 03/08/23 1404 03/08/23 1444  03/08/23 1552 03/08/23 1628  GLUCAP >600* >600* >600* >600* >600*    Review of Systems:   unable  Past Medical History:  He,  has a past medical history of Diabetes (HCC), DKA (diabetic ketoacidoses), DKA, type 1 (HCC), and Insulin pump in place.   Surgical History:  History reviewed. No pertinent surgical history.   Social History:   reports that he has never smoked. He has never used smokeless tobacco. He reports current alcohol use. He reports current drug use. Frequency: 4.00 times per week. Drug: Marijuana.   Family History:  His family history includes Diabetes in his father; Healthy in his mother.   Allergies No Known Allergies   Home Medications  Prior to Admission medications   Medication Sig Start Date End Date Taking? Authorizing Provider  carvedilol (COREG) 12.5 MG tablet Take 1 tablet (12.5 mg total) by mouth 2 (two) times daily with a meal. 08/20/22   Gherghe, Daylene Katayama, MD  famotidine (PEPCID) 40 MG tablet Take 1 tablet (40 mg total) by mouth daily as needed for heartburn or indigestion. 08/20/22 08/20/23  Leatha Gilding, MD  Insulin Human (INSULIN PUMP) SOLN Inject 1 each into the skin continuous. Medication: Novolog    [provider]  lisinopril (ZESTRIL) 40 MG tablet Take 1 tablet (40 mg total) by mouth every morning. 08/20/22   Leatha Gilding, MD  ondansetron (ZOFRAN) 4 MG tablet Take 1 tablet (4 mg total) by mouth every 8 (eight) hours as needed for up to 20 doses for nausea or vomiting. 08/20/22   Leatha Gilding, MD   potassium chloride SA (KLOR-CON M) 20 MEQ tablet Take 1 tablet (20 mEq total) by mouth daily for 3 days. 08/20/22 08/23/22  Leatha Gilding, MD  promethazine (PHENERGAN) 25 MG tablet Take 1 tablet (25 mg total) by mouth every 6 (six) hours as needed for refractory nausea / vomiting. 08/20/22   Leatha Gilding, MD  zolpidem (AMBIEN) 10 MG tablet Take 1 tablet (10 mg total) by mouth at bedtime as needed for sleep. 08/20/22   Leatha Gilding, MD     Critical care time: 48 mins       Posey Boyer, MSN, AG-ACNP-BC [facility] 03/08/2023, 5:21 PM  See Amion for pager If no response to pager , please call [phone] until 7pm After 7:00 pm call Elink  [phone]

## 2023-03-08 NOTE — Progress Notes (Addendum)
Post fall summary: see also Dr. Yvetta Coder note.    03/08/23 2149  What Happened  Was fall witnessed? No (occured at 2120)  Was patient injured? No  Patient found on floor (at the end of the bed on the side sitting up)  Found by Staff-comment (bed alarm sounded, primary nurse ran immediately and Clinical research associate followed as well as NT)  Stated prior activity  (patient sat up and slid off the right side end of the bed between the side rail and footboard.)  Provider Notification  Provider Name/Title Dr. Su Hoff / Sheria Lang RN [facility]  Date Provider Notified 03/08/23  Time Provider Notified 2122  Method of Notification Face-to-face (Hit button for camera to connect to [facility] and then was "face to face" via camera)  Notification Reason Fall  Provider response See new orders  Date of Provider Response 03/08/23  Time of Provider Response 2123  Follow Up  Family notified Yes - comment Misty Stanley (primary RN) finally talking to grandmother now at 2225 (had to leave message and now being returned))  Time family notified 2225  Additional tests Yes-comment (ABG)  Simple treatment Other (comment) (n/a)  Progress note created (see row info) Yes  Blank note created Yes  Adult Fall Risk Assessment  Risk Factor Category (scoring not indicated) High fall risk per protocol (document High fall risk)  Patient Fall Risk Level High fall risk  Adult Fall Risk Interventions  Required Bundle Interventions *See Row Information* High fall risk - low, moderate, and high requirements implemented  Additional Interventions Use of appropriate toileting equipment (bedpan, BSC, etc.);Reorient/diversional activities with confused patients  Fall intervention(s) refused/Patient educated regarding refusal Nonskid socks  Screening for Fall Injury Risk (To be completed on HIGH fall risk patients) - Assessing Need for Floor Mats  Risk For Fall Injury- Criteria for Floor Mats Noncompliant with safety precautions  Vitals  Temp 98.6  F (37 C)  Temp Source Axillary  Pulse Rate (!) 118  ECG Heart Rate (!) 117  Resp (!) 26  Oxygen Therapy  SpO2 100 %  O2 Device Room Air  Pain Assessment  Pain Scale CPOT  Critical Care Pain Observation Tool (CPOT)  Facial Expression 0  Body Movements 0  Muscle Tension 0  Compliance with ventilator (intubated pts.) N/A  Vocalization (extubated pts.) 0  CPOT Total 0  PCA/Epidural/Spinal Assessment  Respiratory Pattern Regular;Unlabored;Symmetrical  Neurological  Neuro (WDL) X  Level of Consciousness Responds to Pain  Orientation Level Other (comment) (unable to assess, patient not answering questions)  Cognition Poor safety awareness;Impulsive;Unable to follow commands;Poor attention/concentration;Poor judgement  Speech Other (Comment) (patient not verbalizing)  Neuro Symptoms Other (Comment) (obtunded)  Neuro symptoms relieved by Other (Comment) (nothing yet)  Glasgow Coma Scale  Eye Opening 4  Best Verbal Response (NON-intubated) 1  Best Motor Response 4  Glasgow Coma Scale Score 9  Musculoskeletal  Musculoskeletal (WDL) X  Assistive Device None  Generalized Weakness Yes  Weight Bearing Restrictions No  Integumentary  Integumentary (WDL) WDL  RN Assisting with Skin Assessment on Admission Gwendolyn Fill, RN and Annamarie Dawley, RN

## 2023-03-08 NOTE — Progress Notes (Addendum)
eLink Physician-Brief Progress Note Patient Name: Mason Mclaughlin DOB: 1996-01-23 MRN: [id number]   Date of Service  03/08/2023  HPI/Events of Note  Received request for renewal of restraints Patient seen on camera and is at risk for self harm by pulling lines and tubes and falls.  ABG reviewed with acidosis improved  eICU Interventions  Soft waist belt ordered as requested Bedside team to assess in am if restraints to be continued     Intervention Category Intermediate Interventions: Other:  Darl Pikes 03/08/2023, 10:36 PM

## 2023-03-08 NOTE — ED Notes (Signed)
Endo tool checked, continue current rate for 30 mins.

## 2023-03-08 NOTE — Progress Notes (Signed)
eLink Physician-Brief Progress Note Patient Name: Mason Mclaughlin DOB: 02-24-1996 MRN: [id number]   Date of Service  03/08/2023  HPI/Events of Note  Patient is agitated. Got out of bed to urinate and had a fall. No injuries, landed on fall mats. Confused, AMSx4. Not answering questions. Seems dazed.  Can we get an ABG? bilateral wrist restraints? Also foley please   Head CT done on admission negative Most recent CBG 324 trending down  eICU Interventions  ABG and foley ordered. Repeat BMET pending Bilateral wrist restraints ordered. Bedside rounding team to reassess when restraints can be safely removed Will avoid further sedating medications Discussed with bedside RN     Intervention Category Major Interventions: Delirium, psychosis, severe agitation - evaluation and management;Other:  Darl Pikes 03/08/2023, 9:22 PM

## 2023-03-09 DIAGNOSIS — E1010 Type 1 diabetes mellitus with ketoacidosis without coma: Secondary | ICD-10-CM

## 2023-03-09 LAB — BETA-HYDROXYBUTYRIC ACID
Beta-Hydroxybutyric Acid: 7.17 mmol/L — ABNORMAL HIGH (ref 0.05–0.27)
Beta-Hydroxybutyric Acid: 4.48 mmol/L — ABNORMAL HIGH (ref 0.05–0.27)
Beta-Hydroxybutyric Acid: 1.48 mmol/L — ABNORMAL HIGH (ref 0.05–0.27)
Beta-Hydroxybutyric Acid: 1.44 mmol/L — ABNORMAL HIGH (ref 0.05–0.27)

## 2023-03-09 LAB — GLUCOSE, CAPILLARY
Glucose-Capillary: 158 mg/dL — ABNORMAL HIGH (ref 70–99)
Glucose-Capillary: 165 mg/dL — ABNORMAL HIGH (ref 70–99)
Glucose-Capillary: 166 mg/dL — ABNORMAL HIGH (ref 70–99)
Glucose-Capillary: 171 mg/dL — ABNORMAL HIGH (ref 70–99)
Glucose-Capillary: 172 mg/dL — ABNORMAL HIGH (ref 70–99)
Glucose-Capillary: 177 mg/dL — ABNORMAL HIGH (ref 70–99)
Glucose-Capillary: 178 mg/dL — ABNORMAL HIGH (ref 70–99)
Glucose-Capillary: 182 mg/dL — ABNORMAL HIGH (ref 70–99)
Glucose-Capillary: 182 mg/dL — ABNORMAL HIGH (ref 70–99)
Glucose-Capillary: 183 mg/dL — ABNORMAL HIGH (ref 70–99)
Glucose-Capillary: 190 mg/dL — ABNORMAL HIGH (ref 70–99)
Glucose-Capillary: 191 mg/dL — ABNORMAL HIGH (ref 70–99)
Glucose-Capillary: 192 mg/dL — ABNORMAL HIGH (ref 70–99)
Glucose-Capillary: 195 mg/dL — ABNORMAL HIGH (ref 70–99)
Glucose-Capillary: 201 mg/dL — ABNORMAL HIGH (ref 70–99)
Glucose-Capillary: 218 mg/dL — ABNORMAL HIGH (ref 70–99)
Glucose-Capillary: 224 mg/dL — ABNORMAL HIGH (ref 70–99)
Glucose-Capillary: 225 mg/dL — ABNORMAL HIGH (ref 70–99)
Glucose-Capillary: 241 mg/dL — ABNORMAL HIGH (ref 70–99)
Glucose-Capillary: 249 mg/dL — ABNORMAL HIGH (ref 70–99)
Glucose-Capillary: 258 mg/dL — ABNORMAL HIGH (ref 70–99)
Glucose-Capillary: 335 mg/dL — ABNORMAL HIGH (ref 70–99)

## 2023-03-09 LAB — HEPATIC FUNCTION PANEL
ALT: 35 U/L (ref 0–44)
AST: 64 U/L — ABNORMAL HIGH (ref 15–41)
Albumin: 3.6 g/dL (ref 3.5–5.0)
Alkaline Phosphatase: 85 U/L (ref 38–126)
Bilirubin, Direct: 0.2 mg/dL (ref 0.0–0.2)
Indirect Bilirubin: 1.2 mg/dL — ABNORMAL HIGH (ref 0.3–0.9)
Total Bilirubin: 1.4 mg/dL — ABNORMAL HIGH (ref 0.3–1.2)
Total Protein: 7.3 g/dL (ref 6.5–8.1)

## 2023-03-09 LAB — BASIC METABOLIC PANEL
Anion gap: 21 — ABNORMAL HIGH (ref 5–15)
Anion gap: 19 — ABNORMAL HIGH (ref 5–15)
Anion gap: 17 — ABNORMAL HIGH (ref 5–15)
Anion gap: 14 (ref 5–15)
Anion gap: 12 (ref 5–15)
Anion gap: 10 (ref 5–15)
BUN: 53 mg/dL — ABNORMAL HIGH (ref 6–20)
BUN: 40 mg/dL — ABNORMAL HIGH (ref 6–20)
BUN: 37 mg/dL — ABNORMAL HIGH (ref 6–20)
BUN: 29 mg/dL — ABNORMAL HIGH (ref 6–20)
BUN: 22 mg/dL — ABNORMAL HIGH (ref 6–20)
BUN: 15 mg/dL (ref 6–20)
CO2: 19 mmol/L — ABNORMAL LOW (ref 22–32)
CO2: 22 mmol/L (ref 22–32)
CO2: 22 mmol/L (ref 22–32)
CO2: 26 mmol/L (ref 22–32)
CO2: 26 mmol/L (ref 22–32)
CO2: 28 mmol/L (ref 22–32)
Calcium: 8.9 mg/dL (ref 8.9–10.3)
Calcium: 9.1 mg/dL (ref 8.9–10.3)
Calcium: 8.6 mg/dL — ABNORMAL LOW (ref 8.9–10.3)
Calcium: 8.5 mg/dL — ABNORMAL LOW (ref 8.9–10.3)
Calcium: 8.5 mg/dL — ABNORMAL LOW (ref 8.9–10.3)
Calcium: 8.3 mg/dL — ABNORMAL LOW (ref 8.9–10.3)
Chloride: 106 mmol/L (ref 98–111)
Chloride: 106 mmol/L (ref 98–111)
Chloride: 108 mmol/L (ref 98–111)
Chloride: 107 mmol/L (ref 98–111)
Chloride: 106 mmol/L (ref 98–111)
Chloride: 101 mmol/L (ref 98–111)
Creatinine, Ser: 2.2 mg/dL — ABNORMAL HIGH (ref 0.61–1.24)
Creatinine, Ser: 1.62 mg/dL — ABNORMAL HIGH (ref 0.61–1.24)
Creatinine, Ser: 1.64 mg/dL — ABNORMAL HIGH (ref 0.61–1.24)
Creatinine, Ser: 1.41 mg/dL — ABNORMAL HIGH (ref 0.61–1.24)
Creatinine, Ser: 1.15 mg/dL (ref 0.61–1.24)
Creatinine, Ser: 1.06 mg/dL (ref 0.61–1.24)
GFR, Estimated: 41 mL/min — ABNORMAL LOW (ref >60)
GFR, Estimated: 60 mL/min — ABNORMAL LOW (ref >60)
GFR, Estimated: 59 mL/min — ABNORMAL LOW (ref >60)
GFR, Estimated: >60 mL/min (ref >60)
GFR, Estimated: >60 mL/min (ref >60)
GFR, Estimated: >60 mL/min (ref >60)
Glucose, Bld: 183 mg/dL — ABNORMAL HIGH (ref 70–99)
Glucose, Bld: 198 mg/dL — ABNORMAL HIGH (ref 70–99)
Glucose, Bld: 300 mg/dL — ABNORMAL HIGH (ref 70–99)
Glucose, Bld: 166 mg/dL — ABNORMAL HIGH (ref 70–99)
Glucose, Bld: 173 mg/dL — ABNORMAL HIGH (ref 70–99)
Glucose, Bld: 143 mg/dL — ABNORMAL HIGH (ref 70–99)
Potassium: 4.1 mmol/L (ref 3.5–5.1)
Potassium: 3.8 mmol/L (ref 3.5–5.1)
Potassium: 3.5 mmol/L (ref 3.5–5.1)
Potassium: 3.3 mmol/L — ABNORMAL LOW (ref 3.5–5.1)
Potassium: 3 mmol/L — ABNORMAL LOW (ref 3.5–5.1)
Potassium: 2.7 mmol/L — CL (ref 3.5–5.1)
Sodium: 146 mmol/L — ABNORMAL HIGH (ref 135–145)
Sodium: 147 mmol/L — ABNORMAL HIGH (ref 135–145)
Sodium: 147 mmol/L — ABNORMAL HIGH (ref 135–145)
Sodium: 147 mmol/L — ABNORMAL HIGH (ref 135–145)
Sodium: 144 mmol/L (ref 135–145)
Sodium: 139 mmol/L (ref 135–145)

## 2023-03-09 LAB — CBC
HCT: 40.4 % (ref 39.0–52.0)
Hemoglobin: 14.3 g/dL (ref 13.0–17.0)
MCH: 30.6 pg (ref 26.0–34.0)
MCHC: 35.4 g/dL (ref 30.0–36.0)
MCV: 86.3 fL (ref 80.0–100.0)
Platelets: 280 10*3/uL (ref 150–400)
RBC: 4.68 MIL/uL (ref 4.22–5.81)
RDW: 12.5 % (ref 11.5–15.5)
WBC: 24.4 10*3/uL — ABNORMAL HIGH (ref 4.0–10.5)
nRBC: 0 % (ref 0.0–0.2)

## 2023-03-09 LAB — HIV ANTIBODY (ROUTINE TESTING W REFLEX): HIV Screen 4th Generation wRfx: NONREACTIVE

## 2023-03-09 LAB — PHOSPHORUS: Phosphorus: 2.2 mg/dL — ABNORMAL LOW (ref 2.5–4.6)

## 2023-03-09 LAB — MAGNESIUM
Magnesium: 2.6 mg/dL — ABNORMAL HIGH (ref 1.7–2.4)
Magnesium: 2.1 mg/dL (ref 1.7–2.4)

## 2023-03-09 MED ORDER — HALOPERIDOL LACTATE 5 MG/ML IJ SOLN
5.0000 mg | Freq: Four times a day (QID) | INTRAMUSCULAR | Status: DC | PRN
  Administered 2023-03-09: 5 mg via INTRAVENOUS
  Filled 2023-03-09: qty 1

## 2023-03-09 MED ORDER — SODIUM CHLORIDE 0.9 % IV SOLN
2.0000 g | INTRAVENOUS | Status: DC
  Administered 2023-03-09 – 2023-03-11 (×3): 2 g via INTRAVENOUS
  Filled 2023-03-09 (×3): qty 20

## 2023-03-09 MED ORDER — POTASSIUM CHLORIDE 10 MEQ/100ML IV SOLN
10.0000 meq | INTRAVENOUS | Status: AC
  Administered 2023-03-09 – 2023-03-10 (×6): 10 meq via INTRAVENOUS
  Filled 2023-03-09 (×6): qty 100

## 2023-03-09 MED ORDER — ORAL CARE MOUTH RINSE: 15.0000 mL | OROMUCOSAL | Status: DC | PRN

## 2023-03-09 MED ORDER — DEXTROSE IN LACTATED RINGERS 5 % IV SOLN: INTRAVENOUS | Status: AC

## 2023-03-09 MED ORDER — POTASSIUM CHLORIDE 10 MEQ/100ML IV SOLN
10.0000 meq | INTRAVENOUS | Status: AC
  Administered 2023-03-09 (×4): 10 meq via INTRAVENOUS
  Filled 2023-03-09 (×4): qty 100

## 2023-03-09 MED ORDER — POTASSIUM PHOSPHATES 15 MMOLE/5ML IV SOLN
15.0000 mmol | Freq: Once | INTRAVENOUS | Status: AC
  Administered 2023-03-09: 15 mmol via INTRAVENOUS
  Filled 2023-03-09: qty 5

## 2023-03-09 MED ORDER — ORAL CARE MOUTH RINSE: 15.0000 mL | OROMUCOSAL | Status: DC

## 2023-03-09 MED ORDER — LABETALOL HCL 5 MG/ML IV SOLN
10.0000 mg | INTRAVENOUS | Status: DC | PRN
  Administered 2023-03-09 – 2023-03-11 (×9): 10 mg via INTRAVENOUS
  Filled 2023-03-09 (×7): qty 4

## 2023-03-09 MED ORDER — LACTATED RINGERS IV BOLUS
1000.0000 mL | Freq: Once | INTRAVENOUS | Status: AC
  Administered 2023-03-09: 1000 mL via INTRAVENOUS

## 2023-03-09 NOTE — Progress Notes (Signed)
Pharmacy Electrolyte Replacement  Recent Labs:  Recent Labs    03/09/23 0822 03/09/23 1132  K 3.5 3.3*  MG 2.6*  --   PHOS 2.2*  --   CREATININE 1.64* 1.41*     MD Contacted: pt previously discussed w/ Dr. Isaiah Serge  Plan:  -KCl 10 mEq IV x 4 -K phos 15 mmol IV x 1 -Please notify pharmacy or MD if further replacement needed with future labs  Pricilla Riffle, PharmD, BCPS Clinical Pharmacist 03/09/2023 3:32 PM

## 2023-03-09 NOTE — Progress Notes (Signed)
NAME:  Mason Mclaughlin, MRN:  [id number], DOB:  06-Nov-1995, LOS: 1 ADMISSION DATE:  03/08/2023, CONSULTATION DATE:  03/08/23 REFERRING MD:  Melton Alar, PA, CHIEF COMPLAINT:  AMS   History of Present Illness:  HPI obtained from EMR as patient is encephalopathic.    85 yoM with PMH of T1DM, diabetic gastroparesis, HTN, and cyclic vomiting syndrome who presented from home by EMS with hyperglycemia, lethargy, tachycardia, and tachypnea.  Reported insulin pump came off yesterday and unable to get back on.  Recent admit at [facility] 8/9-8/11 for DKA.    In ER afebrile, normotensive, normoxia, with labs significant for glucose > 1200, bicarb < 7, AKI w/ sCr 3.84, K 6.6, t. Bili 2.2, WBC 29.5, beta-hydroxybutyric acid > 8, and PH 7.09.  Treated with 3L LR ordered, 1L given thus far, insulin gtt, and calcium gluconate 2gm.  Developed HR 200's with EKG showing Afib with RVR improved to ST with PAC's after diltiazem 10mg . CTH neg. He remains confused and protecting his airway.  PCCM called for admit.   - able to speak to mother by phone, reports was in normal state of health until Sunday, ran out of his insulin and was feeling bad.  Has been using insulin pump but no dexcom as trying to get on parents insurance.  Mother paid for insulin but not sure if he was using. No known N/V, fever, or other complaints.    Pertinent  Medical History  T1DM, diabetic gastroparesis, HTN, cyclic vomiting syndrome  Significant Hospital Events: Including procedures, antibiotic start and stop dates in addition to other pertinent events   10/15 admitted DKA, AKI 10/16 Remains on insulin drip with open gap and elevated beta hydrox, hypertensive and mildly tachycardic. Agitated overnight with fall after trying to ambulate to bathroom   Interim History / Subjective:  Resting in bed, unable/unwilling to follow commands   Objective   Blood pressure (!) 192/115, pulse (!) 112, temperature 98.9 F (37.2 C), temperature source  Axillary, resp. rate 17, height 5\' 6"  (1.676 m), weight 68.8 kg, SpO2 100%.    HR is currently 112, ST     Intake/Output Summary (Last 24 hours) at 03/09/2023 0725 Last data filed at 03/09/2023 0600 Gross per 24 hour  Intake 3864.76 ml  Output 3600 ml  Net 264.76 ml   Filed Weights   03/08/23 1717 03/09/23 0500  Weight: 68.8 kg 68.8 kg    Examination: General: Acute ill appearing adult male lying in bed, in NAD HEENT: [place]/AT, MM pink/moist, PERRL,  Neuro: Alert ad oriented x3, non-focal  CV: s1s2 regular rate and rhythm, no murmur, rubs, or gallops,  PULM:  Clear to auscultation, no increased work of breathing, no added breath sounds, on RA GI: soft, bowel sounds active in all 4 quadrants, non-tender, non-distended Extremities: warm/dry, no edema  Skin: no rashes or lesions  Resolved Hospital Problem list     Assessment & Plan:   Type 1 DM with DKA, poorly controlled AGMA/ lactic acidosis  -Hemoglobin A1c 9.0 March 2024 P: Ongoing IV hydration  Insulin drip  Closely monitor patient's electrolytes  Maintain potassium greater than 4 Accu-Cheks every 1 hour Once blood glucose falls below 250 start patient on D5 IV fluids NPO with aspiration precautions  When I anion gap closes and patient is able to tolerate oral diet transition to subcu long-acting insulin in slowly turned drip off over the next 1-2 hours Monitor renal function NPO/noncaloric clears until gap closed Resume home insulin regiment once appropriate  TOC consult for medication assistance  Acute metabolic encephalopathy  P: Management per neurology  Maintain neuro protective measures; goal for eurothermia, euglycemia, eunatermia, normoxia, and PCO2 goal of 35-40 Nutrition and bowel regiment  Seizure precautions  AEDs per neurology  Aspirations precautions   Leukocytosis  -Suspect reactive, no reported infectious symptoms/ fevers, afebrile here.  No reports of vomiting, lungs clear. However PCT is  elevated at 15.16 and WBC remains elevated at 24.4 despite IV hydration  P: Start empiric Ceftriaxone  Trend CBC and fever curve Consider ABD imagine to further assess for infection source   Tachycardia/ atrial arrhythmia -EKG read as afib w/ RVR, noted irregular with rate in 200's, possible was Afib vs ST with PACs -Likely stress/ demand, profound dehydration with multiple metabolic derangements.  Currently NSR with PAC's Borderline prolonged QTc HTN P: Continuous telemetry  Optimize electrolytes  Correct metabolic derangements  Resume home medications when appropriate  PRN IV antihypertensive's  Additional fluid bolus now   AKI in the setting of hypovolemia - improved  P: Follow renal function  Monitor urine output Trend Bmet Avoid nephrotoxins Ensure adequate renal perfusion  IV hydration  Pseudohyperkalemia in the setting of DKA P: IV hydration  Elevated t.bili - Improving  - hx of intermittently elevated with DKA.  Abd exam benign.  P: Trend   Best Practice (right click and "Reselect all SmartList Selections" daily)   Diet/type: NPO DVT prophylaxis: prophylactic heparin  GI prophylaxis: N/A Lines: N/A Foley:  N/A Code Status:  full code Last date of multidisciplinary goals of care discussion [10/15]  Mother, Jaylon Boylen updated by phone [phone] Critical care time: 48 mins   CRITICAL CARE Performed by: Amora Sheehy D. Harris  Total critical care time: 38 minutes  Critical care time was exclusive of separately billable procedures and treating other patients.  Critical care was necessary to treat or prevent imminent or life-threatening deterioration.  Critical care was time spent personally by me on the following activities: development of treatment plan with patient and/or surrogate as well as nursing, discussions with consultants, evaluation of patient's response to treatment, examination of patient, obtaining history from patient or surrogate,  ordering and performing treatments and interventions, ordering and review of laboratory studies, ordering and review of radiographic studies, pulse oximetry and re-evaluation of patient's condition.  Crystel Demarco D. Harris, NP-C [facility] Personal contact information can be found on Amion  If no contact or response made please call [phone] 03/09/2023, 7:42 AM

## 2023-03-09 NOTE — Plan of Care (Signed)
Pt require insertion of foley catheter - unable to urinate despite 417 on bladder scan - 450 of urine returned upon foley insertion.  Small urethra requiring 14F foley;  pt remains AMS, only able to shake his head no to pain question, and able to finally state first and last name.    Problem: Metabolic: Goal: Ability to maintain appropriate glucose levels will improve Outcome: Progressing   Problem: Respiratory: Goal: Will regain and/or maintain adequate ventilation Outcome: Progressing   Problem: Urinary Elimination: Goal: Ability to achieve and maintain adequate renal perfusion and functioning will improve Outcome: Progressing   Problem: Fluid Volume: Goal: Ability to maintain a balanced intake and output will improve Outcome: Progressing   Problem: Metabolic: Goal: Ability to maintain appropriate glucose levels will improve Outcome: Progressing   Problem: Skin Integrity: Goal: Risk for impaired skin integrity will decrease Outcome: Progressing   Problem: Tissue Perfusion: Goal: Adequacy of tissue perfusion will improve Outcome: Progressing   Problem: Safety: Goal: Ability to remain free from injury will improve Outcome: Progressing   Problem: Safety: Goal: Non-violent Restraint(s) Outcome: Progressing

## 2023-03-09 NOTE — Progress Notes (Addendum)
eLink Physician-Brief Progress Note Patient Name: Mason Mclaughlin DOB: [birth year] MRN: [id number]   Date of Service  03/09/2023  HPI/Events of Note  Patient now more alert but trying to get out of bed and appears confused  eICU Interventions  Ordered Haldol 5 mg IV prn Discussed with bedside RN     Intervention Category Intermediate Interventions: Other: Minor Interventions: Agitation / anxiety - evaluation and management  Darl Pikes 03/09/2023, 3:16 AM

## 2023-03-09 NOTE — Progress Notes (Signed)
Insulin gtt off at 2147 per CCM doctor Marlane Mingle due to a potassium of 2.7. CCM doctor Marlane Mingle stated to turn the insulin gtt back on once potassium replenishes to 3. In addition, CCM doctor Marlane Mingle advised this RN to hold his D5LR for two hours, the fluid was turned off at 2155. Both the insulin drip and D5LR remains off at this time.

## 2023-03-09 NOTE — Inpatient Diabetes Management (Signed)
Inpatient Diabetes Program Recommendations  AACE/ADA: New Consensus Statement on Inpatient Glycemic Control (2015)  Target Ranges:  Prepandial:   less than 140 mg/dL      Peak postprandial:   less than 180 mg/dL (1-2 hours)      Critically ill patients:  140 - 180 mg/dL   Lab Results  Component Value Date   GLUCAP 218 (H) 03/09/2023   HGBA1C 9.0 (H) 08/18/2022    Review of Glycemic Control  Diabetes history: DM1 Outpatient Diabetes medications: Insulin pump Current orders for Inpatient glycemic control: IV insulin per [employer] for DKA  Hgba1C - 9.0% BHB still elevated at 4.48  Pump settings:  Basal rate 12a 1.3 u/hr, correct 1:30, 1:5 carbs  630a 1.5u/hr  12p 1.4 u/hr  11pm 1.15 u/hr   Pcp/endo in [place]  See in am when appropriate.  Follow. RV    Inpatient Diabetes Program Recommendations:    Continue with IV insulin drip  Reports insulin pump came off on 10/14 and unable to get back on.   Spoke with pt's mother on phone and will meet in pt's room on 10/17 between 1100-12 noon to discuss his diabetes control.  Follow.  Thank you. Ailene Ards, RD, LDN, CDCES Inpatient Diabetes Coordinator [phone]

## 2023-03-09 NOTE — Progress Notes (Signed)
CBG at 2311 was 249. This RN notified Elink RN, Melvern Banker at [phone] via secure chat of CBG to make CCM doctor aware. Insulin gtt and D5LR remains off at this time.

## 2023-03-09 NOTE — Progress Notes (Signed)
eLink Physician-Brief Progress Note Patient Name: Mason Mclaughlin DOB: [birth year] MRN: [id number]   Date of Service  03/09/2023  HPI/Events of Note  BP 192/90. Not due for prn hydralazine yet. Also HR 120's ST, went up to 170's with agitation. EKG done sinus tachycardia  eICU Interventions  Ordered labetalol prn      Intervention Category Intermediate Interventions: Hypertension - evaluation and management  Rosalie Gums Janoah Menna 03/09/2023, 1:45 AM

## 2023-03-09 NOTE — Progress Notes (Addendum)
eLink Physician-Brief Progress Note Patient Name: Mason Mclaughlin DOB: [birth year] MRN: [id number]   Date of Service  03/09/2023  HPI/Events of Note  potassium 2.7, cret 1.06, GFR >60. Pt only has PIVs, is on CLIQ diet. Also lab was drawn while potassium phos IV was running, per nurse still has about 100cc left to infuse.  Discussed with RN. CBG last 158. On 2.2 units.  eICU Interventions  Hold insuline until potassium over 3. On kphos mmols running. Kcl IV ordered. Re check Potassium/BMP midnihgt Stat Mag, keep over 2.      Intervention Category Intermediate Interventions: Electrolyte abnormality - evaluation and management  Ranee Gosselin 03/09/2023, 9:42 PM  00:01 Still in DKA, Insuline drip and dextrose fluids on hold. Waiting for BMP, if K is > 3, resume insuline drip and dextrose fluids as before.  01:11 BMP still not up. Mag ok.  Sugar rising.  Resume DKA protocol and LR. Finished 3/6 Kcl IV. Discussed with RN.   01:41 LR at 125 ml/hr. BMP pending.   02:42 BMP: K at 4, sodium 129.co2 at 21, AG 18.  Continue DKA protocol.   06:08 DKA - K low, but was drawn before K rider. Get K level at 7 AM.

## 2023-03-09 NOTE — Plan of Care (Signed)
PT mentation slowly improving. PT will need to meet with diabetic coordinator and case management prior to being discharged. Insulin drip continued but expected to be stopped pending 8pm lab work. Tolerating clear liquid diet.

## 2023-03-10 LAB — BASIC METABOLIC PANEL
Anion gap: 18 — ABNORMAL HIGH (ref 5–15)
Anion gap: 13 (ref 5–15)
Anion gap: 10 (ref 5–15)
Anion gap: 13 (ref 5–15)
BUN: 14 mg/dL (ref 6–20)
BUN: 15 mg/dL (ref 6–20)
BUN: 13 mg/dL (ref 6–20)
BUN: 9 mg/dL (ref 6–20)
CO2: 21 mmol/L — ABNORMAL LOW (ref 22–32)
CO2: 25 mmol/L (ref 22–32)
CO2: 29 mmol/L (ref 22–32)
CO2: 30 mmol/L (ref 22–32)
Calcium: 7.9 mg/dL — ABNORMAL LOW (ref 8.9–10.3)
Calcium: 8.2 mg/dL — ABNORMAL LOW (ref 8.9–10.3)
Calcium: 8.3 mg/dL — ABNORMAL LOW (ref 8.9–10.3)
Calcium: 8.5 mg/dL — ABNORMAL LOW (ref 8.9–10.3)
Chloride: 90 mmol/L — ABNORMAL LOW (ref 98–111)
Chloride: 96 mmol/L — ABNORMAL LOW (ref 98–111)
Chloride: 98 mmol/L (ref 98–111)
Chloride: 96 mmol/L — ABNORMAL LOW (ref 98–111)
Creatinine, Ser: 1.07 mg/dL (ref 0.61–1.24)
Creatinine, Ser: 1.17 mg/dL (ref 0.61–1.24)
Creatinine, Ser: 0.88 mg/dL (ref 0.61–1.24)
Creatinine, Ser: 0.82 mg/dL (ref 0.61–1.24)
GFR, Estimated: >60 mL/min (ref >60)
GFR, Estimated: >60 mL/min (ref >60)
GFR, Estimated: >60 mL/min (ref >60)
GFR, Estimated: >60 mL/min (ref >60)
Glucose, Bld: 394 mg/dL — ABNORMAL HIGH (ref 70–99)
Glucose, Bld: 226 mg/dL — ABNORMAL HIGH (ref 70–99)
Glucose, Bld: 193 mg/dL — ABNORMAL HIGH (ref 70–99)
Glucose, Bld: 105 mg/dL — ABNORMAL HIGH (ref 70–99)
Potassium: 4 mmol/L (ref 3.5–5.1)
Potassium: 3.4 mmol/L — ABNORMAL LOW (ref 3.5–5.1)
Potassium: 3.1 mmol/L — ABNORMAL LOW (ref 3.5–5.1)
Potassium: 3.1 mmol/L — ABNORMAL LOW (ref 3.5–5.1)
Sodium: 129 mmol/L — ABNORMAL LOW (ref 135–145)
Sodium: 134 mmol/L — ABNORMAL LOW (ref 135–145)
Sodium: 137 mmol/L (ref 135–145)
Sodium: 139 mmol/L (ref 135–145)

## 2023-03-10 LAB — GLUCOSE, CAPILLARY
Glucose-Capillary: 127 mg/dL — ABNORMAL HIGH (ref 70–99)
Glucose-Capillary: 130 mg/dL — ABNORMAL HIGH (ref 70–99)
Glucose-Capillary: 133 mg/dL — ABNORMAL HIGH (ref 70–99)
Glucose-Capillary: 156 mg/dL — ABNORMAL HIGH (ref 70–99)
Glucose-Capillary: 174 mg/dL — ABNORMAL HIGH (ref 70–99)
Glucose-Capillary: 180 mg/dL — ABNORMAL HIGH (ref 70–99)
Glucose-Capillary: 189 mg/dL — ABNORMAL HIGH (ref 70–99)
Glucose-Capillary: 195 mg/dL — ABNORMAL HIGH (ref 70–99)
Glucose-Capillary: 221 mg/dL — ABNORMAL HIGH (ref 70–99)
Glucose-Capillary: 222 mg/dL — ABNORMAL HIGH (ref 70–99)
Glucose-Capillary: 274 mg/dL — ABNORMAL HIGH (ref 70–99)
Glucose-Capillary: 291 mg/dL — ABNORMAL HIGH (ref 70–99)
Glucose-Capillary: 329 mg/dL — ABNORMAL HIGH (ref 70–99)
Glucose-Capillary: 380 mg/dL — ABNORMAL HIGH (ref 70–99)
Glucose-Capillary: 401 mg/dL — ABNORMAL HIGH (ref 70–99)
Glucose-Capillary: 401 mg/dL — ABNORMAL HIGH (ref 70–99)
Glucose-Capillary: 402 mg/dL — ABNORMAL HIGH (ref 70–99)

## 2023-03-10 LAB — HEMOGLOBIN A1C
Hgb A1c MFr Bld: 9.7 % — ABNORMAL HIGH (ref 4.8–5.6)
Mean Plasma Glucose: 232 mg/dL

## 2023-03-10 LAB — BETA-HYDROXYBUTYRIC ACID: Beta-Hydroxybutyric Acid: 0.43 mmol/L — ABNORMAL HIGH (ref 0.05–0.27)

## 2023-03-10 LAB — POTASSIUM: Potassium: 3.1 mmol/L — ABNORMAL LOW (ref 3.5–5.1)

## 2023-03-10 MED ORDER — INSULIN ASPART 100 UNIT/ML IJ SOLN
4.0000 [IU] | Freq: Three times a day (TID) | INTRAMUSCULAR | Status: DC
  Administered 2023-03-10: 4 [IU] via SUBCUTANEOUS

## 2023-03-10 MED ORDER — POTASSIUM CHLORIDE CRYS ER 20 MEQ PO TBCR
40.0000 meq | EXTENDED_RELEASE_TABLET | Freq: Two times a day (BID) | ORAL | Status: AC
  Administered 2023-03-10 – 2023-03-11 (×2): 40 meq via ORAL
  Filled 2023-03-10 (×2): qty 2

## 2023-03-10 MED ORDER — INSULIN GLARGINE-YFGN 100 UNIT/ML ~~LOC~~ SOLN
15.0000 [IU] | Freq: Two times a day (BID) | SUBCUTANEOUS | Status: DC
  Administered 2023-03-10 – 2023-03-11 (×2): 15 [IU] via SUBCUTANEOUS
  Filled 2023-03-10 (×3): qty 0.15

## 2023-03-10 MED ORDER — INSULIN GLARGINE-YFGN 100 UNIT/ML ~~LOC~~ SOLN
15.0000 [IU] | Freq: Every day | SUBCUTANEOUS | Status: DC
  Administered 2023-03-10: 15 [IU] via SUBCUTANEOUS
  Filled 2023-03-10: qty 0.15

## 2023-03-10 MED ORDER — POTASSIUM CHLORIDE 10 MEQ/100ML IV SOLN
10.0000 meq | INTRAVENOUS | Status: DC
  Administered 2023-03-10: 10 meq via INTRAVENOUS
  Filled 2023-03-10: qty 100

## 2023-03-10 MED ORDER — INSULIN ASPART 100 UNIT/ML IJ SOLN: 0.0000 [IU] | Freq: Every day | INTRAMUSCULAR | Status: DC

## 2023-03-10 MED ORDER — INSULIN ASPART 100 UNIT/ML IJ SOLN
8.0000 [IU] | Freq: Three times a day (TID) | INTRAMUSCULAR | Status: DC
  Administered 2023-03-10: 8 [IU] via SUBCUTANEOUS

## 2023-03-10 MED ORDER — LACTATED RINGERS IV SOLN: INTRAVENOUS | Status: DC

## 2023-03-10 MED ORDER — INSULIN ASPART 100 UNIT/ML IJ SOLN
0.0000 [IU] | Freq: Three times a day (TID) | INTRAMUSCULAR | Status: DC
  Administered 2023-03-10 (×2): 8 [IU] via SUBCUTANEOUS
  Administered 2023-03-11: 3 [IU] via SUBCUTANEOUS
  Administered 2023-03-11: 15 [IU] via SUBCUTANEOUS

## 2023-03-10 NOTE — TOC Initial Note (Signed)
Transition of Care [facility]) - Initial/Assessment Note    Patient Details  Name: Mason Mclaughlin MRN: [id number] Date of Birth: 1995-07-12  Transition of Care [facility]) CM/SW Contact:    Lavenia Atlas, RN Phone Number: 03/10/2023, 2:56 PM  Clinical Narrative:   Per chart review patient currently in [facility] ICU for DKA. Received TOC consult for medication assistance. This RNCM spoke with patient at bedside who request this RNCM speak with his mother Anuj Summons. This RNCM spoke with patient's mother to introduce self and role. Explained MATCH program and $3 per medication per copay. Patient's mother reports patient can afford the copay. Patient's mother reports patient sees [facility] and [facility]in[facility] in [place] and does not wish to change to [place] providers. Patient is eligible for MATCH and will need set up closer to discharge.   Transportation at discharge: family (mother or grandmother)  TOC following for discharge needs.                Expected Discharge Plan: Home/Self Care Barriers to Discharge: Continued Medical Work up   Patient Goals and CMS Choice Patient states their goals for this hospitalization and ongoing recovery are:: return home feeling better CMS Medicare.gov Compare Post Acute Care list provided to:: Patient Choice offered to / list presented to : Patient Carmi ownership interest in [facility]provided to:: Patient    Expected Discharge Plan and Services In-house Referral: NA Discharge Planning Services: CM Consult Post Acute Care Choice: NA                   DME Arranged: N/A DME Agency: NA         HH Agency: NA        Prior Living Arrangements/Services   Lives with:: Parents Patient language and need for interpreter reviewed:: Yes        Need for Family Participation in Patient Care: No (Comment) Care giver support system in place?: Yes (comment) Current home services: Other (comment) (none) Criminal Activity/Legal  Involvement Pertinent to Current Situation/Hospitalization: No - Comment as needed  Activities of Daily Living   ADL Screening (condition at time of admission) Independently performs ADLs?: Yes (appropriate for developmental age) Is the patient deaf or have difficulty hearing?: No Does the patient have difficulty seeing, even when wearing glasses/contacts?: No Does the patient have difficulty concentrating, remembering, or making decisions?: No  Permission Sought/Granted Permission sought to share information with : Case Manager Permission granted to share information with : Yes, Verbal Permission Granted  Share Information with NAME: Case Manager           Emotional Assessment Appearance:: Appears stated age Attitude/Demeanor/Rapport: Gracious Affect (typically observed): Accepting Orientation: : Oriented to Self, Oriented to Place, Oriented to  Time Alcohol / Substance Use: Not Applicable Psych Involvement: No (comment)  Admission diagnosis:  DKA (diabetic ketoacidosis) (HCC) [E11.10] AKI (acute kidney injury) (HCC) [N17.9] Diabetic ketoacidosis without coma associated with type 1 diabetes mellitus (HCC) [E10.10] Patient Active Problem List   Diagnosis Date Noted   Hyponatremia 08/18/2022   Lactic acidosis 01/16/2022   SIRS (systemic inflammatory response syndrome) (HCC) 01/16/2022   Volume depletion, gastrointestinal loss 01/16/2022   Diabetic gastroparesis (HCC) 01/16/2022   DKA (diabetic ketoacidosis) (HCC) 05/05/2020   Nausea & vomiting 05/05/2020   Hyperkalemia 11/23/2019   Leukocytosis 11/23/2019   Hypertension complicating diabetes (HCC) 11/23/2019   Type 1 diabetes mellitus with ketoacidosis without coma (HCC)    Hyperglycemia 07/06/2019   Elevated blood pressure  reading 07/06/2019   ARF (acute renal failure) (HCC) 04/17/2018   AKI (acute kidney injury) (HCC)    Nausea and vomiting    DKA (diabetic ketoacidosis) (HCC) 05/29/2017   Type 1 diabetes mellitus not  at goal [facility]) 09/12/2011   PCP:  Harvel Quale, MD Pharmacy:   [facility] [address], [place] - [address] [place] [address] Phone: [phone] Fax: [phone]  [place] [facility] [address] [place] [address] Phone: [phone] Fax: [phone]     Social Determinants of Health (SDOH) Social History: SDOH Screenings   Food Insecurity: No Food Insecurity (03/08/2023)  Housing: Low Risk  (03/08/2023)  Transportation Needs: No Transportation Needs (03/08/2023)  Utilities: Not At Risk (03/08/2023)  Financial Resource Strain: Low Risk  (08/31/2022)   Received from [facility], [facility]  Tobacco Use: Low Risk  (03/08/2023)   SDOH Interventions:     Readmission Risk Interventions    03/10/2023    2:20 PM  Readmission Risk Prevention Plan  Post Dischage Appt Complete  Medication Screening Complete  Transportation Screening Complete

## 2023-03-10 NOTE — Progress Notes (Signed)
BMP not resulted as the previous sample was not enough. Phlebotomy is currently redrawing labs for BMP. Spoke to [facility] doctor [place] via telephone and was told to resume the insulin gtt due to an increase in blood sugar and to change IVF to LR. Awaiting updated orders to do so.

## 2023-03-10 NOTE — Discharge Summary (Signed)
Physician Discharge Summary         Patient ID: Mason Mclaughlin MRN: [id number] DOB/AGE: Jun 06, 1995 27 y.o.  Admit date: 03/08/2023 Discharge date: 03/11/2023  Discharge Diagnoses:    Type 1 DM with DKA, poorly controlled AGMA/ lactic acidosis  Acute metabolic encephalopathy - improving  Leukocytosis  Tachycardia/ atrial arrhythmia - improved  -EKG read as afib w/ RVR, noted irregular with rate in 200's, possible was Afib vs ST with PACs -Likely stress/ demand, profound dehydration with multiple metabolic derangements.  Currently NSR with PAC's Borderline prolonged QTc HTN Pseudohyperkalemia in the setting of DKA Elevated t.bili - Improving  - hx of intermittently elevated with DKA.  Abd exam benign.   Discharge summary    HPI obtained from EMR as patient is encephalopathic.     12 yoM with PMH of T1DM, diabetic gastroparesis, HTN, and cyclic vomiting syndrome who presented from home by EMS with hyperglycemia, lethargy, tachycardia, and tachypnea.  Reported insulin pump came off yesterday and unable to get back on.  Recent admit at [facility] 8/9-8/11 for DKA.     In ER afebrile, normotensive, normoxia, with labs significant for glucose > 1200, bicarb < 7, AKI w/ sCr 3.84, K 6.6, t. Bili 2.2, WBC 29.5, beta-hydroxybutyric acid > 8, and PH 7.09.  Treated with 3L LR ordered, 1L given thus far, insulin gtt, and calcium gluconate 2gm.  Developed HR 200's with EKG showing Afib with RVR improved to ST with PAC's after diltiazem 10mg . CTH neg. He remains confused and protecting his airway.  PCCM called for admit.    Able to speak to mother by phone, reports was in normal state of health until Sunday, ran out of his insulin and was feeling bad.  Has been using insulin pump but no dexcom as trying to get on parents insurance.  Mother paid for insulin but not sure if he was using. No known N/V, fever, or other complaints.  Due to severe acidosis with AKI and arrhythmia patient was admitted per  PCCM for management of severe DKA. He remained on insulin drip and received aggressive electrolyte replacements until acidosis resolved. At that time patient was transition to SSI and tolerated well. By am 03/11/2023,  patient was back to baseline mentation, ambulating, and tolerating oral diet and was discharged home.     Discharge Plan by Active Problems   Type 1 DM with DKA, poorly controlled AGMA/ lactic acidosis  -Hemoglobin A1c 9.0 March 2024 P: Continue SSI with long acting insulin at discharge (18U semglee BID and 8-10U novolog with meals) Needs to follow up with outpatient endocrinologist prior to resumption of insulin pump  Follow up with PCP and endocrinologist  Education provided per diabetes coordinator   Acute metabolic encephalopathy - resolved  P: Back to baseline mentation by evening 10/17   Leukocytosis  -Suspect reactive, no reported infectious symptoms/ fevers, afebrile here.  No reports of vomiting, lungs clear. However PCT is elevated at 15.16 and WBC remains elevated despite IV hydration. Antibiotics stopped, and pt afebrile.    Resolved problems: Tachycardia/ atrial arrhythmia  Borderline prolonged QTc HTN Pseudohyperkalemia in the setting of DKA Elevated t.bili   Significant Hospital tests/ studies  None  Procedures   None   Culture data/antimicrobials   None    Consults  Diabetes coordinator       Discharge Exam: BP 120/77   Pulse (!) 112   Temp 98.9 F (37.2 C) (Oral)   Resp 16   Ht 5\' 6"  (1.676 m)  Wt 72.9 kg   SpO2 97%   BMI 25.94 kg/m   General: Well appearing male In no acute distress HEENT: ETT, MM pink/moist, PERRL,  Neuro: AAOx4 CV: s1s2 regular rate and rhythm, no murmur, rubs, or gallops,  PULM:  Lungs clear to auscultation bilaterally GI: soft, bowel sounds active in all 4 quadrants, non-tender, non-distended  Extremities: warm/dry, no lower extremity edema  Skin: no rashes or lesions  Labs at discharge   Lab  Results  Component Value Date   CREATININE 1.02 03/11/2023   BUN 10 03/11/2023   NA 131 (L) 03/11/2023   K 3.8 03/11/2023   CL 89 (L) 03/11/2023   CO2 28 03/11/2023   Lab Results  Component Value Date   WBC 24.4 (H) 03/09/2023   HGB 14.3 03/09/2023   HCT 40.4 03/09/2023   MCV 86.3 03/09/2023   PLT 280 03/09/2023   Lab Results  Component Value Date   ALT 35 03/09/2023   AST 64 (H) 03/09/2023   ALKPHOS 85 03/09/2023   BILITOT 1.4 (H) 03/09/2023   No results found for: "INR", "PROTIME"  Current radiological studies    No results found.  Disposition:    Discharge disposition: 01-Home or Self Care       Discharge Instructions     Call MD for:  persistant nausea and vomiting   Complete by: As directed    Call MD for:  temperature >100.4   Complete by: As directed    Diet - low sodium heart healthy   Complete by: As directed    Increase activity slowly   Complete by: As directed        Allergies as of 03/11/2023   No Known Allergies      Medication List     TAKE these medications    insulin glargine-yfgn 100 UNIT/ML Pen Commonly known as: SEMGLEE Inject 18 Units into the skin 2 (two) times daily.   insulin pump Soln Inject 1 each into the skin continuous. Medication: Novolog   lisinopril 40 MG tablet Commonly known as: ZESTRIL Take 1 tablet (40 mg total) by mouth every morning.   NovoLOG 100 UNIT/ML injection Generic drug: insulin aspart Please insert vial into insulin pump   insulin aspart 100 UNIT/ML FlexPen Commonly known as: NOVOLOG Inject 8-10 Units into the skin daily with meals   ondansetron 4 MG tablet Commonly known as: ZOFRAN Take 1 tablet (4 mg total) by mouth every 8 (eight) hours as needed for up to 20 doses for nausea or vomiting.   Pen Needles 32G X 4 MM Misc Use as directed   potassium chloride SA 20 MEQ tablet Commonly known as: KLOR-CON M Take 1 tablet (20 mEq total) by mouth daily for 3 days.   promethazine 25 MG  tablet Commonly known as: PHENERGAN Take 1 tablet (25 mg total) by mouth every 6 (six) hours as needed for refractory nausea / vomiting.         Follow-up appointment     Discharge Condition:    good   Signed: Sharnae Winfree 03/11/2023, 1:23 PM

## 2023-03-10 NOTE — Plan of Care (Signed)
  Problem: Metabolic: Goal: Ability to maintain appropriate glucose levels will improve Outcome: Progressing   Problem: Respiratory: Goal: Will regain and/or maintain adequate ventilation Outcome: Progressing   Problem: Skin Integrity: Goal: Risk for impaired skin integrity will decrease Outcome: Progressing   Problem: Activity: Goal: Risk for activity intolerance will decrease Outcome: Progressing

## 2023-03-10 NOTE — Plan of Care (Signed)
  Problem: Education: Goal: Ability to describe self-care measures that may prevent or decrease complications (Diabetes Survival Skills Education) will improve Outcome: Progressing Goal: Individualized Educational Video(s) Outcome: Progressing   Problem: Cardiac: Goal: Ability to maintain an adequate cardiac output will improve Outcome: Progressing   Problem: Health Behavior/Discharge Planning: Goal: Ability to identify and utilize available resources and services will improve Outcome: Progressing Goal: Ability to manage health-related needs will improve Outcome: Progressing   Problem: Fluid Volume: Goal: Ability to achieve a balanced intake and output will improve Outcome: Progressing   Problem: Nutritional: Goal: Maintenance of adequate nutrition will improve Outcome: Progressing Goal: Maintenance of adequate weight for body size and type will improve Outcome: Progressing   Problem: Respiratory: Goal: Will regain and/or maintain adequate ventilation Outcome: Progressing   Problem: Urinary Elimination: Goal: Ability to achieve and maintain adequate renal perfusion and functioning will improve Outcome: Progressing   Problem: Education: Goal: Ability to describe self-care measures that may prevent or decrease complications (Diabetes Survival Skills Education) will improve Outcome: Progressing Goal: Individualized Educational Video(s) Outcome: Progressing   Problem: Coping: Goal: Ability to adjust to condition or change in health will improve Outcome: Progressing   Problem: Fluid Volume: Goal: Ability to maintain a balanced intake and output will improve Outcome: Progressing   Problem: Health Behavior/Discharge Planning: Goal: Ability to identify and utilize available resources and services will improve Outcome: Progressing Goal: Ability to manage health-related needs will improve Outcome: Progressing   Problem: Metabolic: Goal: Ability to maintain appropriate  glucose levels will improve Outcome: Progressing   Problem: Nutritional: Goal: Maintenance of adequate nutrition will improve Outcome: Progressing Goal: Progress toward achieving an optimal weight will improve Outcome: Progressing   Problem: Skin Integrity: Goal: Risk for impaired skin integrity will decrease Outcome: Progressing   Problem: Tissue Perfusion: Goal: Adequacy of tissue perfusion will improve Outcome: Progressing   Problem: Education: Goal: Knowledge of General Education information will improve Description: Including pain rating scale, medication(s)/side effects and non-pharmacologic comfort measures Outcome: Progressing   Problem: Health Behavior/Discharge Planning: Goal: Ability to manage health-related needs will improve Outcome: Progressing   Problem: Clinical Measurements: Goal: Will remain free from infection Outcome: Progressing Goal: Diagnostic test results will improve Outcome: Progressing Goal: Respiratory complications will improve Outcome: Progressing Goal: Cardiovascular complication will be avoided Outcome: Progressing   Problem: Activity: Goal: Risk for activity intolerance will decrease Outcome: Progressing   Problem: Nutrition: Goal: Adequate nutrition will be maintained Outcome: Progressing   Problem: Coping: Goal: Level of anxiety will decrease Outcome: Progressing   Problem: Elimination: Goal: Will not experience complications related to bowel motility Outcome: Progressing Goal: Will not experience complications related to urinary retention Outcome: Progressing   Problem: Pain Managment: Goal: General experience of comfort will improve Outcome: Progressing   Problem: Safety: Goal: Ability to remain free from injury will improve Outcome: Progressing   Problem: Skin Integrity: Goal: Risk for impaired skin integrity will decrease Outcome: Progressing   Problem: Safety: Goal: Non-violent Restraint(s) Outcome:  Progressing   Problem: Metabolic: Goal: Ability to maintain appropriate glucose levels will improve Outcome: Not Progressing   Problem: Clinical Measurements: Goal: Ability to maintain clinical measurements within normal limits will improve Outcome: Not Progressing

## 2023-03-10 NOTE — Progress Notes (Signed)
NAME:  Mason Mclaughlin, MRN:  [id number], DOB:  29-Jun-1995, LOS: 2 ADMISSION DATE:  03/08/2023, CONSULTATION DATE:  03/08/23 REFERRING MD:  Melton Alar, PA, CHIEF COMPLAINT:  AMS   History of Present Illness:  HPI obtained from EMR as patient is encephalopathic.    35 yoM with PMH of T1DM, diabetic gastroparesis, HTN, and cyclic vomiting syndrome who presented from home by EMS with hyperglycemia, lethargy, tachycardia, and tachypnea.  Reported insulin pump came off yesterday and unable to get back on.  Recent admit at [facility] 8/9-8/11 for DKA.    In ER afebrile, normotensive, normoxia, with labs significant for glucose > 1200, bicarb < 7, AKI w/ sCr 3.84, K 6.6, t. Bili 2.2, WBC 29.5, beta-hydroxybutyric acid > 8, and PH 7.09.  Treated with 3L LR ordered, 1L given thus far, insulin gtt, and calcium gluconate 2gm.  Developed HR 200's with EKG showing Afib with RVR improved to ST with PAC's after diltiazem 10mg . CTH neg. He remains confused and protecting his airway.  PCCM called for admit.   - able to speak to mother by phone, reports was in normal state of health until Sunday, ran out of his insulin and was feeling bad.  Has been using insulin pump but no dexcom as trying to get on parents insurance.  Mother paid for insulin but not sure if he was using. No known N/V, fever, or other complaints.    Pertinent  Medical History  T1DM, diabetic gastroparesis, HTN, cyclic vomiting syndrome  Significant Hospital Events: Including procedures, antibiotic start and stop dates in addition to other pertinent events   10/15 admitted DKA, AKI 10/16 Remains on insulin drip with open gap and elevated beta hydrox, hypertensive and mildly tachycardic. Agitated overnight with fall after trying to ambulate to bathroom  10/17 Hypokalemia overnight resulting in pause of insulin drip. Tachycardia improved but hypertension persist   Interim History / Subjective:  Opens eyes to verbal stimuli but will no engage with  assessment   Objective   Blood pressure (!) 167/112, pulse (!) 110, temperature 98.7 F (37.1 C), temperature source Oral, resp. rate 16, height 5\' 6"  (1.676 m), weight 72.9 kg, SpO2 99%.    HR is currently 112, ST     Intake/Output Summary (Last 24 hours) at 03/10/2023 0733 Last data filed at 03/10/2023 4098 Gross per 24 hour  Intake 5138.93 ml  Output 3910 ml  Net 1228.93 ml   Filed Weights   03/08/23 1717 03/09/23 0500 03/10/23 0405  Weight: 68.8 kg 68.8 kg 72.9 kg    Examination: General: Acute ill appearing adult male lying in bed in in NAD HEENT: [place]/AT, MM pink/moist, PERRL,  Neuro: Alert but withdrawn and did not engage with staff for assessment  CV: s1s2 regular rate and rhythm, no murmur, rubs, or gallops,  PULM:  Clear to ascultation, no increased work of breathing, on RA GI: soft, bowel sounds active in all 4 quadrants, non-tender, non-distended Extremities: warm/dry, no edema  Skin: no rashes or lesions  Resolved Hospital Problem list   AKI  Assessment & Plan:   Type 1 DM with DKA, poorly controlled AGMA/ lactic acidosis  -Hemoglobin A1c 9.0 March 2024 P: Ongoing IV hydration  Insulin Drip  Trend Bmet  Q1 hrs CBG  Can have sip of carb mod liquids  Likely able to transition off insulin drip today  Diabetes coordinator following  Need diabetes education  TOC consulted   Acute metabolic encephalopathy - improving  P: Maintain neuro protective measures  Nutrition and bowel regiment  Aspirations precautions  Leukocytosis  -Suspect reactive, no reported infectious symptoms/ fevers, afebrile here.  No reports of vomiting, lungs clear. However PCT is elevated at 15.16 and WBC remains elevated at 24.4 despite IV hydration  P: Once patient able to engage  Continue empiric Ceftriaxone  Trend CBC and fever curve ABD exam   Tachycardia/ atrial arrhythmia - improved  -EKG read as afib w/ RVR, noted irregular with rate in 200's, possible was Afib vs ST  with PACs -Likely stress/ demand, profound dehydration with multiple metabolic derangements.  Currently NSR with PAC's Borderline prolonged QTc HTN P: Continuous telemetry  Optimize electrolytes  Ongoing metabolic derangements  Consider starting oral antihypertensive  PRN IV antihypertensives   Pseudohyperkalemia in the setting of DKA P: IV hydration   Elevated t.bili - Improving  - hx of intermittently elevated with DKA.  Abd exam benign.  P: Trend    Best Practice (right click and "Reselect all SmartList Selections" daily)   Diet/type: NPO DVT prophylaxis: prophylactic heparin  GI prophylaxis: N/A Lines: N/A Foley:  N/A Code Status:  full code Last date of multidisciplinary goals of care discussion [10/15]  Mother, Dex Blakely updated by phone [phone] Critical care time:    CRITICAL CARE Performed by: Jahniah Pallas D. Harris  Total critical care time: 39 minutes  Critical care time was exclusive of separately billable procedures and treating other patients.  Critical care was necessary to treat or prevent imminent or life-threatening deterioration.  Critical care was time spent personally by me on the following activities: development of treatment plan with patient and/or surrogate as well as nursing, discussions with consultants, evaluation of patient's response to treatment, examination of patient, obtaining history from patient or surrogate, ordering and performing treatments and interventions, ordering and review of laboratory studies, ordering and review of radiographic studies, pulse oximetry and re-evaluation of patient's condition.  Jerline Linzy D. Harris, NP-C [facility] Personal contact information can be found on Amion  If no contact or response made please call [phone] 03/10/2023, 7:33 AM

## 2023-03-10 NOTE — Inpatient Diabetes Management (Signed)
Inpatient Diabetes Program Recommendations  AACE/ADA: New Consensus Statement on Inpatient Glycemic Control (2015)  Target Ranges:  Prepandial:   less than 140 mg/dL      Peak postprandial:   less than 180 mg/dL (1-2 hours)      Critically ill patients:  140 - 180 mg/dL   Lab Results  Component Value Date   GLUCAP 189 (H) 03/10/2023   HGBA1C 9.7 (H) 03/08/2023    Review of Glycemic Control  Diabetes history: DM1 Outpatient Diabetes medications: Insulin pump with Novolog Current orders for Inpatient glycemic control: Semglee 15 daily, Novolog 0-15 TID with meals and 0-5 HS + 4 units TID  Inpatient Diabetes Program Recommendations:    Total basal - 33.25 units Total bolus - 1:5 CHO ratio and 1 unit drops BG 30 mg/dL  Long discussion with pt, grandmother and mother who was listening by phone, regarding pt's diabetes control with HgbA1C of 9.7%. Also discussed insulin pump, CGM, and events that led up to hospitalization.   Semglee 15 units BID  Novolog 0-9 TID with meals and 0-5 HS  Novolog 8 units TID with meals if eating > 50%  Pt to be discharged on both basal and bolus insulin (Lantus and Novolog) and wait to restart pump after seeing Endo at [facility]. States CGM Dexcom was not synced with pump. Pt needed basal insulin for back-up when pump was not on.  Follow.

## 2023-03-11 ENCOUNTER — Other Ambulatory Visit (HOSPITAL_COMMUNITY): Payer: Self-pay

## 2023-03-11 LAB — GLUCOSE, CAPILLARY
Glucose-Capillary: 356 mg/dL — ABNORMAL HIGH (ref 70–99)
Glucose-Capillary: 199 mg/dL — ABNORMAL HIGH (ref 70–99)

## 2023-03-11 LAB — BASIC METABOLIC PANEL
Anion gap: 14 (ref 5–15)
BUN: 10 mg/dL (ref 6–20)
CO2: 28 mmol/L (ref 22–32)
Calcium: 8.8 mg/dL — ABNORMAL LOW (ref 8.9–10.3)
Chloride: 89 mmol/L — ABNORMAL LOW (ref 98–111)
Creatinine, Ser: 1.02 mg/dL (ref 0.61–1.24)
GFR, Estimated: >60 mL/min (ref >60)
Glucose, Bld: 334 mg/dL — ABNORMAL HIGH (ref 70–99)
Potassium: 3.8 mmol/L (ref 3.5–5.1)
Sodium: 131 mmol/L — ABNORMAL LOW (ref 135–145)

## 2023-03-11 LAB — PROCALCITONIN: Procalcitonin: 1.24 ng/mL

## 2023-03-11 MED ORDER — ONDANSETRON HCL 4 MG/2ML IJ SOLN: 4.0000 mg | Freq: Four times a day (QID) | INTRAMUSCULAR | Status: DC | PRN

## 2023-03-11 MED ORDER — INSULIN ASPART 100 UNIT/ML FLEXPEN
8.0000 [IU] | PEN_INJECTOR | Freq: Three times a day (TID) | SUBCUTANEOUS | 0 refills | Status: DC
Start: 2023-03-11 — End: 2023-08-10
  Filled 2023-03-11: qty 9, 30d supply, fill #0

## 2023-03-11 MED ORDER — SODIUM CHLORIDE 0.9 % IV SOLN: INTRAVENOUS | Status: DC | PRN

## 2023-03-11 MED ORDER — PROCHLORPERAZINE EDISYLATE 10 MG/2ML IJ SOLN
10.0000 mg | Freq: Four times a day (QID) | INTRAMUSCULAR | Status: DC | PRN
  Administered 2023-03-11: 10 mg via INTRAVENOUS
  Filled 2023-03-11: qty 2

## 2023-03-11 MED ORDER — TRAZODONE HCL 50 MG PO TABS
100.0000 mg | ORAL_TABLET | Freq: Every evening | ORAL | Status: DC | PRN
  Administered 2023-03-11: 100 mg via ORAL
  Filled 2023-03-11: qty 2

## 2023-03-11 MED ORDER — PEN NEEDLES 32G X 4 MM MISC
0 refills | Status: AC
Start: 2023-03-11 — End: ?
  Filled 2023-03-11: qty 100, 17d supply, fill #0

## 2023-03-11 MED ORDER — INSULIN GLARGINE-YFGN 100 UNIT/ML ~~LOC~~ SOPN
18.0000 [IU] | PEN_INJECTOR | Freq: Two times a day (BID) | SUBCUTANEOUS | 0 refills | Status: DC
  Filled 2023-03-11: qty 3, 8d supply, fill #0

## 2023-03-11 MED ORDER — INSULIN ASPART 100 UNIT/ML IJ SOLN
INTRAMUSCULAR | 0 refills | Status: AC
  Filled 2023-03-11: qty 10, 30d supply, fill #0

## 2023-03-11 NOTE — Progress Notes (Addendum)
MATCH MEDICATION ASSISTANCE CARD Pharmacies please call [phone] for claim processing assistance.  Rx BIN: [id number] Rx Group: [id number] Rx PCN: [id number] Relationship Code: 1 Person Code: 01  Patient ID (MRN): [id number] MOSES    Patient Name: Mason Mclaughlin   Patient DOB: Jul 16, 1995   Discharge Date:03/11/2023  Expiration Date: 03/19/2023 (must be filled within 7 days of discharge)   Dear [name] have been approved to have the prescriptions written by your discharging physician filled through our [facility] (Medication Assistance Through [facility]) program. This program allows for a one-time (no refills) 34-day supply of selected medications for a low copay amount.  The copay is $3.00 per prescription. For instance, if you have one prescription, you will pay $3.00; for two prescriptions, you pay $6.00; for three prescriptions, you pay $9.00; and so on. Only certain pharmacies are participating in this program with [facility]. You will need to select one of the pharmacies from the attached lists and take your prescriptions, this letter, and your photo ID to one of the participating pharmacies.  We are excited that you are able to use the [facility] program to get your medications. These prescriptions must be filled within 7 days of hospital discharge or they will no longer be valid for the [facility] program. Should you have any problems with your prescriptions please contact your case management team member at [phone] for [facility] [place] or [phone] for [facility].  Thank you, [facility]

## 2023-03-11 NOTE — Discharge Instructions (Signed)
It was a pleasure taking care of you in the hospital. You were brought in for diabetic ketoacidosis, which is most likely because you ran out of insulin. I am sending in a refill of the novolog that should be delivered to your bed, and will help you set up the pump before you leave. Please see your primary care physician and your endocrinologist as soon as you can.   In case you cannot see them, I have scheduled you an appointment with me on 03/30/23 at 2:15, however if you're able to see your doctors before then, feel free to cancel or miss the appointment.

## 2023-03-11 NOTE — Progress Notes (Signed)
Discharge orders received. Discharge instructions provided to and discussed with patient. All questions answered. Pt is aware of his follow up appointment. 2 deliveries received from the [facility] pharmacy that were provided to patient. Supplies for both his home insulin pump and for subcutaneous injections. IV removed. Pt opted to walk out with his friends/ride, per request at 1350.

## 2023-03-11 NOTE — Plan of Care (Signed)
  Problem: Education: Goal: Ability to describe self-care measures that may prevent or decrease complications (Diabetes Survival Skills Education) will improve Outcome: Progressing Goal: Individualized Educational Video(s) Outcome: Progressing   Problem: Cardiac: Goal: Ability to maintain an adequate cardiac output will improve Outcome: Progressing   Problem: Health Behavior/Discharge Planning: Goal: Ability to identify and utilize available resources and services will improve Outcome: Progressing Goal: Ability to manage health-related needs will improve Outcome: Progressing   Problem: Fluid Volume: Goal: Ability to achieve a balanced intake and output will improve Outcome: Progressing   Problem: Nutritional: Goal: Maintenance of adequate nutrition will improve Outcome: Progressing Goal: Maintenance of adequate weight for body size and type will improve Outcome: Progressing   Problem: Respiratory: Goal: Will regain and/or maintain adequate ventilation Outcome: Progressing   Problem: Urinary Elimination: Goal: Ability to achieve and maintain adequate renal perfusion and functioning will improve Outcome: Progressing   Problem: Education: Goal: Ability to describe self-care measures that may prevent or decrease complications (Diabetes Survival Skills Education) will improve Outcome: Progressing Goal: Individualized Educational Video(s) Outcome: Progressing   Problem: Coping: Goal: Ability to adjust to condition or change in health will improve Outcome: Progressing   Problem: Fluid Volume: Goal: Ability to maintain a balanced intake and output will improve Outcome: Progressing   Problem: Health Behavior/Discharge Planning: Goal: Ability to identify and utilize available resources and services will improve Outcome: Progressing Goal: Ability to manage health-related needs will improve Outcome: Progressing   Problem: Metabolic: Goal: Ability to maintain appropriate  glucose levels will improve Outcome: Progressing   Problem: Nutritional: Goal: Maintenance of adequate nutrition will improve Outcome: Progressing Goal: Progress toward achieving an optimal weight will improve Outcome: Progressing   Problem: Skin Integrity: Goal: Risk for impaired skin integrity will decrease Outcome: Progressing   Problem: Tissue Perfusion: Goal: Adequacy of tissue perfusion will improve Outcome: Progressing   Problem: Education: Goal: Knowledge of General Education information will improve Description: Including pain rating scale, medication(s)/side effects and non-pharmacologic comfort measures Outcome: Progressing   Problem: Health Behavior/Discharge Planning: Goal: Ability to manage health-related needs will improve Outcome: Progressing   Problem: Clinical Measurements: Goal: Ability to maintain clinical measurements within normal limits will improve Outcome: Progressing Goal: Will remain free from infection Outcome: Progressing Goal: Diagnostic test results will improve Outcome: Progressing Goal: Respiratory complications will improve Outcome: Progressing Goal: Cardiovascular complication will be avoided Outcome: Progressing   Problem: Activity: Goal: Risk for activity intolerance will decrease Outcome: Progressing   Problem: Nutrition: Goal: Adequate nutrition will be maintained Outcome: Progressing   Problem: Coping: Goal: Level of anxiety will decrease Outcome: Progressing   Problem: Elimination: Goal: Will not experience complications related to bowel motility Outcome: Progressing Goal: Will not experience complications related to urinary retention Outcome: Progressing   Problem: Pain Managment: Goal: General experience of comfort will improve Outcome: Progressing   Problem: Safety: Goal: Ability to remain free from injury will improve Outcome: Progressing   Problem: Skin Integrity: Goal: Risk for impaired skin integrity will  decrease Outcome: Progressing   Problem: Safety: Goal: Non-violent Restraint(s) Outcome: Progressing   Problem: Education: Goal: Understanding of post-operative needs will improve Outcome: Progressing Goal: Individualized Educational Video(s) Outcome: Progressing   Problem: Clinical Measurements: Goal: Postoperative complications will be avoided or minimized Outcome: Progressing   Problem: Respiratory: Goal: Will regain and/or maintain adequate ventilation Outcome: Progressing

## 2023-03-11 NOTE — Progress Notes (Addendum)
eLink Physician-Brief Progress Note Patient Name: Ezriel Harben DOB: Feb 21, 1996 MRN: [id number]   Date of Service  03/11/2023  HPI/Events of Note  27 year old with type 1 diabetes, hypertension presenting with severe DKA Off insulin drip and eating today.  Insomnia, requesting sleep aid  eICU Interventions  Add Trazodone   0536 -refractory patient/vomiting.  Add Compazine.  Intervention Category Minor Interventions: Routine modifications to care plan (e.g. PRN medications for pain, fever)  Deavon Podgorski 03/11/2023, 1:27 AM

## 2023-03-11 NOTE — Inpatient Diabetes Management (Signed)
Inpatient Diabetes Program Recommendations  AACE/ADA: New Consensus Statement on Inpatient Glycemic Control (2015)  Target Ranges:  Prepandial:   less than 140 mg/dL      Peak postprandial:   less than 180 mg/dL (1-2 hours)      Critically ill patients:  140 - 180 mg/dL   Lab Results  Component Value Date   GLUCAP 356 (H) 03/11/2023   HGBA1C 9.7 (H) 03/08/2023    Review of Glycemic Control  To f/u within 2 weeks to [facility] for pump restart  Inpatient Diabetes Program Recommendations:    Discharge Recommendations: Long acting recommendations: Insulin Glargine-yfgn (SEMGLEE) Pen 18 units BID  Short acting recommendations:  Meal coverage ONLY Insulin aspart (NOVOLOG) FlexPen  8-10 units TID     Use Adult Diabetes Insulin Treatment Post Discharge order set.  Given 2 Dexcom G7 CGMs.  Pt instructions given.  Thank you. Ailene Ards, RD, LDN, CDCES Inpatient Diabetes Coordinator [phone]

## 2023-03-11 NOTE — TOC Progression Note (Signed)
Transition of Care [facility]) - Progression Note    Patient Details  Name: Mason Mclaughlin MRN: [id number] Date of Birth: January 20, 1996  [facility] CM/SW Contact  Beckie Busing, RN Phone Number:[phone]  03/11/2023, 10:04 AM  Clinical Narrative:    MATCH completed for patient with no insurance coverage. Pharmacy updated.    Expected Discharge Plan: Home/Self Care Barriers to Discharge: Continued Medical Work up  Expected Discharge Plan and Services In-house Referral: NA Discharge Planning Services: CM Consult Post Acute Care Choice: NA   Expected Discharge Date: 03/11/23               DME Arranged: N/A DME Agency: NA         HH Agency: NA         Social Determinants of Health (SDOH) Interventions SDOH Screenings   Food Insecurity: No Food Insecurity (03/08/2023)  Housing: Low Risk  (03/08/2023)  Transportation Needs: No Transportation Needs (03/08/2023)  Utilities: Not At Risk (03/08/2023)  Financial Resource Strain: Low Risk  (08/31/2022)   [facility], [facility]  Tobacco Use: Low Risk  (03/08/2023)    Readmission Risk Interventions    03/10/2023    2:20 PM  Readmission Risk Prevention Plan  Post Dischage Appt Complete  Medication Screening Complete  Transportation Screening Complete

## 2023-03-14 NOTE — Plan of Care (Signed)
CHL Tonsillectomy/Adenoidectomy, Postoperative PEDS care plan entered in error.

## 2023-03-30 ENCOUNTER — Ambulatory Visit (INDEPENDENT_AMBULATORY_CARE_PROVIDER_SITE_OTHER): Payer: Self-pay | Admitting: Student

## 2023-03-30 ENCOUNTER — Encounter: Payer: Self-pay | Admitting: Student

## 2023-03-30 ENCOUNTER — Other Ambulatory Visit (HOSPITAL_COMMUNITY): Payer: Self-pay

## 2023-03-30 ENCOUNTER — Other Ambulatory Visit (HOSPITAL_COMMUNITY)
Admission: RE | Admit: 2023-03-30 | Discharge: 2023-03-30 | Disposition: A | Discharge status: Home / Self Care | Payer: MEDICAID | Source: Ambulatory Visit | Attending: Internal Medicine | Admitting: Internal Medicine

## 2023-03-30 VITALS — BP 156/101 | HR 87 | Ht 66.0 in | Wt 161.7 lb

## 2023-03-30 DIAGNOSIS — I10 Essential (primary) hypertension: Secondary | ICD-10-CM

## 2023-03-30 DIAGNOSIS — E1010 Type 1 diabetes mellitus with ketoacidosis without coma: Secondary | ICD-10-CM

## 2023-03-30 DIAGNOSIS — I152 Hypertension secondary to endocrine disorders: Secondary | ICD-10-CM

## 2023-03-30 DIAGNOSIS — E1059 Type 1 diabetes mellitus with other circulatory complications: Secondary | ICD-10-CM

## 2023-03-30 DIAGNOSIS — D72829 Elevated white blood cell count, unspecified: Secondary | ICD-10-CM

## 2023-03-30 DIAGNOSIS — Z113 Encounter for screening for infections with a predominantly sexual mode of transmission: Secondary | ICD-10-CM

## 2023-03-30 DIAGNOSIS — F32A Depression, unspecified: Secondary | ICD-10-CM

## 2023-03-30 MED ORDER — LISINOPRIL 40 MG PO TABS
40.0000 mg | ORAL_TABLET | Freq: Every day | ORAL | 5 refills | Status: DC
  Filled 2023-03-30: qty 30, 30d supply, fill #0

## 2023-03-30 MED ORDER — FLUOXETINE HCL 10 MG PO CAPS
10.0000 mg | ORAL_CAPSULE | Freq: Every day | ORAL | 5 refills | Status: AC
  Filled 2023-03-30: qty 30, 30d supply, fill #0

## 2023-03-30 MED ORDER — AMLODIPINE BESYLATE 5 MG PO TABS
5.0000 mg | ORAL_TABLET | Freq: Every day | ORAL | 11 refills | Status: DC
Start: 2023-03-30 — End: 2023-05-30
  Filled 2023-03-30: qty 30, 30d supply, fill #0

## 2023-03-30 NOTE — Progress Notes (Addendum)
CC: Establishing care  HPI:  [name] is a 27 y.o. male living with a history stated below and presents today to establish care please see problem based assessment and plan for additional details.  He works at [employer], does not smoke cigarettes or have any means of daily nicotine intake, only drinks alcohol socially.  He is currently sexually active with 3 partners and he practices safe sex with condoms.  His family history is positive for hypertension, dementia, and diabetes.    Past Medical History:  Diagnosis Date   Diabetes (HCC)    DKA (diabetic ketoacidoses)    DKA (diabetic ketoacidosis) (HCC) 05/05/2020   DKA, type 1 (HCC)    Insulin pump in place     Current Outpatient Medications on File Prior to Visit  Medication Sig Dispense Refill   insulin aspart (NOVOLOG) 100 UNIT/ML FlexPen Inject 8-10 Units into the skin 3 (three) times daily with meals. 15 mL 0   insulin aspart (NOVOLOG) 100 UNIT/ML injection Please insert vial into insulin pump 10 mL 0   insulin glargine-yfgn (SEMGLEE) 100 UNIT/ML Pen Inject 18 Units into the skin 2 (two) times daily. 3 mL 0   Insulin Human (INSULIN PUMP) SOLN Inject 1 each into the skin continuous. Medication: Novolog     Insulin Pen Needle (PEN NEEDLES) 32G X 4 MM MISC Use as directed 200 each 0   ondansetron (ZOFRAN) 4 MG tablet Take 1 tablet (4 mg total) by mouth every 8 (eight) hours as needed for up to 20 doses for nausea or vomiting. 20 tablet 0   potassium chloride SA (KLOR-CON M) 20 MEQ tablet Take 1 tablet (20 mEq total) by mouth daily for 3 days. 3 tablet 0   promethazine (PHENERGAN) 25 MG tablet Take 1 tablet (25 mg total) by mouth every 6 (six) hours as needed for refractory nausea / vomiting. 12 tablet 0   No current facility-administered medications on file prior to visit.    Family History  Problem Relation Age of Onset   Healthy Mother    Diabetes Father     Social History   Socioeconomic History   Marital  status: Single    Spouse name: Not on file   Number of children: Not on file   Years of education: Not on file   Highest education level: Not on file  Occupational History   Occupation: service agent  Tobacco Use   Smoking status: Never   Smokeless tobacco: Never  Vaping Use   Vaping status: Never Used  Substance and Sexual Activity   Alcohol use: Yes   Drug use: Yes    Frequency: 4.0 times per week    Types: Marijuana   Sexual activity: Not on file  Other Topics Concern   Not on file  Social History Narrative   Not on file   Social Determinants of Health   Financial Resource Strain: Low Risk  (08/31/2022)   Received from [facility], [facility]   Overall Financial Resource Strain (CARDIA)    Difficulty of Paying Living Expenses: Not very hard  Food Insecurity: No Food Insecurity (03/08/2023)   Hunger Vital Sign    Worried About Running Out of Food in the Last Year: Never true    Ran Out of Food in the Last Year: Never true  Transportation Needs: No Transportation Needs (03/08/2023)   PRAPARE - Administrator, Civil Service (Medical): No    Lack of Transportation (  Non-Medical): No  Physical Activity: Not on file  Stress: Not on file  Social Connections: Not on file  Intimate Partner Violence: Not At Risk (03/08/2023)   Humiliation, Afraid, Rape, and Kick questionnaire    Fear of Current or Ex-Partner: No    Emotionally Abused: No    Physically Abused: No    Sexually Abused: No    Review of Systems: ROS negative except for what is noted on the assessment and plan.  Vitals:   03/30/23 1431  BP: (!) 156/101  Pulse: 87  SpO2: 100%  Weight: 161 lb 11.2 oz (73.3 kg)  Height: 5\' 6"  (1.676 m)    Physical Exam: Constitutional: well-appearing male in no acute distress HENT: normocephalic atraumatic, mucous membranes moist Eyes: conjunctiva non-erythematous Neck: supple Cardiovascular: regular rate and rhythm, no  m/r/g Pulmonary/Chest: normal work of breathing on room air, lungs clear to auscultation bilaterally Abdominal: soft, non-tender, non-distended, insulin pump in place MSK: normal bulk and tone Neurological: alert & oriented x 3, 5/5 strength in bilateral upper and lower extremities, normal gait Skin: warm and dry Psych: Normal mood and affect  Assessment & Plan:   Type 1 diabetes mellitus with ketoacidosis without coma (HCC) Patient with a history of type 1 diabetes, followed by endocrinologist Dr. Tiffany Kocher he does have an insulin pump, which is working well.  He has multiple hospitalizations for diabetic ketoacidosis, most recently last month when his insulin pump broke.  Since then, he has been doing well and has gotten a new insulin pump.  He was using a Dexcom to check his sugars, however that one has run out.  He has not followed up with endocrinology just yet, he will call and make an appointment with them.  When he was checking his sugars he denied any lows.  Last A1c checked in the hospital was 9.7.  Plan: - Encouraged patient to make appointment with endocrinology - Will refill Dexcom - Patient will need microalbumin once he has obtained insurance  Hypertension complicating diabetes (HCC) Patient with history of hypertension, for which he has been taking lisinopril 40 mg.  In clinic today, blood pressure is elevated at 150/101.  Per chart review, at one point he was on four different antihypertensive medications including Coreg, hydrochlorothiazide, amlodipine, lisinopril.  Will restart his lisinopril, as well as his amlodipine.  Encouraged patient to check his blood pressures at home for which he does have a cuff, and bring in the log at his next visit.  If blood pressure still remains elevated, would recommend looking for secondary causes of hypertension due to his young age.  Plan: - Lisinopril 40 mg - Amlodipine 5 mg  Screening examination for STI Pt is currently sexually  active with three different partners, requesting STI screening. Will order.   Depression PHQ-9 screening tool at 36, patient has been taking fluoxetine for his depression which he states has been working well.  Will refill.  Leukocytosis Per chart review, patient is an elevated white blood cell count.  Most recently in the hospital his white blood cell count was 24.  He was sent for a referral for hematology, however he has not followed up on this.  He denies any fevers, chills, infectious symptoms.  Offered to check CBC today, however patient is currently working on Museum/gallery curator and once a day, and once his insurance is approved.  Will place order for future lab for CBC, and patient to come in once he does have insurance.  Patient discussed with Dr.  Pauline Good Aireal Slater, M.D. [facility] Internal Medicine, PGY-2 Pager: [phone] Date 04/12/2023 Time 3:44 PM

## 2023-03-30 NOTE — Patient Instructions (Signed)
Thank you so much for coming to the clinic today!   For your blood pressure, I have refilled your lisinopril as well as started a new medication called amlodipine. Please take your blood pressure for at least three times a week and bring it in on your next visit.   I have refilled your fluoxetine, and I will call you with the results of the urine.   Please also make an appointment with your endocrinologist as soon as you can.   If you have any questions please feel free to the call the clinic at anytime at [phone]. It was a pleasure seeing you!  Best, Dr. Thomasene Ripple

## 2023-03-31 DIAGNOSIS — D72829 Elevated white blood cell count, unspecified: Secondary | ICD-10-CM | POA: Insufficient documentation

## 2023-03-31 DIAGNOSIS — Z113 Encounter for screening for infections with a predominantly sexual mode of transmission: Secondary | ICD-10-CM | POA: Insufficient documentation

## 2023-03-31 DIAGNOSIS — F32A Depression, unspecified: Secondary | ICD-10-CM | POA: Insufficient documentation

## 2023-03-31 LAB — URINE CYTOLOGY ANCILLARY ONLY
Chlamydia: NEGATIVE
Comment: NEGATIVE
Comment: NORMAL
Neisseria Gonorrhea: NEGATIVE

## 2023-03-31 NOTE — Assessment & Plan Note (Signed)
Per chart review, patient is an elevated white blood cell count.  Most recently in the hospital his white blood cell count was 24.  He was sent for a referral for hematology, however he has not followed up on this.  He denies any fevers, chills, infectious symptoms.  Offered to check CBC today, however patient is currently working on Museum/gallery curator and once a day, and once his insurance is approved.  Will place order for future lab for CBC, and patient to come in once he does have insurance.

## 2023-03-31 NOTE — Assessment & Plan Note (Signed)
PHQ-9 screening tool at 17, patient has been taking fluoxetine for his depression which he states has been working well.  Will refill.

## 2023-03-31 NOTE — Assessment & Plan Note (Signed)
Patient with a history of type 1 diabetes, followed by endocrinologist Dr. Tiffany Kocher he does have an insulin pump, which is working well.  He has multiple hospitalizations for diabetic ketoacidosis, most recently last month when his insulin pump broke.  Since then, he has been doing well and has gotten a new insulin pump.  He was using a Dexcom to check his sugars, however that one has run out.  He has not followed up with endocrinology just yet, he will call and make an appointment with them.  When he was checking his sugars he denied any lows.  Last A1c checked in the hospital was 9.7.  Plan: - Encouraged patient to make appointment with endocrinology - Will refill Dexcom - Patient will need microalbumin once he has obtained insurance

## 2023-03-31 NOTE — Assessment & Plan Note (Signed)
Pt is currently sexually active with three different partners, requesting STI screening. Will order.

## 2023-03-31 NOTE — Assessment & Plan Note (Addendum)
Patient with history of hypertension, for which he has been taking lisinopril 40 mg.  In clinic today, blood pressure is elevated at 150/101.  Per chart review, at one point he was on four different antihypertensive medications including Coreg, hydrochlorothiazide, amlodipine, lisinopril.  Will restart his lisinopril, as well as his amlodipine.  Encouraged patient to check his blood pressures at home for which he does have a cuff, and bring in the log at his next visit.  If blood pressure still remains elevated, would recommend looking for secondary causes of hypertension due to his young age.  Plan: - Lisinopril 40 mg - Amlodipine 5 mg

## 2023-04-07 NOTE — Progress Notes (Signed)
Internal Medicine Clinic Attending  I was physically present during the key portions of the resident provided service and participated in the medical decision making of patient's management care. I reviewed pertinent patient test results.  The assessment, diagnosis, and plan were formulated together and I agree with the documentation in the resident's note. We reemphasized the importance of getting control of his DM and HTN.    Miguel Aschoff, MD

## 2023-05-23 IMAGING — CT CT ABD-PELV W/ CM
2 of 4 series · 16 of 46 positions shown, 18 images · IV contrast (omnipaque)
Comparison: 09/11/2020

CLINICAL DATA: Unspecified abdominal pain, nausea, vomiting,
hyperglycemia

EXAM:
CT ABDOMEN AND PELVIS WITH CONTRAST
TECHNIQUE: Multidetector CT imaging of the abdomen and pelvis was performed
using the standard protocol following bolus administration of
intravenous contrast.
CONTRAST:  80mL OMNIPAQUE IOHEXOL 350 MG/ML SOLN

[Series 2: axial st · axial · 0.71mm/px · z∈[+980,+1360]mm · 13 of 86 slices shown, 15 images]
[im 5/86  soft-tissue]
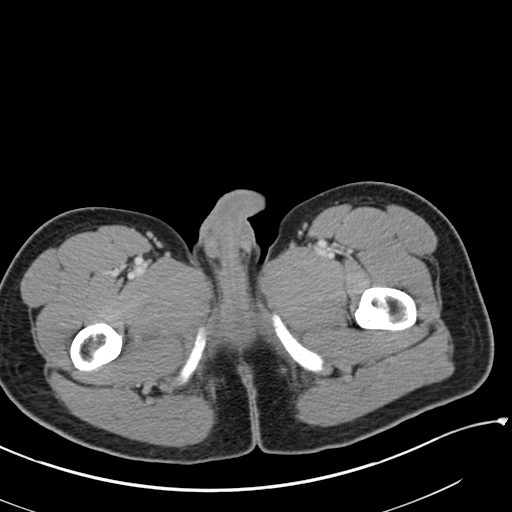
[im 5/86  bone]
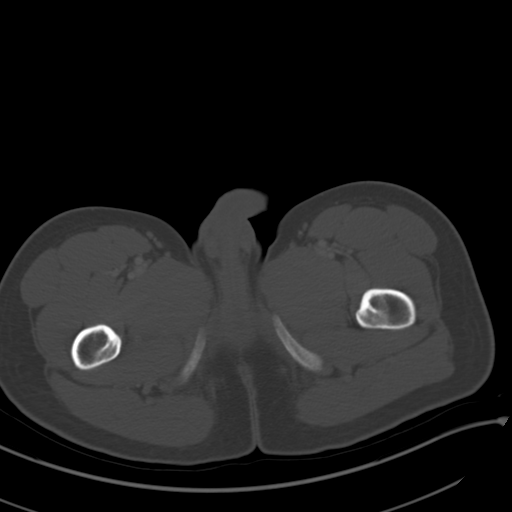
[im 13/86  soft-tissue]
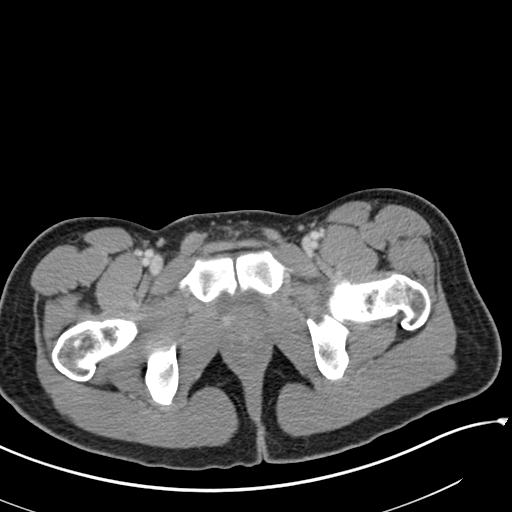
[im 18/86  soft-tissue]
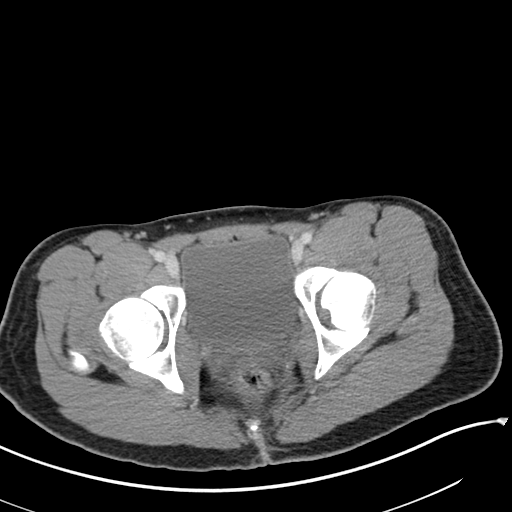
[im 26/86  soft-tissue]
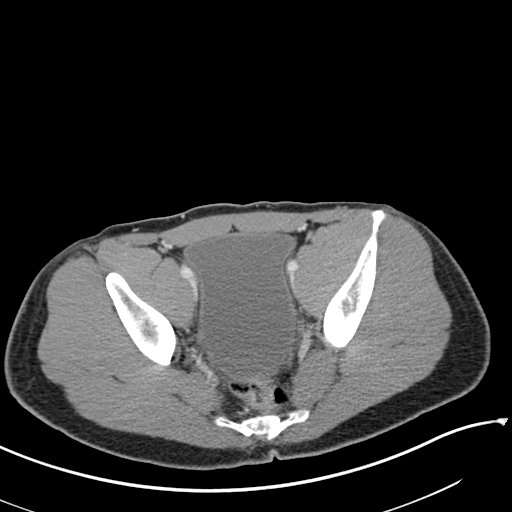
[im 30/86  soft-tissue]
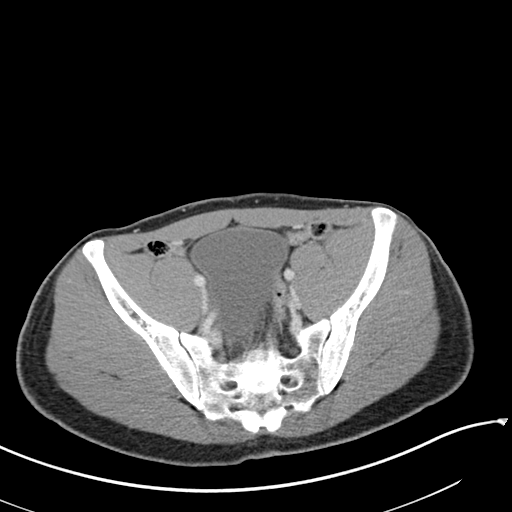
[im 39/86  soft-tissue]
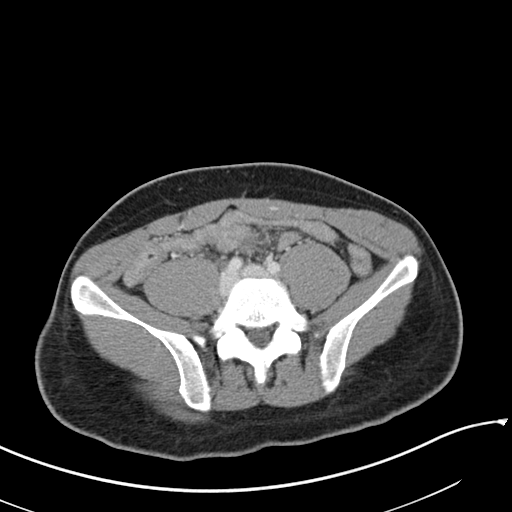
[im 43/86  soft-tissue]
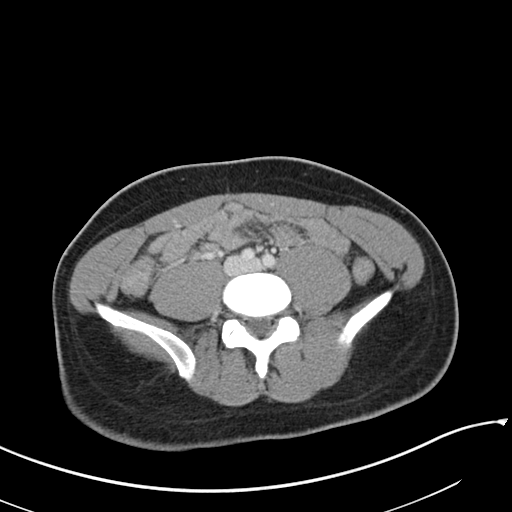
[im 47/86  soft-tissue]
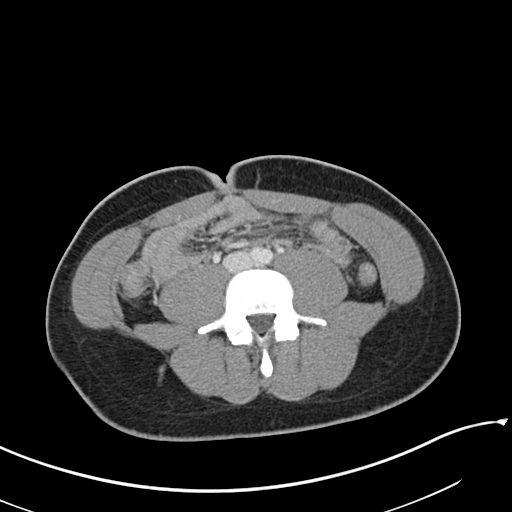
[im 56/86  soft-tissue]
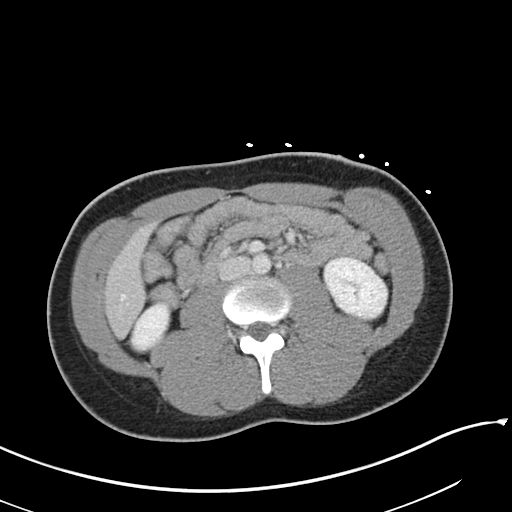
[im 56/86  bone]
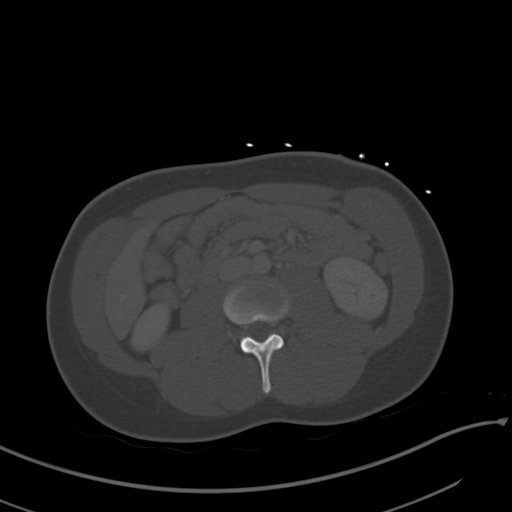
[im 60/86  soft-tissue]
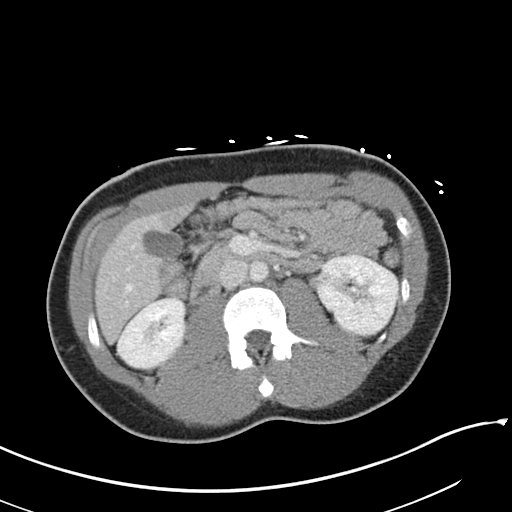
[im 69/86  soft-tissue]
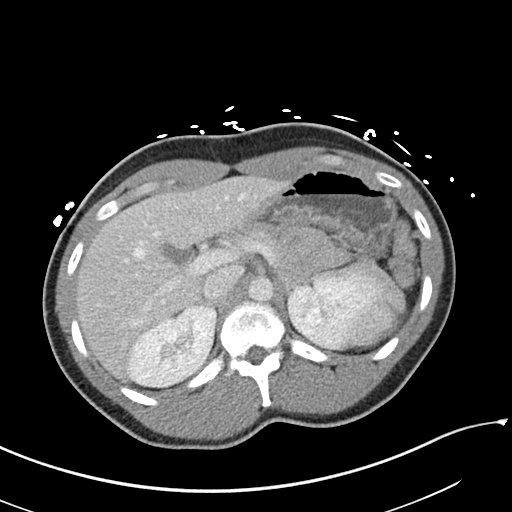
[im 73/86  soft-tissue]
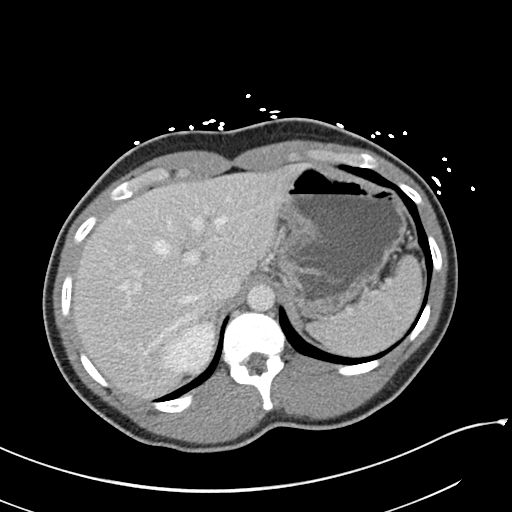
[im 81/86  soft-tissue]
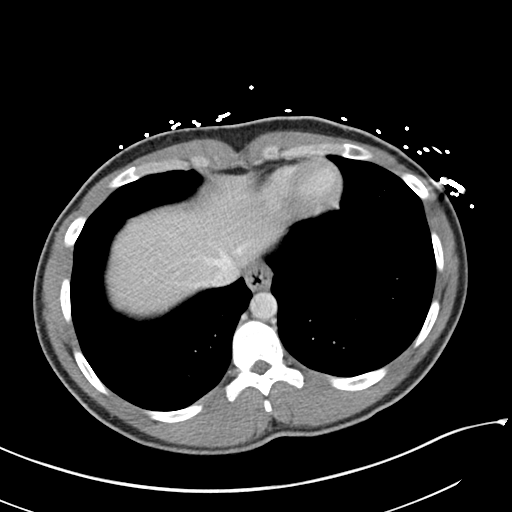

[Series 5: coronal st · coronal · 0.64mm/px · 3 of 129 slices shown]
[im 43/129  soft-tissue]
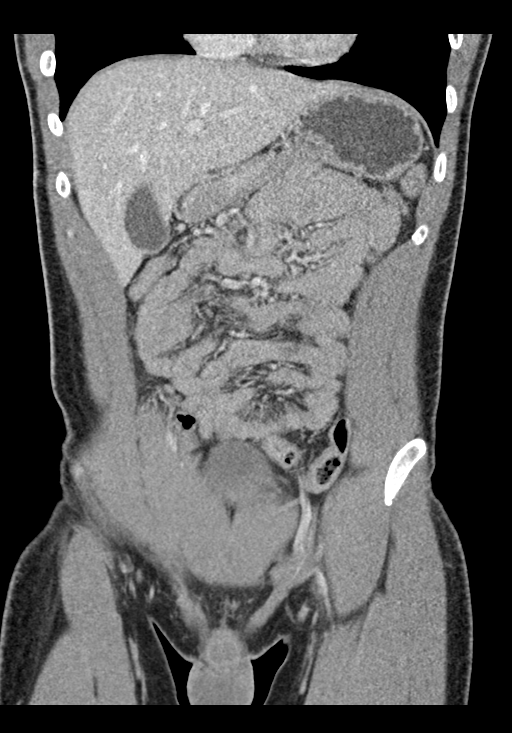
[im 57/129  soft-tissue]
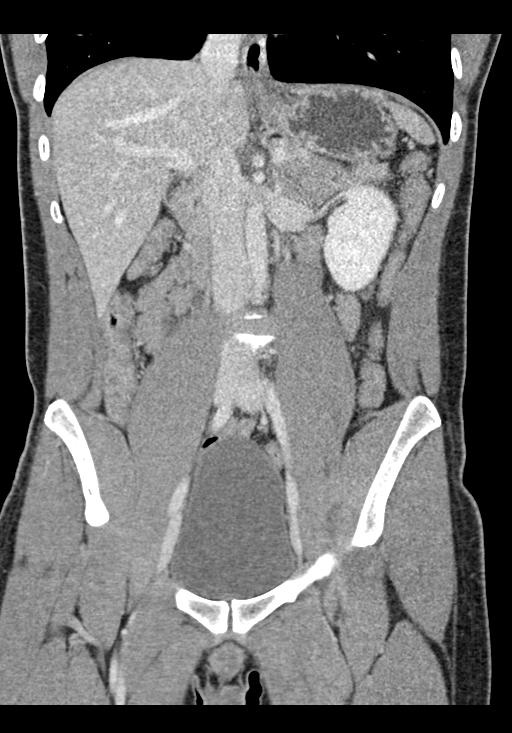
[im 72/129  soft-tissue]
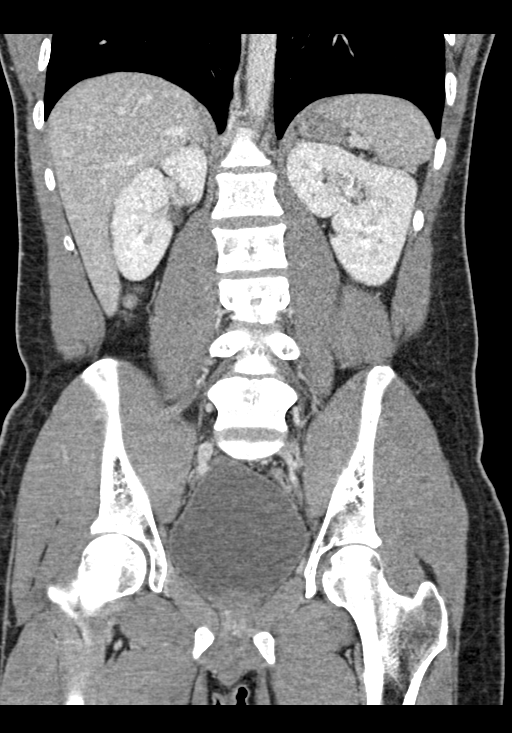

[16 of 46 positions shown; findings below may reference images not displayed]

FINDINGS: Lower chest: The visualized lung bases are clear bilaterally. The
visualized heart and pericardium are unremarkable.

Hepatobiliary: Wedge like region of low attenuation within segment
4B adjacent to the falciform ligament likely represents geographic
fatty hepatic infiltration. The liver is otherwise unremarkable.
Gallbladder unremarkable. No intra or extrahepatic biliary ductal
dilation.

Pancreas: Unremarkable

Spleen: Unremarkable

Adrenals/Urinary Tract: Adrenal glands are unremarkable. Kidneys are
normal, without renal calculi, focal lesion, or hydronephrosis.
Bladder is unremarkable.

Stomach/Bowel: Stomach is within normal limits. Appendix appears
normal. No evidence of bowel wall thickening, distention, or
inflammatory changes. No free intraperitoneal gas or fluid.

Vascular/Lymphatic: No significant vascular findings are present. No
enlarged abdominal or pelvic lymph nodes.

Reproductive: Prostate is unremarkable.

Other: No abdominal wall hernia.

Musculoskeletal: No acute bone abnormality. No lytic or blastic bone
lesion.
IMPRESSION: No acute intra-abdominal pathology identified. No definite
radiographic explanation for the patient's reported symptoms.

## 2023-05-25 ENCOUNTER — Encounter (HOSPITAL_COMMUNITY): Payer: Self-pay

## 2023-05-25 ENCOUNTER — Other Ambulatory Visit: Payer: Self-pay

## 2023-05-25 ENCOUNTER — Inpatient Hospital Stay (HOSPITAL_COMMUNITY)
Admission: EM | Admit: 2023-05-25 | Discharge: 2023-05-27 | DRG: 638 | Discharge status: Against Medical Advice | Payer: MEDICAID | Attending: Family Medicine | Admitting: Family Medicine

## 2023-05-25 ENCOUNTER — Emergency Department (HOSPITAL_COMMUNITY): Payer: MEDICAID

## 2023-05-25 DIAGNOSIS — E871 Hypo-osmolality and hyponatremia: Secondary | ICD-10-CM | POA: Diagnosis present

## 2023-05-25 DIAGNOSIS — E1010 Type 1 diabetes mellitus with ketoacidosis without coma: Principal | ICD-10-CM | POA: Diagnosis present

## 2023-05-25 DIAGNOSIS — R9431 Abnormal electrocardiogram [ECG] [EKG]: Secondary | ICD-10-CM | POA: Diagnosis present

## 2023-05-25 DIAGNOSIS — K3184 Gastroparesis: Secondary | ICD-10-CM | POA: Diagnosis present

## 2023-05-25 DIAGNOSIS — E1043 Type 1 diabetes mellitus with diabetic autonomic (poly)neuropathy: Secondary | ICD-10-CM | POA: Diagnosis present

## 2023-05-25 DIAGNOSIS — I10 Essential (primary) hypertension: Secondary | ICD-10-CM | POA: Insufficient documentation

## 2023-05-25 DIAGNOSIS — E109 Type 1 diabetes mellitus without complications: Secondary | ICD-10-CM | POA: Insufficient documentation

## 2023-05-25 DIAGNOSIS — Z5329 Procedure and treatment not carried out because of patient's decision for other reasons: Secondary | ICD-10-CM | POA: Diagnosis present

## 2023-05-25 DIAGNOSIS — Z9641 Presence of insulin pump (external) (internal): Secondary | ICD-10-CM | POA: Diagnosis present

## 2023-05-25 DIAGNOSIS — R7989 Other specified abnormal findings of blood chemistry: Secondary | ICD-10-CM | POA: Diagnosis present

## 2023-05-25 DIAGNOSIS — R1115 Cyclical vomiting syndrome unrelated to migraine: Secondary | ICD-10-CM | POA: Insufficient documentation

## 2023-05-25 DIAGNOSIS — E876 Hypokalemia: Secondary | ICD-10-CM | POA: Diagnosis present

## 2023-05-25 DIAGNOSIS — E1143 Type 2 diabetes mellitus with diabetic autonomic (poly)neuropathy: Secondary | ICD-10-CM | POA: Insufficient documentation

## 2023-05-25 DIAGNOSIS — N179 Acute kidney failure, unspecified: Secondary | ICD-10-CM | POA: Insufficient documentation

## 2023-05-25 DIAGNOSIS — R000 Tachycardia, unspecified: Secondary | ICD-10-CM | POA: Diagnosis present

## 2023-05-25 DIAGNOSIS — Z794 Long term (current) use of insulin: Secondary | ICD-10-CM

## 2023-05-25 DIAGNOSIS — D72829 Elevated white blood cell count, unspecified: Secondary | ICD-10-CM | POA: Insufficient documentation

## 2023-05-25 DIAGNOSIS — E860 Dehydration: Secondary | ICD-10-CM | POA: Diagnosis present

## 2023-05-25 LAB — CBG MONITORING, ED
Glucose-Capillary: 451 mg/dL — ABNORMAL HIGH (ref 70–99)
Glucose-Capillary: 480 mg/dL — ABNORMAL HIGH (ref 70–99)
Glucose-Capillary: 469 mg/dL — ABNORMAL HIGH (ref 70–99)
Glucose-Capillary: 494 mg/dL — ABNORMAL HIGH (ref 70–99)
Glucose-Capillary: 533 mg/dL (ref 70–99)
Glucose-Capillary: 390 mg/dL — ABNORMAL HIGH (ref 70–99)
Glucose-Capillary: 341 mg/dL — ABNORMAL HIGH (ref 70–99)
Glucose-Capillary: 266 mg/dL — ABNORMAL HIGH (ref 70–99)
Glucose-Capillary: 218 mg/dL — ABNORMAL HIGH (ref 70–99)
Glucose-Capillary: 168 mg/dL — ABNORMAL HIGH (ref 70–99)
Glucose-Capillary: 175 mg/dL — ABNORMAL HIGH (ref 70–99)
Glucose-Capillary: 203 mg/dL — ABNORMAL HIGH (ref 70–99)
Glucose-Capillary: 205 mg/dL — ABNORMAL HIGH (ref 70–99)
Glucose-Capillary: 203 mg/dL — ABNORMAL HIGH (ref 70–99)
Glucose-Capillary: 183 mg/dL — ABNORMAL HIGH (ref 70–99)
Glucose-Capillary: 182 mg/dL — ABNORMAL HIGH (ref 70–99)
Glucose-Capillary: 161 mg/dL — ABNORMAL HIGH (ref 70–99)

## 2023-05-25 LAB — URINALYSIS, ROUTINE W REFLEX MICROSCOPIC
Bacteria, UA: NONE SEEN
Bilirubin Urine: NEGATIVE
Glucose, UA: >=500 mg/dL — AB
Ketones, ur: 80 mg/dL — AB
Leukocytes,Ua: NEGATIVE
Nitrite: NEGATIVE
Protein, ur: NEGATIVE mg/dL
Specific Gravity, Urine: 1.015 (ref 1.005–1.030)
pH: 5 (ref 5.0–8.0)

## 2023-05-25 LAB — COMPREHENSIVE METABOLIC PANEL
ALT: 25 U/L (ref 0–44)
AST: 34 U/L (ref 15–41)
Albumin: 4.3 g/dL (ref 3.5–5.0)
Alkaline Phosphatase: 77 U/L (ref 38–126)
Anion gap: 28 — ABNORMAL HIGH (ref 5–15)
BUN: 18 mg/dL (ref 6–20)
CO2: 11 mmol/L — ABNORMAL LOW (ref 22–32)
Calcium: 9.2 mg/dL (ref 8.9–10.3)
Chloride: 93 mmol/L — ABNORMAL LOW (ref 98–111)
Creatinine, Ser: 1.39 mg/dL — ABNORMAL HIGH (ref 0.61–1.24)
GFR, Estimated: >60 mL/min (ref >60)
Glucose, Bld: 484 mg/dL — ABNORMAL HIGH (ref 70–99)
Potassium: 4.1 mmol/L (ref 3.5–5.1)
Sodium: 132 mmol/L — ABNORMAL LOW (ref 135–145)
Total Bilirubin: 2.2 mg/dL — ABNORMAL HIGH (ref 0.0–1.2)
Total Protein: 8 g/dL (ref 6.5–8.1)

## 2023-05-25 LAB — BLOOD GAS, ARTERIAL
Acid-base deficit: 18.5 mmol/L — ABNORMAL HIGH (ref 0.0–2.0)
Bicarbonate: 7.4 mmol/L — ABNORMAL LOW (ref 20.0–28.0)
Drawn by: 22563
FIO2: 21 %
O2 Saturation: 99.7 %
Patient temperature: 37
RATE: 18 {breaths}/min
pCO2 arterial: 19 mmHg — CL (ref 32–48)
pH, Arterial: 7.2 — ABNORMAL LOW (ref 7.35–7.45)
pO2, Arterial: 110 mmHg — ABNORMAL HIGH (ref 83–108)

## 2023-05-25 LAB — CBC WITH DIFFERENTIAL/PLATELET
Abs Immature Granulocytes: 0.08 10*3/uL — ABNORMAL HIGH (ref 0.00–0.07)
Basophils Absolute: 0.1 10*3/uL (ref 0.0–0.1)
Basophils Relative: 0 %
Eosinophils Absolute: 0 10*3/uL (ref 0.0–0.5)
Eosinophils Relative: 0 %
HCT: 42.9 % (ref 39.0–52.0)
Hemoglobin: 14.8 g/dL (ref 13.0–17.0)
Immature Granulocytes: 1 %
Lymphocytes Relative: 16 %
Lymphs Abs: 2.6 10*3/uL (ref 0.7–4.0)
MCH: 30.4 pg (ref 26.0–34.0)
MCHC: 34.5 g/dL (ref 30.0–36.0)
MCV: 88.1 fL (ref 80.0–100.0)
Monocytes Absolute: 0.6 10*3/uL (ref 0.1–1.0)
Monocytes Relative: 4 %
Neutro Abs: 12.5 10*3/uL — ABNORMAL HIGH (ref 1.7–7.7)
Neutrophils Relative %: 79 %
Platelets: 275 10*3/uL (ref 150–400)
RBC: 4.87 MIL/uL (ref 4.22–5.81)
RDW: 12.9 % (ref 11.5–15.5)
WBC: 15.9 10*3/uL — ABNORMAL HIGH (ref 4.0–10.5)
nRBC: 0 % (ref 0.0–0.2)

## 2023-05-25 LAB — BASIC METABOLIC PANEL
Anion gap: 20 — ABNORMAL HIGH (ref 5–15)
Anion gap: 14 (ref 5–15)
Anion gap: 11 (ref 5–15)
Anion gap: 14 (ref 5–15)
BUN: 19 mg/dL (ref 6–20)
BUN: 13 mg/dL (ref 6–20)
BUN: 8 mg/dL (ref 6–20)
BUN: 7 mg/dL (ref 6–20)
CO2: 10 mmol/L — ABNORMAL LOW (ref 22–32)
CO2: 14 mmol/L — ABNORMAL LOW (ref 22–32)
CO2: 18 mmol/L — ABNORMAL LOW (ref 22–32)
CO2: 17 mmol/L — ABNORMAL LOW (ref 22–32)
Calcium: 8.3 mg/dL — ABNORMAL LOW (ref 8.9–10.3)
Calcium: 8.6 mg/dL — ABNORMAL LOW (ref 8.9–10.3)
Calcium: 8.4 mg/dL — ABNORMAL LOW (ref 8.9–10.3)
Calcium: 8.7 mg/dL — ABNORMAL LOW (ref 8.9–10.3)
Chloride: 103 mmol/L (ref 98–111)
Chloride: 112 mmol/L — ABNORMAL HIGH (ref 98–111)
Chloride: 108 mmol/L (ref 98–111)
Chloride: 105 mmol/L (ref 98–111)
Creatinine, Ser: 1.66 mg/dL — ABNORMAL HIGH (ref 0.61–1.24)
Creatinine, Ser: 1.11 mg/dL (ref 0.61–1.24)
Creatinine, Ser: 0.91 mg/dL (ref 0.61–1.24)
Creatinine, Ser: 0.87 mg/dL (ref 0.61–1.24)
GFR, Estimated: 58 mL/min — ABNORMAL LOW (ref >60)
GFR, Estimated: >60 mL/min (ref >60)
GFR, Estimated: >60 mL/min (ref >60)
GFR, Estimated: >60 mL/min (ref >60)
Glucose, Bld: 353 mg/dL — ABNORMAL HIGH (ref 70–99)
Glucose, Bld: 195 mg/dL — ABNORMAL HIGH (ref 70–99)
Glucose, Bld: 184 mg/dL — ABNORMAL HIGH (ref 70–99)
Glucose, Bld: 190 mg/dL — ABNORMAL HIGH (ref 70–99)
Potassium: 4.1 mmol/L (ref 3.5–5.1)
Potassium: 3.8 mmol/L (ref 3.5–5.1)
Potassium: 3.5 mmol/L (ref 3.5–5.1)
Potassium: 3.5 mmol/L (ref 3.5–5.1)
Sodium: 133 mmol/L — ABNORMAL LOW (ref 135–145)
Sodium: 140 mmol/L (ref 135–145)
Sodium: 137 mmol/L (ref 135–145)
Sodium: 136 mmol/L (ref 135–145)

## 2023-05-25 LAB — GLUCOSE, CAPILLARY
Glucose-Capillary: 171 mg/dL — ABNORMAL HIGH (ref 70–99)
Glucose-Capillary: 184 mg/dL — ABNORMAL HIGH (ref 70–99)
Glucose-Capillary: 199 mg/dL — ABNORMAL HIGH (ref 70–99)
Glucose-Capillary: 196 mg/dL — ABNORMAL HIGH (ref 70–99)

## 2023-05-25 LAB — LACTIC ACID, PLASMA
Lactic Acid, Venous: 6.9 mmol/L (ref 0.5–1.9)
Lactic Acid, Venous: 3.2 mmol/L (ref 0.5–1.9)

## 2023-05-25 LAB — RAPID URINE DRUG SCREEN, HOSP PERFORMED
Amphetamines: NOT DETECTED
Barbiturates: NOT DETECTED
Benzodiazepines: NOT DETECTED
Cocaine: NOT DETECTED
Opiates: NOT DETECTED
Tetrahydrocannabinol: NOT DETECTED

## 2023-05-25 LAB — BETA-HYDROXYBUTYRIC ACID
Beta-Hydroxybutyric Acid: 7.39 mmol/L — ABNORMAL HIGH (ref 0.05–0.27)
Beta-Hydroxybutyric Acid: 6.91 mmol/L — ABNORMAL HIGH (ref 0.05–0.27)
Beta-Hydroxybutyric Acid: 4.02 mmol/L — ABNORMAL HIGH (ref 0.05–0.27)

## 2023-05-25 LAB — ETHANOL: Alcohol, Ethyl (B): <10 mg/dL (ref <10)

## 2023-05-25 LAB — LIPASE, BLOOD: Lipase: 26 U/L (ref 11–51)

## 2023-05-25 LAB — MRSA NEXT GEN BY PCR, NASAL: MRSA by PCR Next Gen: NOT DETECTED

## 2023-05-25 MED ORDER — SODIUM CHLORIDE 0.9 % IV BOLUS
1000.0000 mL | Freq: Once | INTRAVENOUS | Status: AC
Start: 2023-05-25 — End: 2023-05-25
  Administered 2023-05-25: 1000 mL via INTRAVENOUS

## 2023-05-25 MED ORDER — LABETALOL HCL 5 MG/ML IV SOLN
10.0000 mg | INTRAVENOUS | Status: AC | PRN
Start: 2023-05-25 — End: 2023-05-26
  Administered 2023-05-25 – 2023-05-26 (×3): 10 mg via INTRAVENOUS
  Filled 2023-05-25 (×2): qty 4

## 2023-05-25 MED ORDER — ONDANSETRON HCL 4 MG/2ML IJ SOLN
4.0000 mg | Freq: Four times a day (QID) | INTRAMUSCULAR | Status: DC | PRN
Start: 2023-05-25 — End: 2023-05-25
  Administered 2023-05-25: 4 mg via INTRAVENOUS
  Filled 2023-05-25: qty 2

## 2023-05-25 MED ORDER — LABETALOL HCL 5 MG/ML IV SOLN: 10.0000 mg | Freq: Once | INTRAVENOUS | Status: DC

## 2023-05-25 MED ORDER — SODIUM CHLORIDE 0.9 % IV SOLN: INTRAVENOUS | Status: AC

## 2023-05-25 MED ORDER — PROMETHAZINE (PHENERGAN) 6.25MG IN NS 50ML IVPB
6.2500 mg | Freq: Four times a day (QID) | INTRAVENOUS | Status: DC | PRN
  Administered 2023-05-25: 6.25 mg via INTRAVENOUS
  Filled 2023-05-25 (×2): qty 50
  Filled 2023-05-25: qty 6.25

## 2023-05-25 MED ORDER — SODIUM CHLORIDE 0.9 % IV BOLUS
1000.0000 mL | Freq: Once | INTRAVENOUS | Status: AC
  Administered 2023-05-25: 1000 mL via INTRAVENOUS

## 2023-05-25 MED ORDER — TRAZODONE HCL 50 MG PO TABS
25.0000 mg | ORAL_TABLET | Freq: Every evening | ORAL | Status: DC | PRN
  Administered 2023-05-27: 25 mg via ORAL
  Filled 2023-05-25: qty 1

## 2023-05-25 MED ORDER — METOCLOPRAMIDE HCL 5 MG/ML IJ SOLN
10.0000 mg | Freq: Four times a day (QID) | INTRAMUSCULAR | Status: DC | PRN
  Administered 2023-05-25: 10 mg via INTRAVENOUS
  Filled 2023-05-25: qty 2

## 2023-05-25 MED ORDER — METOCLOPRAMIDE HCL 5 MG/ML IJ SOLN
10.0000 mg | Freq: Four times a day (QID) | INTRAMUSCULAR | Status: DC | PRN
  Administered 2023-05-25 – 2023-05-27 (×4): 10 mg via INTRAVENOUS
  Filled 2023-05-25 (×4): qty 2

## 2023-05-25 MED ORDER — HYDRALAZINE HCL 20 MG/ML IJ SOLN
10.0000 mg | Freq: Four times a day (QID) | INTRAMUSCULAR | Status: AC | PRN
  Administered 2023-05-25 – 2023-05-26 (×2): 10 mg via INTRAVENOUS
  Filled 2023-05-25 (×2): qty 1

## 2023-05-25 MED ORDER — METOCLOPRAMIDE HCL 5 MG/ML IJ SOLN
10.0000 mg | Freq: Once | INTRAMUSCULAR | Status: AC
  Administered 2023-05-25: 10 mg via INTRAVENOUS
  Filled 2023-05-25: qty 2

## 2023-05-25 MED ORDER — ALBUTEROL SULFATE (2.5 MG/3ML) 0.083% IN NEBU: 2.5000 mg | INHALATION_SOLUTION | RESPIRATORY_TRACT | Status: DC | PRN

## 2023-05-25 MED ORDER — PANTOPRAZOLE SODIUM 40 MG IV SOLR
40.0000 mg | Freq: Once | INTRAVENOUS | Status: AC
  Administered 2023-05-25: 40 mg via INTRAVENOUS
  Filled 2023-05-25: qty 10

## 2023-05-25 MED ORDER — DEXTROSE 50 % IV SOLN: 0.0000 mL | INTRAVENOUS | Status: DC | PRN

## 2023-05-25 MED ORDER — CHLORHEXIDINE GLUCONATE CLOTH 2 % EX PADS
6.0000 | MEDICATED_PAD | Freq: Every day | CUTANEOUS | Status: DC
  Administered 2023-05-25 – 2023-05-26 (×2): 6 via TOPICAL

## 2023-05-25 MED ORDER — ALUM & MAG HYDROXIDE-SIMETH 200-200-20 MG/5ML PO SUSP
30.0000 mL | ORAL | Status: DC | PRN
  Administered 2023-05-25: 30 mL via ORAL
  Filled 2023-05-25 (×2): qty 30

## 2023-05-25 MED ORDER — DEXTROSE-SODIUM CHLORIDE 5-0.9 % IV SOLN: INTRAVENOUS | Status: AC

## 2023-05-25 MED ORDER — DEXTROSE IN LACTATED RINGERS 5 % IV SOLN: INTRAVENOUS | Status: DC

## 2023-05-25 MED ORDER — LORAZEPAM 2 MG/ML IJ SOLN: 1.0000 mg | INTRAMUSCULAR | Status: DC | PRN

## 2023-05-25 MED ORDER — IOHEXOL 300 MG/ML  SOLN
100.0000 mL | Freq: Once | INTRAMUSCULAR | Status: AC | PRN
  Administered 2023-05-25: 100 mL via INTRAVENOUS

## 2023-05-25 MED ORDER — ACETAMINOPHEN 325 MG PO TABS: 650.0000 mg | ORAL_TABLET | Freq: Four times a day (QID) | ORAL | Status: DC | PRN

## 2023-05-25 MED ORDER — ACETAMINOPHEN 650 MG RE SUPP: 650.0000 mg | Freq: Four times a day (QID) | RECTAL | Status: DC | PRN

## 2023-05-25 MED ORDER — ONDANSETRON HCL 4 MG PO TABS: 4.0000 mg | ORAL_TABLET | Freq: Four times a day (QID) | ORAL | Status: DC | PRN

## 2023-05-25 MED ORDER — ENOXAPARIN SODIUM 40 MG/0.4ML IJ SOSY
40.0000 mg | PREFILLED_SYRINGE | INTRAMUSCULAR | Status: DC
  Administered 2023-05-25 – 2023-05-26 (×2): 40 mg via SUBCUTANEOUS
  Filled 2023-05-25 (×2): qty 0.4

## 2023-05-25 MED ORDER — POTASSIUM CHLORIDE 10 MEQ/100ML IV SOLN
10.0000 meq | INTRAVENOUS | Status: AC
  Administered 2023-05-25 (×2): 10 meq via INTRAVENOUS
  Filled 2023-05-25 (×2): qty 100

## 2023-05-25 MED ORDER — INSULIN REGULAR(HUMAN) IN NACL 100-0.9 UT/100ML-% IV SOLN
INTRAVENOUS | Status: DC
  Administered 2023-05-25: 8.5 [IU]/h via INTRAVENOUS
  Administered 2023-05-25: 3.2 [IU]/h via INTRAVENOUS
  Filled 2023-05-25 (×2): qty 100

## 2023-05-25 NOTE — ED Provider Notes (Addendum)
 [place] [facility]ERGENCY DEPARTMENT AT [facility] Provider Note  CSN: [id number] Arrival date & time: 05/25/23 9973  Chief Complaint(s) Loss of Consciousness  HPI Mason Mclaughlin is a 28 y.o. male with a history of type 1 diabetes, gastroparesis here for 3 days of generalized abdominal pain worse in the left upper quadrant with nausea and nonbloody nonbilious emesis and decreased oral tolerance.  Patient denies any EtOH use.  No marijuana use.  No known sick contacts.  Reports that his CBGs have been in the 200s.  While checking in, patient had a bout of emesis resulting in syncope.  He was brought back immediately to a treatment room.  HPI  Past Medical History History reviewed. No pertinent past medical history. There are no active problems to display for this patient.  Home Medication(s) Prior to Admission medications   Medication Sig Start Date End Date Taking? Authorizing Provider  NOVOLOG  RELION 100 UNIT/ML injection Inject 75 Units into the skin daily at 6 (six) AM. 05/08/23  Yes [provider]  ondansetron  (ZOFRAN -ODT) 4 MG disintegrating tablet Take 4 mg by mouth every 8 (eight) hours as needed for nausea or vomiting. 05/23/23  Yes [provider]  pantoprazole  (PROTONIX ) 40 MG tablet Take 40 mg by mouth daily. 05/08/23  Yes [provider]                                                                                                                                    Allergies Patient has no allergy information on record.  Review of Systems Review of Systems As noted in HPI  Physical Exam Vital Signs  I have reviewed the triage vital signs BP 123/60 (BP Location: Left Arm)   Pulse (!) 129   Temp 98.8 F (37.1 C) (Oral)   Resp 18   Ht 5' 5 (1.651 m)   Wt 72.6 kg   SpO2 100%   BMI 26.63 kg/m   Physical Exam Vitals reviewed.  Constitutional:      General: He is not in acute distress.    Appearance: He is  well-developed. He is ill-appearing and diaphoretic.  HENT:     Head: Normocephalic and atraumatic.     Nose: Nose normal.  Eyes:     General: No scleral icterus.       Right eye: No discharge.        Left eye: No discharge.     Conjunctiva/sclera: Conjunctivae normal.     Pupils: Pupils are equal, round, and reactive to light.  Cardiovascular:     Rate and Rhythm: Regular rhythm. Tachycardia present.     Heart sounds: No murmur heard.    No friction rub. No gallop.  Pulmonary:     Effort: Pulmonary effort is normal. No respiratory distress.     Breath sounds: Normal breath sounds. No stridor. No rales.  Abdominal:     General: There is  no distension.     Palpations: Abdomen is soft.     Tenderness: There is generalized abdominal tenderness. There is no guarding or rebound.  Musculoskeletal:        General: No tenderness.     Cervical back: Normal range of motion and neck supple.  Skin:    General: Skin is warm.     Findings: No erythema or rash.  Neurological:     Mental Status: He is lethargic.     ED Results and Treatments Labs (all labs ordered are listed, but only abnormal results are displayed) Labs Reviewed  BETA-HYDROXYBUTYRIC ACID - Abnormal; Notable for the following components:      Result Value   Beta-Hydroxybutyric Acid 7.39 (*)    All other components within normal limits  CBC WITH DIFFERENTIAL/PLATELET - Abnormal; Notable for the following components:   WBC 15.9 (*)    Neutro Abs 12.5 (*)    Abs Immature Granulocytes 0.08 (*)    All other components within normal limits  URINALYSIS, ROUTINE W REFLEX MICROSCOPIC - Abnormal; Notable for the following components:   Color, Urine COLORLESS (*)    Glucose, UA >=500 (*)    Hgb urine dipstick SMALL (*)    Ketones, ur 80 (*)    All other components within normal limits  COMPREHENSIVE METABOLIC PANEL - Abnormal; Notable for the following components:   Sodium 132 (*)    Chloride 93 (*)    CO2 11 (*)     Glucose, Bld 484 (*)    Creatinine, Ser 1.39 (*)    Total Bilirubin 2.2 (*)    Anion gap 28 (*)    All other components within normal limits  LACTIC ACID, PLASMA - Abnormal; Notable for the following components:   Lactic Acid, Venous 6.9 (*)    All other components within normal limits  BLOOD GAS, ARTERIAL - Abnormal; Notable for the following components:   pH, Arterial 7.2 (*)    pCO2 arterial 19 (*)    pO2, Arterial 110 (*)    Bicarbonate 7.4 (*)    Acid-base deficit 18.5 (*)    All other components within normal limits  CBG MONITORING, ED - Abnormal; Notable for the following components:   Glucose-Capillary 451 (*)    All other components within normal limits  CBG MONITORING, ED - Abnormal; Notable for the following components:   Glucose-Capillary 480 (*)    All other components within normal limits  CBG MONITORING, ED - Abnormal; Notable for the following components:   Glucose-Capillary 469 (*)    All other components within normal limits  CBG MONITORING, ED - Abnormal; Notable for the following components:   Glucose-Capillary 494 (*)    All other components within normal limits  CBG MONITORING, ED - Abnormal; Notable for the following components:   Glucose-Capillary 533 (*)    All other components within normal limits  CBG MONITORING, ED - Abnormal; Notable for the following components:   Glucose-Capillary 390 (*)    All other components within normal limits  CBG MONITORING, ED - Abnormal; Notable for the following components:   Glucose-Capillary 341 (*)    All other components within normal limits  LIPASE, BLOOD  RAPID URINE DRUG SCREEN, HOSP PERFORMED  ETHANOL  BETA-HYDROXYBUTYRIC ACID  BETA-HYDROXYBUTYRIC ACID  BLOOD GAS, VENOUS  LACTIC ACID, PLASMA  EKG  EKG Interpretation Date/Time:  Wednesday May 25 2023 00:49:05 EST Ventricular Rate:   123 PR Interval:  77 QRS Duration:  86 QT Interval:  347 QTC Calculation: 497 R Axis:   58  Text Interpretation: Sinus tachycardia Nonspecific T abnormalities, inferior leads Prolonged QT interval Otherwise no significant change when compared to prior tracings on merged chart Confirmed by Trine Likes [phone]) on 05/25/2023 12:56:05 AM       Radiology CT ABDOMEN PELVIS W CONTRAST Result Date: 05/25/2023 CLINICAL DATA:  Acute abdominal pain, nausea and vomiting for 3 days. EXAM: CT ABDOMEN AND PELVIS WITH CONTRAST TECHNIQUE: Multidetector CT imaging of the abdomen and pelvis was performed using the standard protocol following bolus administration of intravenous contrast. RADIATION DOSE REDUCTION: This exam was performed according to the departmental dose-optimization program which includes automated exposure control, adjustment of the mA and/or kV according to patient size and/or use of iterative reconstruction technique. CONTRAST:  OMNIPAQUE  IOHEXOL  300 MG/ML  SOLN COMPARISON:  CT abdomen and pelvis dated 01/16/2022. FINDINGS: Lower chest: A 6 mm ground-glass pulmonary nodule in the left lower lobe is not significantly changed since 09/11/2020 and presumed benign given long-term stability. No imaging follow-up is recommended for this finding. Hepatobiliary: A geographic area of hypoattenuation near the gallbladder fossa/falciform ligament likely reflects focal fat. No gallstones, gallbladder wall thickening, or biliary dilatation. Pancreas: Unremarkable. No pancreatic ductal dilatation or surrounding inflammatory changes. Spleen: Normal in size without focal abnormality. Adrenals/Urinary Tract: Adrenal glands are unremarkable. Kidneys are normal, without renal calculi, focal lesion, or hydronephrosis. Bladder is unremarkable. Stomach/Bowel: Stomach is within normal limits. Appendix appears normal. No evidence of bowel wall thickening, distention, or inflammatory changes. Vascular/Lymphatic: No  significant vascular findings are present. No enlarged abdominal or pelvic lymph nodes. Reproductive: Prostate is unremarkable. Other: No abdominal wall hernia or abnormality. No abdominopelvic ascites. Musculoskeletal: No acute or significant osseous findings. IMPRESSION: No acute findings in the abdomen or pelvis. Electronically Signed   By: Norman Hopper M.D.   On: 05/25/2023 08:05    Medications Ordered in ED Medications  insulin  regular, human (MYXREDLIN ) 100 units/ 100 mL infusion (8.5 Units/hr Intravenous Rate/Dose Change 05/25/23 0753)  dextrose  5 % in lactated ringers  infusion (0 mLs Intravenous Hold 05/25/23 0714)  dextrose  50 % solution 0-50 mL (has no administration in time range)  0.9 %  sodium chloride  infusion ( Intravenous New Bag/Given 05/25/23 0456)  sodium chloride  0.9 % bolus 1,000 mL (0 mLs Intravenous Stopped 05/25/23 0306)  metoCLOPramide  (REGLAN ) injection 10 mg (10 mg Intravenous Given 05/25/23 0224)  pantoprazole  (PROTONIX ) injection 40 mg (40 mg Intravenous Given 05/25/23 0224)  potassium chloride  10 mEq in 100 mL IVPB (0 mEq Intravenous Stopped 05/25/23 0506)  sodium chloride  0.9 % bolus 1,000 mL (0 mLs Intravenous Stopped 05/25/23 0455)  iohexol  (OMNIPAQUE ) 300 MG/ML solution 100 mL (100 mLs Intravenous Contrast Given 05/25/23 0631)   Procedures .Critical Care  Performed by: Trine Likes Moder, MD Authorized by: Trine Likes Moder, MD   Critical care provider statement:    Critical care time (minutes):  67   Critical care time was exclusive of:  Separately billable procedures and treating other patients   Critical care was necessary to treat or prevent imminent or life-threatening deterioration of the following conditions:  Metabolic crisis and endocrine crisis   Critical care was time spent personally by me on the following activities:  Development of treatment plan with patient or surrogate, discussions with consultants, evaluation of patient's response to treatment,  examination of patient, obtaining history from patient or surrogate, review of old charts, re-evaluation of patient's condition, pulse oximetry, ordering and review of radiographic studies, ordering and review of laboratory studies and ordering and performing treatments and interventions   (including critical care time) Medical Decision Making / ED Course   Medical Decision Making Amount and/or Complexity of Data Reviewed Labs: ordered. Decision-making details documented in ED Course. Radiology: ordered and independent interpretation performed. Decision-making details documented in ED Course. ECG/medicine tests: ordered and independent interpretation performed. Decision-making details documented in ED Course.  Risk Prescription drug management. Drug therapy requiring intensive monitoring for toxicity. Decision regarding hospitalization.    Workup is consistent with diabetic ketoacidosis.  Also noted to have elevated lactic acid likely from dehydration from GI losses - doubt sepsis at this time.  Patient started on IV fluids and insulin  drip.  UDS and EtOH negative.  Possible viral gastroenteritis versus gastroparesis flare. UA w/o  infection.  CT scan on my evaluation without significant intra-abdominal inflammatory/infectious process or bowel obstruction noted.  Awaiting formal radiology read.  Plan to repeat lactic acid.  If clearing, anticipate stepdown admission.  CT negative.   Patient care turned over to oncoming provider. Patient case and results discussed in detail; please see their note for further ED managment.        Final Clinical Impression(s) / ED Diagnoses Final diagnoses:  Diabetic ketoacidosis without coma associated with type 1 diabetes mellitus (HCC)    This chart was dictated using voice recognition software.  Despite best efforts to proofread,  errors can occur which can change the documentation meaning.    Trine Raynell Moder, MD 05/25/23 [phone]

## 2023-05-25 NOTE — ED Notes (Signed)
 RT note: ABG obtained in [id number], labelled/sent to Lab.

## 2023-05-25 NOTE — ED Notes (Signed)
CBG 341. 

## 2023-05-25 NOTE — ED Notes (Signed)
EDP at bedside to assess pt 

## 2023-05-25 NOTE — ED Notes (Signed)
 Pt to CT

## 2023-05-25 NOTE — Inpatient Diabetes Management (Signed)
 Inpatient Diabetes Program Recommendations  AACE/ADA: New Consensus Statement on Inpatient Glycemic Control (2015)  Target Ranges:  Prepandial:   less than 140 mg/dL      Peak postprandial:   less than 180 mg/dL (1-2 hours)      Critically ill patients:  140 - 180 mg/dL   Lab Results  Component Value Date   GLUCAP 168 (H) 05/25/2023    Review of Glycemic Control  Diabetes history: DM1 Outpatient Diabetes medications: Novolog  ReliOn 75 units at 0600 daily (insulin  pump) Current orders for Inpatient glycemic control: IV insulin  per Endo[facility] for DKA Transitioned to Semglee  5 units Q24H, Novolog  0-9 TID and 0-5 HS  CBG 338 this am. Endo - at [facility], Dr Lucienne Uses Dexcom CGM  Total basal - 33.25 units Total bolus - 1:5 CHO ratio and 1 unit drops BG 30 mg/dL     Inpatient Diabetes Program Recommendations:    Increase Semglee  to 15 units BID  Add Novolog  8 units TID with meals if eating > 50%  Pt to call pump company to do quality control check prior to putting pump back on after discharge. Instructed to wait 24H from time basal insulin  was given, to restart basal portion on pump.   Spoke with pt on 1/2 regarding his diabetes control and admission for DKA. This is 3rd admission in past 5 months. Hx gastroparesis. Pt admits to not taking care of himself. Has had some problems getting Dexcom with insurance. Will give samples prior to discharge.  xplained how hyperglycemia leads to damage within blood vessels which lead to the common complications seen with uncontrolled diabetes. Stressed to the patient the importance of improving glycemic control to prevent further complications from uncontrolled diabetes. Discussed impact of nutrition, exercise, stress, sickness, and medications on diabetes control.    Will see pt and give Dexcom samples prior to discharge.   Thank you. Shona Brandy, RD, LDN, CDCES Inpatient Diabetes Coordinator [phone]

## 2023-05-25 NOTE — ED Provider Notes (Signed)
  Physical Exam  BP 123/60 (BP Location: Left Arm)   Pulse (!) 129   Temp 98.8 F (37.1 C) (Oral)   Resp 18   Ht 1.651 m (5' 5)   Wt 72.6 kg   SpO2 100%   BMI 26.63 kg/m   Physical Exam  Procedures  Procedures  ED Course / MDM     Medical Decision Making Amount and/or Complexity of Data Reviewed Labs: ordered. Radiology: ordered.  Risk Prescription drug management. Decision regarding hospitalization.   28 yo iddm vomiting x 3 days with insulin  pump No etoh or drugs No uti, diarrhea Some luq pain and generalized abdominal ttp Ph7.2, bhb 7 On endo tool Plan admission  Discussed with hospitalist who will see for admission     Levander Houston, MD 05/25/23 [phone]

## 2023-05-25 NOTE — ED Triage Notes (Signed)
 Pt was checking in the for vomiting x 3 days. While checking in he had a syncopal episode falling from standing position. At this time pt is actively vomiting. EDP at bedside

## 2023-05-25 NOTE — Progress Notes (Addendum)
    Patient Name: Mason Mclaughlin           DOB: July 09, 1995  MRN: [id number]      Admission Date: 05/25/2023  Attending Provider: Zella Katha HERO, MD  Primary Diagnosis: DKA, type 1 [facility])   Level of care: Stepdown    CROSS COVER NOTE   Date of Service   05/25/2023   Anthem [name], 28 y.o. male, was admitted on 05/25/2023 for DKA, type 1 (HCC).    HPI/Events of Note   Hypertensive, SBP 200-220 Checked multiple times at different sites. Has no history of hypertention.  A/Ox4, c/o nausea, no CP or other discomfort. No associated distress.     Interventions/ Plan   Relief nausea. Trial hydralazine  and/ or labetalol  to decrease BP.         Kyser Wandel, DNP, ACNPC- AG [facility] [place]

## 2023-05-25 NOTE — H&P (Signed)
 History and Physical  Mason Mclaughlin FMW:[id number] DOB: 1996-04-28 DOA: 05/25/2023  PCP: No primary care provider on file.   Chief Complaint: Vomiting  HPI: Mason Mclaughlin is a 28 y.o. male with medical history significant for type 1 diabetes with insulin  pump, gastroparesis, cyclic vomiting being admitted to the hospital with intractable nausea and vomiting, dehydration, hyperglycemia, and associated diabetic ketoacidosis.  Patient tells me that for the last 10 days or so, he has been having some intermittent nausea with vomiting.  Intermittently has been tolerating p.o. intake, has been trying to stay hydrated, not eating very much but despite this his blood sugar has been rising.  He has been taking some hot showers, and that seems to help his symptoms.  Yesterday, he was hardly able to keep anything down, started to feel more dehydrated and decided to come to the ER for evaluation.  He was having some crampy left upper quadrant abdominal pain, denies fevers, chest pain, emesis is not bloody.  He does intermittently smoke marijuana, but says that he is cut back, last smoked maybe a couple of days ago.  Review of Systems: Please see HPI for pertinent positives and negatives. A complete 10 system review of systems are otherwise negative.  History reviewed. No pertinent past medical history. History reviewed. No pertinent surgical history. Social History:  has no history on file for tobacco use, alcohol use, and drug use.  Not on File  History reviewed. No pertinent family history.   Prior to Admission medications   Medication Sig Start Date End Date Taking? Authorizing Provider  NOVOLOG  RELION 100 UNIT/ML injection Inject 75 Units into the skin daily at 6 (six) AM. 05/08/23  Yes [provider]  ondansetron  (ZOFRAN -ODT) 4 MG disintegrating tablet Take 4 mg by mouth every 8 (eight) hours as needed for nausea or vomiting. 05/23/23  Yes [provider]  pantoprazole  (PROTONIX ) 40 MG tablet Take 40 mg by mouth daily. 05/08/23  Yes [provider]    Physical Exam: BP 137/72   Pulse (!) 129   Temp 98.8 F (37.1 C) (Oral)   Resp 19   Ht 5' 5 (1.651 m)   Wt 72.6 kg   SpO2 99%   BMI 26.63 kg/m  General:  Alert, oriented, calm, in no acute distress, mucous membranes look dry Cardiovascular: Tachycardic and regular, no murmurs or rubs, no peripheral edema  Respiratory: clear to auscultation bilaterally, no wheezes, no crackles  Abdomen: soft, nontender, nondistended, normal bowel tones heard  Skin: dry, no rashes  Musculoskeletal: no joint effusions, normal range of motion  Psychiatric: appropriate affect, normal speech  Neurologic: extraocular muscles intact, clear speech, moving all extremities with intact sensorium         Labs on Admission:  Basic Metabolic Panel: Recent Labs  Lab 05/25/23 0107  NA 132*  K 4.1  CL 93*  CO2 11*  GLUCOSE 484*  BUN 18  CREATININE 1.39*  CALCIUM  9.2   Liver Function Tests: Recent Labs  Lab 05/25/23 0107  AST 34  ALT 25  ALKPHOS 77  BILITOT 2.2*  PROT 8.0  ALBUMIN 4.3   Recent Labs  Lab 05/25/23 0107  LIPASE 26   No results for input(s): AMMONIA in the last 168 hours. CBC: Recent Labs  Lab 05/25/23 0107  WBC 15.9*  NEUTROABS 12.5*  HGB 14.8  HCT 42.9  MCV 88.1  PLT 275   Cardiac Enzymes: No results for input(s): CKTOTAL, CKMB, CKMBINDEX, TROPONINI in  the last 168 hours. BNP (last 3 results) No results for input(s): BNP in the last 8760 hours.  ProBNP (last 3 results) No results for input(s): PROBNP in the last 8760 hours.  CBG: Recent Labs  Lab 05/25/23 0452 05/25/23 0538 05/25/23 0609 05/25/23 0703 05/25/23 0742  GLUCAP 469* 494* 533* 390* 341*    Radiological Exams on Admission: CT ABDOMEN PELVIS W CONTRAST Result Date: 05/25/2023 CLINICAL DATA:  Acute abdominal pain, nausea and vomiting for 3 days. EXAM: CT  ABDOMEN AND PELVIS WITH CONTRAST TECHNIQUE: Multidetector CT imaging of the abdomen and pelvis was performed using the standard protocol following bolus administration of intravenous contrast. RADIATION DOSE REDUCTION: This exam was performed according to the departmental dose-optimization program which includes automated exposure control, adjustment of the mA and/or kV according to patient size and/or use of iterative reconstruction technique. CONTRAST:  OMNIPAQUE  IOHEXOL  300 MG/ML  SOLN COMPARISON:  CT abdomen and pelvis dated 01/16/2022. FINDINGS: Lower chest: A 6 mm ground-glass pulmonary nodule in the left lower lobe is not significantly changed since 09/11/2020 and presumed benign given long-term stability. No imaging follow-up is recommended for this finding. Hepatobiliary: A geographic area of hypoattenuation near the gallbladder fossa/falciform ligament likely reflects focal fat. No gallstones, gallbladder wall thickening, or biliary dilatation. Pancreas: Unremarkable. No pancreatic ductal dilatation or surrounding inflammatory changes. Spleen: Normal in size without focal abnormality. Adrenals/Urinary Tract: Adrenal glands are unremarkable. Kidneys are normal, without renal calculi, focal lesion, or hydronephrosis. Bladder is unremarkable. Stomach/Bowel: Stomach is within normal limits. Appendix appears normal. No evidence of bowel wall thickening, distention, or inflammatory changes. Vascular/Lymphatic: No significant vascular findings are present. No enlarged abdominal or pelvic lymph nodes. Reproductive: Prostate is unremarkable. Other: No abdominal wall hernia or abnormality. No abdominopelvic ascites. Musculoskeletal: No acute or significant osseous findings. IMPRESSION: No acute findings in the abdomen or pelvis. Electronically Signed   By: Norman Hopper M.D.   On: 05/25/2023 08:05   Assessment/Plan Mason Mclaughlin is a 28 y.o. male with medical history significant for type 1  diabetes with insulin  pump, gastroparesis, cyclic vomiting being admitted to the hospital with intractable nausea and vomiting, dehydration, hyperglycemia, and associated diabetic ketoacidosis.   DKA-with hyperglycemia, metabolic anion gap acidosis and associated hyponatremia.  No infectious source, or intra-abdominal sources found on CT.  Likely due to his known gastroparesis and cyclic vomiting. -Inpatient admission to stepdown -Keep n.p.o. -IV insulin  drip per Endo tool, have verified that patient removed his insulin  pump -Continue to monitor every 4 hour BMP, and every 8 hour beta hydroxybutyrate -Will plan to transition off insulin  drip once anion gap is closed x 2  Lactic acidosis-due to dehydration and significant vomiting, initial lactate 6.9, now improving to 3.2.  Due to continued tachycardia, will bolus another liter normal saline now, and continue to trend lactate.  AKI-this is prerenal azotemia from dehydration related to vomiting and hyperglycemia.  Baseline renal function is normal, merge chart reviewed. -IV fluids per DKA protocol -Monitor renal function with daily labs  DVT prophylaxis: Lovenox      Code Status: Full Code  Consults called: None  Admission status: The appropriate patient status for this patient is INPATIENT. Inpatient status is judged to be reasonable and necessary in order to provide the required intensity of service to ensure the patient's safety. The patient's presenting symptoms, physical exam findings, and initial radiographic and laboratory data in the context of their chronic comorbidities is felt to place them at high risk for further clinical  deterioration. Furthermore, it is not anticipated that the patient will be medically stable for discharge from the hospital within 2 midnights of admission.    I certify that at the point of admission it is my clinical judgment that the patient will require inpatient hospital care spanning beyond 2 midnights from  the point of admission due to high intensity of service, high risk for further deterioration and high frequency of surveillance required   Time spent: 59 minutes  Damarion Mendizabal CHRISTELLA Gail MD [facility] Pager [phone]  If 7PM-7AM, please contact night-coverage [email or web address] Password [email or web address]  05/25/2023, 8:45 AM

## 2023-05-26 ENCOUNTER — Encounter (HOSPITAL_COMMUNITY): Payer: Self-pay | Admitting: Internal Medicine

## 2023-05-26 DIAGNOSIS — D72829 Elevated white blood cell count, unspecified: Secondary | ICD-10-CM | POA: Insufficient documentation

## 2023-05-26 DIAGNOSIS — N179 Acute kidney failure, unspecified: Secondary | ICD-10-CM | POA: Diagnosis not present

## 2023-05-26 DIAGNOSIS — K3184 Gastroparesis: Secondary | ICD-10-CM

## 2023-05-26 DIAGNOSIS — I10 Essential (primary) hypertension: Secondary | ICD-10-CM

## 2023-05-26 DIAGNOSIS — E1010 Type 1 diabetes mellitus with ketoacidosis without coma: Secondary | ICD-10-CM | POA: Diagnosis not present

## 2023-05-26 DIAGNOSIS — E109 Type 1 diabetes mellitus without complications: Secondary | ICD-10-CM | POA: Insufficient documentation

## 2023-05-26 DIAGNOSIS — E1065 Type 1 diabetes mellitus with hyperglycemia: Secondary | ICD-10-CM

## 2023-05-26 DIAGNOSIS — E1143 Type 2 diabetes mellitus with diabetic autonomic (poly)neuropathy: Secondary | ICD-10-CM | POA: Insufficient documentation

## 2023-05-26 DIAGNOSIS — R1115 Cyclical vomiting syndrome unrelated to migraine: Secondary | ICD-10-CM | POA: Diagnosis not present

## 2023-05-26 HISTORY — DX: Cyclical vomiting syndrome unrelated to migraine: R11.15

## 2023-05-26 HISTORY — DX: Essential (primary) hypertension: I10

## 2023-05-26 HISTORY — DX: Type 2 diabetes mellitus with diabetic autonomic (poly)neuropathy: E11.43

## 2023-05-26 HISTORY — DX: Elevated white blood cell count, unspecified: D72.829

## 2023-05-26 LAB — BASIC METABOLIC PANEL
Anion gap: 7 (ref 5–15)
Anion gap: 9 (ref 5–15)
Anion gap: 10 (ref 5–15)
Anion gap: 11 (ref 5–15)
BUN: <5 mg/dL — ABNORMAL LOW (ref 6–20)
BUN: <5 mg/dL — ABNORMAL LOW (ref 6–20)
BUN: <5 mg/dL — ABNORMAL LOW (ref 6–20)
BUN: <5 mg/dL — ABNORMAL LOW (ref 6–20)
CO2: 20 mmol/L — ABNORMAL LOW (ref 22–32)
CO2: 20 mmol/L — ABNORMAL LOW (ref 22–32)
CO2: 22 mmol/L (ref 22–32)
CO2: 23 mmol/L (ref 22–32)
Calcium: 8.4 mg/dL — ABNORMAL LOW (ref 8.9–10.3)
Calcium: 8.3 mg/dL — ABNORMAL LOW (ref 8.9–10.3)
Calcium: 8.8 mg/dL — ABNORMAL LOW (ref 8.9–10.3)
Calcium: 8.8 mg/dL — ABNORMAL LOW (ref 8.9–10.3)
Chloride: 110 mmol/L (ref 98–111)
Chloride: 107 mmol/L (ref 98–111)
Chloride: 105 mmol/L (ref 98–111)
Chloride: 100 mmol/L (ref 98–111)
Creatinine, Ser: 0.74 mg/dL (ref 0.61–1.24)
Creatinine, Ser: 0.59 mg/dL — ABNORMAL LOW (ref 0.61–1.24)
Creatinine, Ser: 0.67 mg/dL (ref 0.61–1.24)
Creatinine, Ser: 0.66 mg/dL (ref 0.61–1.24)
GFR, Estimated: >60 mL/min (ref >60)
GFR, Estimated: >60 mL/min (ref >60)
GFR, Estimated: >60 mL/min (ref >60)
GFR, Estimated: >60 mL/min (ref >60)
Glucose, Bld: 170 mg/dL — ABNORMAL HIGH (ref 70–99)
Glucose, Bld: 178 mg/dL — ABNORMAL HIGH (ref 70–99)
Glucose, Bld: 162 mg/dL — ABNORMAL HIGH (ref 70–99)
Glucose, Bld: 135 mg/dL — ABNORMAL HIGH (ref 70–99)
Potassium: 2.8 mmol/L — ABNORMAL LOW (ref 3.5–5.1)
Potassium: 2.6 mmol/L — CL (ref 3.5–5.1)
Potassium: 2.7 mmol/L — CL (ref 3.5–5.1)
Potassium: 2.5 mmol/L — CL (ref 3.5–5.1)
Sodium: 137 mmol/L (ref 135–145)
Sodium: 136 mmol/L (ref 135–145)
Sodium: 137 mmol/L (ref 135–145)
Sodium: 134 mmol/L — ABNORMAL LOW (ref 135–145)

## 2023-05-26 LAB — GLUCOSE, CAPILLARY
Glucose-Capillary: 110 mg/dL — ABNORMAL HIGH (ref 70–99)
Glucose-Capillary: 119 mg/dL — ABNORMAL HIGH (ref 70–99)
Glucose-Capillary: 151 mg/dL — ABNORMAL HIGH (ref 70–99)
Glucose-Capillary: 152 mg/dL — ABNORMAL HIGH (ref 70–99)
Glucose-Capillary: 153 mg/dL — ABNORMAL HIGH (ref 70–99)
Glucose-Capillary: 153 mg/dL — ABNORMAL HIGH (ref 70–99)
Glucose-Capillary: 156 mg/dL — ABNORMAL HIGH (ref 70–99)
Glucose-Capillary: 160 mg/dL — ABNORMAL HIGH (ref 70–99)
Glucose-Capillary: 165 mg/dL — ABNORMAL HIGH (ref 70–99)
Glucose-Capillary: 167 mg/dL — ABNORMAL HIGH (ref 70–99)
Glucose-Capillary: 176 mg/dL — ABNORMAL HIGH (ref 70–99)
Glucose-Capillary: 183 mg/dL — ABNORMAL HIGH (ref 70–99)
Glucose-Capillary: 183 mg/dL — ABNORMAL HIGH (ref 70–99)
Glucose-Capillary: 183 mg/dL — ABNORMAL HIGH (ref 70–99)
Glucose-Capillary: 183 mg/dL — ABNORMAL HIGH (ref 70–99)
Glucose-Capillary: 192 mg/dL — ABNORMAL HIGH (ref 70–99)
Glucose-Capillary: 197 mg/dL — ABNORMAL HIGH (ref 70–99)
Glucose-Capillary: 231 mg/dL — ABNORMAL HIGH (ref 70–99)
Glucose-Capillary: 469 mg/dL — ABNORMAL HIGH (ref 70–99)

## 2023-05-26 LAB — CBC
HCT: 43.7 % (ref 39.0–52.0)
Hemoglobin: 14.6 g/dL (ref 13.0–17.0)
MCH: 30.5 pg (ref 26.0–34.0)
MCHC: 33.4 g/dL (ref 30.0–36.0)
MCV: 91.2 fL (ref 80.0–100.0)
Platelets: 250 10*3/uL (ref 150–400)
RBC: 4.79 MIL/uL (ref 4.22–5.81)
RDW: 13.2 % (ref 11.5–15.5)
WBC: 17.6 10*3/uL — ABNORMAL HIGH (ref 4.0–10.5)
nRBC: 0 % (ref 0.0–0.2)

## 2023-05-26 LAB — BETA-HYDROXYBUTYRIC ACID
Beta-Hydroxybutyric Acid: 1.21 mmol/L — ABNORMAL HIGH (ref 0.05–0.27)
Beta-Hydroxybutyric Acid: 0.77 mmol/L — ABNORMAL HIGH (ref 0.05–0.27)

## 2023-05-26 LAB — HIV ANTIBODY (ROUTINE TESTING W REFLEX): HIV Screen 4th Generation wRfx: NONREACTIVE

## 2023-05-26 LAB — MAGNESIUM: Magnesium: 1.7 mg/dL (ref 1.7–2.4)

## 2023-05-26 MED ORDER — DEXTROSE-SODIUM CHLORIDE 5-0.9 % IV SOLN: INTRAVENOUS | Status: DC

## 2023-05-26 MED ORDER — LISINOPRIL 10 MG PO TABS
40.0000 mg | ORAL_TABLET | Freq: Every day | ORAL | Status: DC
Start: 2023-05-26 — End: 2023-05-27
  Administered 2023-05-26: 40 mg via ORAL
  Filled 2023-05-26: qty 4

## 2023-05-26 MED ORDER — STERILE WATER FOR INJECTION IJ SOLN
INTRAMUSCULAR | Status: AC
  Filled 2023-05-26: qty 10

## 2023-05-26 MED ORDER — KETOROLAC TROMETHAMINE 15 MG/ML IJ SOLN
INTRAMUSCULAR | Status: AC
  Administered 2023-05-26: 15 mg
  Filled 2023-05-26: qty 1

## 2023-05-26 MED ORDER — KETOROLAC TROMETHAMINE 15 MG/ML IJ SOLN: 15.0000 mg | Freq: Once | INTRAMUSCULAR | Status: DC

## 2023-05-26 MED ORDER — POTASSIUM CHLORIDE CRYS ER 20 MEQ PO TBCR
40.0000 meq | EXTENDED_RELEASE_TABLET | ORAL | Status: AC
  Administered 2023-05-26 (×3): 40 meq via ORAL
  Filled 2023-05-26 (×3): qty 2

## 2023-05-26 MED ORDER — MAGNESIUM SULFATE 2 GM/50ML IV SOLN
2.0000 g | Freq: Once | INTRAVENOUS | Status: AC
  Administered 2023-05-26: 2 g via INTRAVENOUS
  Filled 2023-05-26: qty 50

## 2023-05-26 MED ORDER — AMLODIPINE BESYLATE 5 MG PO TABS
5.0000 mg | ORAL_TABLET | Freq: Every day | ORAL | Status: DC
  Administered 2023-05-26: 5 mg via ORAL
  Filled 2023-05-26: qty 1

## 2023-05-26 MED ORDER — POTASSIUM CHLORIDE 10 MEQ/100ML IV SOLN
10.0000 meq | INTRAVENOUS | Status: AC
  Administered 2023-05-26 (×3): 10 meq via INTRAVENOUS
  Filled 2023-05-26 (×3): qty 100

## 2023-05-26 NOTE — Plan of Care (Signed)

## 2023-05-26 NOTE — Plan of Care (Signed)
  Problem: Clinical Measurements: Goal: Respiratory complications will improve Outcome: Progressing   Problem: Coping: Goal: Level of anxiety will decrease Outcome: Progressing   Problem: Pain Management: Goal: General experience of comfort will improve Outcome: Progressing   Problem: Safety: Goal: Ability to remain free from injury will improve Outcome: Progressing   Problem: Skin Integrity: Goal: Risk for impaired skin integrity will decrease Outcome: Progressing   Problem: Nutrition: Goal: Adequate nutrition will be maintained Outcome: Not Progressing

## 2023-05-26 NOTE — Inpatient Diabetes Management (Signed)
 Inpatient Diabetes Program Recommendations  AACE/ADA: New Consensus Statement on Inpatient Glycemic Control (2015)  Target Ranges:  Prepandial:   less than 140 mg/dL      Peak postprandial:   less than 180 mg/dL (1-2 hours)      Critically ill patients:  140 - 180 mg/dL   Lab Results  Component Value Date   GLUCAP 153 (H) 05/26/2023    Review of Glycemic Control  Diabetes history: T1DM Outpatient Diabetes medications: T-slim insulin  pump with Novolog  Current orders for Inpatient glycemic control: IV insulin  per EndoTool for DKA  Need updated HgbA1C Awaiting BHB at present  Inpatient Diabetes Program Recommendations:    Spoke with pt at bedside regarding his diabetes control and being admitted with DKA. Pt states he had changed pump 2 days ago and blood sugars started to rise, he gave himself extra boluses and said blood sugars continued to go up. Then came to [facility]. Explained to pt that he will need to call pump company and let them do a quality control check. States he ran out of the Dexcom G7 CGM d/t insurance starting on 05/25/23. Was using meter to check blood sugars, although not on a regular basis. States he hasn't seen PCP at [facility] since Sept or Oct. Will need f/u appt after discharge.   Continue to follow while inpatient.   Thank you. Shona Brandy, RD, LDN, CDCES Inpatient Diabetes Coordinator [phone]

## 2023-05-26 NOTE — Progress Notes (Signed)
   05/26/23 0817  TOC Brief Assessment  Insurance and Status Reviewed  Patient has primary care physician Yes  Home environment has been reviewed Resides alone in an apartment  Prior level of function: Independent at baseline  Prior/Current Home Services No current home services  Social Drivers of Health Review SDOH reviewed no interventions necessary  Readmission risk has been reviewed Yes  Transition of care needs no transition of care needs at this time

## 2023-05-26 NOTE — Hospital Course (Addendum)
 28 year old man PMH diabetes mellitus type 1 on insulin  pump, diabetic gastroparesis, cyclical vomiting, presented with intractable nausea and vomiting, workup revealed DKA.  Does intermittently smoke marijuana.  Consultants None  Procedures/Events 1/1 admitted for DKA  1/2 still in DKA. BP high.

## 2023-05-26 NOTE — Progress Notes (Addendum)
  Progress Note   Patient: Mason Mclaughlin Bais FMW:[id number] DOB: 06-03-1995 DOA: 05/25/2023     1 DOS: the patient was seen and examined on 05/26/2023   Brief hospital course: 28 year old man PMH diabetes mellitus type 1 on insulin  pump, diabetic gastroparesis, cyclical vomiting, presented with intractable nausea and vomiting, workup revealed DKA.  Does intermittently smoke marijuana.  Consultants None  Procedures/Events 1/1 admitted for DKA  1/2 still in DKA. BP high.  Assessment and Plan: DKA Diabetes mellitus type 1 maintained on insulin  pump Diabetic gastroparesis Cyclical vomiting Precipitating event unclear, no evidence of infection or acute intra-abdominal problems.  CT abdomen pelvis negative.  Probably related to known gastroparesis and cyclical vomiting. Exam benign, no pain. Blood sugar stable but beta hydroxybutyric acid still high.  Will continue insulin  infusion until clear.  However will allow diet. Check hemoglobin A1c  AKI -- resolved Creatinine 1.39 with peak of 1.66, down to 0.87 today. Secondary to vomiting and dehydration as well as osmotic diuresis from hyperglycemia.  Essential HTN Noted overnight with systolic up to 220.  Treated with PRN medications and symptom management. Review PCP note from November and second chart notes patient was restarted on lisinopril  40, Norvasc  5  Chronic leukocytosis Patient has 2 charts marked for merge.  Other chart reveals chronic leukocytosis dating back to 2019. Continue to follow with new PCP as of 03/2023 Dr. Saad Nooruddin at [facility].  Consider outpatient hematology evaluation.    Subjective:  Feels better No pain No n/v Hungry No headache BP runs high sometimes  Physical Exam: Vitals:   05/26/23 0500 05/26/23 0700 05/26/23 0748 05/26/23 0800  BP: 120/66 124/70    Pulse: 89 90  (!) 117  Resp: 17 17  17   Temp:   98.4 F (36.9 C) 98.4 F (36.9 C)  TempSrc:   Oral Oral  SpO2: 99%  100%  100%  Weight:      Height:       Physical Exam Vitals reviewed.  Constitutional:      General: He is not in acute distress.    Appearance: He is not ill-appearing or toxic-appearing.  Cardiovascular:     Rate and Rhythm: Normal rate and regular rhythm.     Heart sounds: No murmur heard.    Comments: Telemetry SR Pulmonary:     Effort: Pulmonary effort is normal. No respiratory distress.     Breath sounds: No wheezing, rhonchi or rales.  Abdominal:     General: There is no distension.     Palpations: Abdomen is soft.     Tenderness: There is no abdominal tenderness.  Musculoskeletal:     Right lower leg: No edema.     Left lower leg: No edema.  Neurological:     Mental Status: He is alert.  Psychiatric:        Mood and Affect: Mood normal.        Behavior: Behavior normal.     Data Reviewed: Anion gap is cleared with last 3 values 14, 11, 14 CO2 remains low but is trending up from 14, 18, 17 Beta hydroxybutyric acid 4.02 > 1.21 Urine drug screen was negative Urinalysis was unremarkable  Family Communication: none present or requested  Disposition: Status is: Inpatient Remains inpatient appropriate because: DKA     Time spent: 35 minutes  Author: Toribio Door, MD 05/26/2023 9:02 AM  For on call review [email or web address].

## 2023-05-27 ENCOUNTER — Encounter (HOSPITAL_COMMUNITY): Payer: Self-pay

## 2023-05-27 ENCOUNTER — Encounter (HOSPITAL_COMMUNITY): Payer: Self-pay | Admitting: Internal Medicine

## 2023-05-27 DIAGNOSIS — K3184 Gastroparesis: Secondary | ICD-10-CM | POA: Diagnosis not present

## 2023-05-27 DIAGNOSIS — I10 Essential (primary) hypertension: Secondary | ICD-10-CM | POA: Diagnosis not present

## 2023-05-27 DIAGNOSIS — E1010 Type 1 diabetes mellitus with ketoacidosis without coma: Secondary | ICD-10-CM | POA: Diagnosis not present

## 2023-05-27 DIAGNOSIS — E1065 Type 1 diabetes mellitus with hyperglycemia: Secondary | ICD-10-CM | POA: Diagnosis not present

## 2023-05-27 LAB — GLUCOSE, CAPILLARY
Glucose-Capillary: 120 mg/dL — ABNORMAL HIGH (ref 70–99)
Glucose-Capillary: 235 mg/dL — ABNORMAL HIGH (ref 70–99)
Glucose-Capillary: 253 mg/dL — ABNORMAL HIGH (ref 70–99)
Glucose-Capillary: 283 mg/dL — ABNORMAL HIGH (ref 70–99)
Glucose-Capillary: 338 mg/dL — ABNORMAL HIGH (ref 70–99)

## 2023-05-27 LAB — BASIC METABOLIC PANEL
Anion gap: 12 (ref 5–15)
BUN: <5 mg/dL — ABNORMAL LOW (ref 6–20)
CO2: 21 mmol/L — ABNORMAL LOW (ref 22–32)
Calcium: 8.7 mg/dL — ABNORMAL LOW (ref 8.9–10.3)
Chloride: 107 mmol/L (ref 98–111)
Creatinine, Ser: 0.53 mg/dL — ABNORMAL LOW (ref 0.61–1.24)
GFR, Estimated: >60 mL/min (ref >60)
Glucose, Bld: 103 mg/dL — ABNORMAL HIGH (ref 70–99)
Potassium: 2.8 mmol/L — ABNORMAL LOW (ref 3.5–5.1)
Sodium: 140 mmol/L (ref 135–145)

## 2023-05-27 LAB — BETA-HYDROXYBUTYRIC ACID: Beta-Hydroxybutyric Acid: 0.11 mmol/L (ref 0.05–0.27)

## 2023-05-27 LAB — MAGNESIUM: Magnesium: 2.2 mg/dL (ref 1.7–2.4)

## 2023-05-27 MED ORDER — AMLODIPINE BESYLATE 5 MG PO TABS: 10.0000 mg | ORAL_TABLET | Freq: Every day | ORAL | 0 refills | Status: AC

## 2023-05-27 MED ORDER — INSULIN GLARGINE-YFGN 100 UNIT/ML ~~LOC~~ SOLN
5.0000 [IU] | Freq: Once | SUBCUTANEOUS | Status: DC
  Filled 2023-05-27: qty 0.05

## 2023-05-27 MED ORDER — INSULIN ASPART 100 UNIT/ML IJ SOLN: 0.0000 [IU] | Freq: Every day | INTRAMUSCULAR | Status: DC

## 2023-05-27 MED ORDER — POTASSIUM CHLORIDE CRYS ER 20 MEQ PO TBCR
40.0000 meq | EXTENDED_RELEASE_TABLET | ORAL | Status: DC
  Filled 2023-05-27 (×2): qty 2

## 2023-05-27 MED ORDER — CHLORTHALIDONE 25 MG PO TABS: 25.0000 mg | ORAL_TABLET | Freq: Every day | ORAL | 2 refills | Status: AC

## 2023-05-27 MED ORDER — SODIUM CHLORIDE 0.9 % IV SOLN
12.5000 mg | Freq: Once | INTRAVENOUS | Status: AC
  Administered 2023-05-27: 12.5 mg via INTRAVENOUS
  Filled 2023-05-27: qty 12.5

## 2023-05-27 MED ORDER — POTASSIUM CHLORIDE 10 MEQ/100ML IV SOLN
10.0000 meq | INTRAVENOUS | Status: AC
  Administered 2023-05-27 (×3): 10 meq via INTRAVENOUS
  Filled 2023-05-27 (×3): qty 100

## 2023-05-27 MED ORDER — INSULIN ASPART 100 UNIT/ML IJ SOLN
2.0000 [IU] | Freq: Once | INTRAMUSCULAR | Status: AC
  Administered 2023-05-27: 2 [IU] via SUBCUTANEOUS

## 2023-05-27 MED ORDER — CHLORTHALIDONE 25 MG PO TABS
25.0000 mg | ORAL_TABLET | Freq: Every day | ORAL | Status: DC
  Filled 2023-05-27: qty 1

## 2023-05-27 MED ORDER — HYDRALAZINE HCL 20 MG/ML IJ SOLN: 10.0000 mg | Freq: Four times a day (QID) | INTRAMUSCULAR | Status: DC | PRN

## 2023-05-27 MED ORDER — AMLODIPINE BESYLATE 10 MG PO TABS: 10.0000 mg | ORAL_TABLET | Freq: Every day | ORAL | Status: DC

## 2023-05-27 MED ORDER — INSULIN ASPART 100 UNIT/ML IJ SOLN
0.0000 [IU] | Freq: Three times a day (TID) | INTRAMUSCULAR | Status: DC
  Administered 2023-05-27: 7 [IU] via SUBCUTANEOUS

## 2023-05-27 MED ORDER — INSULIN GLARGINE-YFGN 100 UNIT/ML ~~LOC~~ SOLN
5.0000 [IU] | SUBCUTANEOUS | Status: DC
  Administered 2023-05-27: 5 [IU] via SUBCUTANEOUS
  Filled 2023-05-27: qty 0.05

## 2023-05-27 NOTE — Discharge Summary (Signed)
 Physician Discharge Summary   Patient: Mason Mclaughlin MRN: [id number] DOB: 15-Dec-1995  Admit date:     05/25/2023  Discharge date: 05/27/23  Discharge Physician: Toribio Door   PCP: Nooruddin, Saad, MD   Recommendations at discharge:   Patient left AGAINST MEDICAL ADVICE  Did recommend follow-up for diabetes and hypertension  Discharge Diagnoses: Principal Problem:   DKA, type 1 (HCC) Active Problems:   DM type 1 (diabetes mellitus, type 1) (HCC)   Diabetic gastroparesis (HCC)   Cyclical vomiting   Leukocytosis   AKI (acute kidney injury) (HCC)   Benign essential HTN  Resolved Problems:   * No resolved hospital problems. *  Hospital Course: 28 year old man PMH diabetes mellitus type 1 on insulin  pump, diabetic gastroparesis, cyclical vomiting, presented with intractable nausea and vomiting, workup revealed DKA.  Treated for DKA with standard therapy and resolution of acidosis.  Somewhat hyperglycemic but main ongoing issue was significant hypertension.  It was recommended patient stay for further titration of blood pressure medications to improve control but the patient declined.  He had capacity and understood the risks and benefits.  Antihypertensive medication was prescribed.  Consultants None  Procedures/Events 1/1 admitted for DKA  1/2 still in DKA. BP high.  *DKA -- resolved Diabetes mellitus type 1 maintained on insulin  pump Diabetic gastroparesis Cyclical vomiting Precipitating event unclear, no evidence of infection or acute intra-abdominal problems.  CT abdomen pelvis negative.  Probably related to known gastroparesis and cyclical vomiting. Beta hydroxybutyric acid within normal limits last night.  Transitioned to subcutaneous last night. Hyperglycemic this AM 338. Hemoglobin A1c pending.   Hypokalemia Magnesium  within normal limits at midnight. Check potassium this a.m.   Essential HTN Review PCP note from November and second chart notes  patient was restarted on lisinopril  40, Norvasc  5  Both systolic and diastolic blood pressure significantly elevated.   Chronic leukocytosis Patient has 2 charts marked for merge.  Other chart reveals chronic leukocytosis dating back to 2019. Continue to follow with new PCP as of 03/2023 Dr. Saad Nooruddin at [facility].  Consider outpatient hematology evaluation.   AKI -- resolved Creatinine 1.39 with peak of 1.66, now within normal limits. Secondary to vomiting and dehydration as well as osmotic diuresis from hyperglycemia.  Disposition:  Left AMA Diet recommendation:  Cardiac and Carb modified diet DISCHARGE MEDICATION: Allergies as of 05/27/2023   No Known Allergies      Medication List     TAKE these medications    amLODipine  5 MG tablet Commonly known as: NORVASC  Take 2 tablets (10 mg total) by mouth daily.   chlorthalidone  25 MG tablet Commonly known as: HYGROTON  Take 1 tablet (25 mg total) by mouth daily.   lisinopril  40 MG tablet Commonly known as: ZESTRIL  Take 40 mg by mouth daily.   NovoLOG  ReliOn 100 UNIT/ML injection Generic drug: insulin  aspart Inject 75 Units into the skin daily at 6 (six) AM.   ondansetron  4 MG disintegrating tablet Commonly known as: ZOFRAN -ODT Take 4 mg by mouth every 8 (eight) hours as needed for nausea or vomiting.   pantoprazole  40 MG tablet Commonly known as: PROTONIX  Take 40 mg by mouth daily.       Feels fine No HA No pain Breathing ok  Discharge Exam: Filed Weights   05/25/23 0043 05/25/23 2005  Weight: 72.6 kg 66.9 kg   Physical Exam Vitals reviewed.  Constitutional:      General: He is not in acute distress.  Appearance: He is not ill-appearing or toxic-appearing.  Cardiovascular:     Rate and Rhythm: Normal rate and regular rhythm.     Heart sounds: No murmur heard. Pulmonary:     Effort: Pulmonary effort is normal. No respiratory distress.     Breath sounds: No wheezing, rhonchi or rales.   Neurological:     Mental Status: He is alert.  Psychiatric:        Mood and Affect: Mood normal.        Behavior: Behavior normal.      Condition at discharge: good  The results of significant diagnostics from this hospitalization (including imaging, microbiology, ancillary and laboratory) are listed below for reference.   Imaging Studies: CT ABDOMEN PELVIS W CONTRAST Result Date: 05/25/2023 CLINICAL DATA:  Acute abdominal pain, nausea and vomiting for 3 days. EXAM: CT ABDOMEN AND PELVIS WITH CONTRAST TECHNIQUE: Multidetector CT imaging of the abdomen and pelvis was performed using the standard protocol following bolus administration of intravenous contrast. RADIATION DOSE REDUCTION: This exam was performed according to the departmental dose-optimization program which includes automated exposure control, adjustment of the mA and/or kV according to patient size and/or use of iterative reconstruction technique. CONTRAST:  100mL OMNIPAQUE  IOHEXOL  300 MG/ML  SOLN COMPARISON:  CT abdomen and pelvis dated 01/16/2022. FINDINGS: Lower chest: A 6 mm ground-glass pulmonary nodule in the left lower lobe is not significantly changed since 09/11/2020 and presumed benign given long-term stability. No imaging follow-up is recommended for this finding. Hepatobiliary: A geographic area of hypoattenuation near the gallbladder fossa/falciform ligament likely reflects focal fat. No gallstones, gallbladder wall thickening, or biliary dilatation. Pancreas: Unremarkable. No pancreatic ductal dilatation or surrounding inflammatory changes. Spleen: Normal in size without focal abnormality. Adrenals/Urinary Tract: Adrenal glands are unremarkable. Kidneys are normal, without renal calculi, focal lesion, or hydronephrosis. Bladder is unremarkable. Stomach/Bowel: Stomach is within normal limits. Appendix appears normal. No evidence of bowel wall thickening, distention, or inflammatory changes. Vascular/Lymphatic: No significant  vascular findings are present. No enlarged abdominal or pelvic lymph nodes. Reproductive: Prostate is unremarkable. Other: No abdominal wall hernia or abnormality. No abdominopelvic ascites. Musculoskeletal: No acute or significant osseous findings. IMPRESSION: No acute findings in the abdomen or pelvis. Electronically Signed   By: Norman Hopper M.D.   On: 05/25/2023 08:05    Microbiology: Results for orders placed or performed during the hospital encounter of 05/25/23  MRSA Next Gen by PCR, Nasal     Status: None   Collection Time: 05/25/23  7:58 PM   Specimen: Nasal Mucosa; Nasal Swab  Result Value Ref Range Status   MRSA by PCR Next Gen NOT DETECTED NOT DETECTED Final    Comment: (NOTE) The GeneXpert MRSA Assay (FDA approved for NASAL specimens only), is one component of a comprehensive MRSA colonization surveillance program. It is not intended to diagnose MRSA infection nor to guide or monitor treatment for MRSA infections. Test performance is not FDA approved in patients less than 10 years old. Performed at [facility], [address], [place], [place] [address]     Labs: CBC: Recent Labs  Lab 05/25/23 0107 05/26/23 0305  WBC 15.9* 17.6*  NEUTROABS 12.5*  --   HGB 14.8 14.6  HCT 42.9 43.7  MCV 88.1 91.2  PLT 275 250   Basic Metabolic Panel: Recent Labs  Lab 05/26/23 0740 05/26/23 1044 05/26/23 1413 05/26/23 1906 05/27/23 0033 05/27/23 0038  NA 137 136 137 134*  --  140  K 2.8* 2.6* 2.7* 2.5*  --  2.8*  CL 110 107 105 100  --  107  CO2 20* 20* 22 23  --  21*  GLUCOSE 170* 178* 162* 135*  --  103*  BUN <5* <5* <5* <5*  --  <5*  CREATININE 0.74 0.59* 0.67 0.66  --  0.53*  CALCIUM  8.4* 8.3* 8.8* 8.8*  --  8.7*  MG  --  1.7  --   --  2.2  --    Liver Function Tests: Recent Labs  Lab 05/25/23 0107  AST 34  ALT 25  ALKPHOS 77  BILITOT 2.2*  PROT 8.0  ALBUMIN 4.3   CBG: Recent Labs  Lab 05/27/23 0055 05/27/23 0204 05/27/23 0317  05/27/23 0410 05/27/23 0740  GLUCAP 120* 283* 253* 235* 338*    Discharge time spent: less than 30 minutes.  Signed: Toribio Door, MD [facility] 05/27/2023

## 2023-05-27 NOTE — Inpatient Diabetes Management (Signed)
 Inpatient Diabetes Program Recommendations  AACE/ADA: New Consensus Statement on Inpatient Glycemic Control (2015)  Target Ranges:  Prepandial:   less than 140 mg/dL      Peak postprandial:   less than 180 mg/dL (1-2 hours)      Critically ill patients:  140 - 180 mg/dL   Lab Results  Component Value Date   GLUCAP 338 (H) 05/27/2023    Review of Glycemic Control  Diabetes history: DM1 Outpatient Diabetes medications: Insulin  pump Current orders for Inpatient glycemic control: Semglee  5 Q24H, Semglee  5 units 1x dose on 1/3 @ 1000  Endo - at [facility], Dr Lucienne Uses Dexcom CGM   Total basal - 33.25 units Total bolus - 1:5 CHO ratio and 1 unit drops BG 30 mg/dL  Inpatient Diabetes Program Recommendations:    Increase Semglee  to 15 units BID   Add Novolog  8 units TID with meals if eating > 50%  Will need f/u appt with [facility] after discharge.  Thank you. Shona Brandy, RD, LDN, CDCES Inpatient Diabetes Coordinator [phone]

## 2023-05-30 ENCOUNTER — Telehealth: Payer: Self-pay | Admitting: Family Medicine

## 2023-05-30 ENCOUNTER — Ambulatory Visit: Payer: Commercial Managed Care - PPO | Admitting: Internal Medicine

## 2023-05-30 VITALS — BP 143/90 | HR 102 | Temp 98.2°F | Ht 66.0 in | Wt 148.9 lb

## 2023-05-30 DIAGNOSIS — E109 Type 1 diabetes mellitus without complications: Secondary | ICD-10-CM

## 2023-05-30 DIAGNOSIS — E1143 Type 2 diabetes mellitus with diabetic autonomic (poly)neuropathy: Secondary | ICD-10-CM

## 2023-05-30 DIAGNOSIS — I10 Essential (primary) hypertension: Secondary | ICD-10-CM

## 2023-05-30 DIAGNOSIS — I152 Hypertension secondary to endocrine disorders: Secondary | ICD-10-CM

## 2023-05-30 DIAGNOSIS — E1059 Type 1 diabetes mellitus with other circulatory complications: Secondary | ICD-10-CM

## 2023-05-30 DIAGNOSIS — E1043 Type 1 diabetes mellitus with diabetic autonomic (poly)neuropathy: Secondary | ICD-10-CM | POA: Diagnosis not present

## 2023-05-30 DIAGNOSIS — K3184 Gastroparesis: Secondary | ICD-10-CM

## 2023-05-30 DIAGNOSIS — R112 Nausea with vomiting, unspecified: Secondary | ICD-10-CM

## 2023-05-30 LAB — GLUCOSE, CAPILLARY: Glucose-Capillary: 156 mg/dL — ABNORMAL HIGH (ref 70–99)

## 2023-05-30 LAB — POCT GLYCOSYLATED HEMOGLOBIN (HGB A1C): Hemoglobin A1C: 8.3 % — AB (ref 4.0–5.6)

## 2023-05-30 MED ORDER — AMLODIPINE BESYLATE 10 MG PO TABS
10.0000 mg | ORAL_TABLET | Freq: Every day | ORAL | 11 refills | Status: DC
Start: 2023-05-30 — End: 2023-08-10

## 2023-05-30 MED ORDER — LISINOPRIL 40 MG PO TABS: 40.0000 mg | ORAL_TABLET | Freq: Every day | ORAL | 2 refills | Status: AC

## 2023-05-30 NOTE — Assessment & Plan Note (Signed)
 Patient left recent hospital admission despite recommendation to stay for anti-hypertensive titration. BP 140/86, on recheck 143/90. He did take his BP medicine today. OP regimen is lisinopril  40 mg daily, amlodipine  5 mg daily, chlorthalidone  25 mg daily which he is compliant with.  Plan:BMP today. Increase amlodipine  to 10 mg daily. Continue chlorthalidone , lisinopril . F/u for RN visit in 2 weeks.

## 2023-05-30 NOTE — Patient Instructions (Signed)
 It was a pleasure to care for you today!  I will let you know what your lab results show and if any changes to your care are needed as a result.  Please follow up with your endocrinologist and GI doctor for your diabetes and gastroparesis.  Increase your amlodipine  to 10 mg daily. I have updated this prescription and sent in a refill.  Please follow up in 2 weeks for a nursing visit for a check of your BP.  My best, Dr. Addie

## 2023-05-30 NOTE — Progress Notes (Signed)
 CC: HFU  HPI:  Mason Mclaughlin is a 28 y.o. male with past medical history as detailed below who presents for HFU. Please see problem based charting for detailed assessment and plan.  Past Medical History:  Diagnosis Date   Benign essential HTN 05/26/2023   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   Diabetes (HCC)    Diabetic gastroparesis (HCC) 05/26/2023   DKA (diabetic ketoacidoses)    DKA (diabetic ketoacidosis) (HCC) 05/05/2020   DKA, type 1 (HCC)    DM type 1 (diabetes mellitus, type 1) (HCC) 05/26/2023   Insulin  pump in place    Leukocytosis 05/26/2023   Chronic to at least 2019    Review of Systems:  Negative unless otherwise stated.  Physical Exam:  Vitals:   05/30/23 1427 05/30/23 1500  BP: (!) 140/86 (!) 143/90  Pulse: 100 (!) 102  Temp: 98.2 F (36.8 C)   TempSrc: Oral   SpO2: 100%   Weight: 148 lb 14.4 oz (67.5 kg)   Height: 5' 6 (1.676 m)    Physical Exam Constitutional:      General: He is not in acute distress.    Appearance: Normal appearance. He is not ill-appearing or toxic-appearing.  Cardiovascular:     Rate and Rhythm: Normal rate.  Pulmonary:     Effort: Pulmonary effort is normal. No respiratory distress.  Musculoskeletal:     Right lower leg: No edema.     Left lower leg: No edema.  Skin:    General: Skin is warm and dry.  Neurological:     General: No focal deficit present.     Mental Status: He is alert and oriented to person, place, and time.  Psychiatric:        Mood and Affect: Mood normal.        Behavior: Behavior normal.        Thought Content: Thought content normal.    Assessment & Plan:   See Encounters Tab for problem based charting.  Type 1 diabetes mellitus not at goal [facility]) Patient recely admitted 01/01-01/03 for DKA. No clear precipitating event. Left AMA prior to completion of treatment; at time of discharge, K was very low at 2.8. No OP supplementation. HbA1c 9.7% 02/2023, today is 8.3%. OP regimen is  novolog  SSI TID with meals via pump. He does not use semglee  though it is on his medication list, rather he has novolog  75 units daily via insulin  pump in addition to mealtime dosing. He does check his blood sugar at home but has not been able to wear his Dexcom recently. He states that his CBG have largely been ranging in the mid-100s, with wide range of 70s-300s. His highs typically are in the evenings. Fasting CBG is typically mid-100s as well. He denies excess thirst or urination, and says he feels back to normal from recent bout of DKA. Last eye exam was in July and he says he got a good report. He is unable to provide urine sample for check of urine microalbumin/creatinine. He just got insurance and is planning to re-establish with endocrinology. Plan:BMP today. No changes to insulin  regimen. Emphasized importance of re-establishing with endocrinology.  Diabetic gastroparesis (HCC) Patient is requesting an alternative to zofran  as it does not seem to be as effective for his nausea 2/2 gastroparesis. He has been on phenergan  in the past which he thinks worked better for symptom control and is interested in trying this again. Qtc 497 when checked during recent admission. Per chart review he  has been evaluated by GI with normal gastric emptying studies and recommendations per endocrinologist to be evaluated by his Gi doctor again.  Plan:Recommend follow up with GI. Advised against frequent use of zofran  if he can avoid it. Counseled on marijuana use and possible exacerbation of symptoms.  Benign essential HTN Patient left recent hospital admission despite recommendation to stay for anti-hypertensive titration. BP 140/86, on recheck 143/90. He did take his BP medicine today. OP regimen is lisinopril  40 mg daily, amlodipine  5 mg daily, chlorthalidone  25 mg daily which he is compliant with.  Plan:BMP today. Increase amlodipine  to 10 mg daily. Continue chlorthalidone , lisinopril . F/u for RN visit in 2  weeks.  Patient discussed with Dr. Guilloud

## 2023-05-30 NOTE — Progress Notes (Signed)
 Needs note from hospital for work.  [place]

## 2023-05-30 NOTE — Assessment & Plan Note (Signed)
 Patient recely admitted 01/01-01/03 for DKA. No clear precipitating event. Left AMA prior to completion of treatment; at time of discharge, K was very low at 2.8. No OP supplementation. HbA1c 9.7% 02/2023, today is 8.3%. OP regimen is novolog  SSI TID with meals via pump. He does not use semglee  though it is on his medication list, rather he has novolog  75 units daily via insulin  pump in addition to mealtime dosing. He does check his blood sugar at home but has not been able to wear his Dexcom recently. He states that his CBG have largely been ranging in the mid-100s, with wide range of 70s-300s. His highs typically are in the evenings. Fasting CBG is typically mid-100s as well. He denies excess thirst or urination, and says he feels back to normal from recent bout of DKA. Last eye exam was in July and he says he got a good report. He is unable to provide urine sample for check of urine microalbumin/creatinine. He just got insurance and is planning to re-establish with endocrinology. Plan:BMP today. No changes to insulin  regimen. Emphasized importance of re-establishing with endocrinology.

## 2023-05-30 NOTE — Assessment & Plan Note (Signed)
 Patient is requesting an alternative to zofran  as it does not seem to be as effective for his nausea 2/2 gastroparesis. He has been on phenergan  in the past which he thinks worked better for symptom control and is interested in trying this again. Qtc 497 when checked during recent admission. Per chart review he has been evaluated by GI with normal gastric emptying studies and recommendations per endocrinologist to be evaluated by his Gi doctor again.  Plan:Recommend follow up with GI. Advised against frequent use of zofran  if he can avoid it. Counseled on marijuana use and possible exacerbation of symptoms.

## 2023-05-31 ENCOUNTER — Telehealth: Payer: Self-pay | Admitting: Internal Medicine

## 2023-05-31 LAB — BMP8+ANION GAP
Anion Gap: 20 mmol/L — ABNORMAL HIGH (ref 10.0–18.0)
BUN/Creatinine Ratio: 6 — ABNORMAL LOW (ref 9–20)
BUN: 6 mg/dL (ref 6–20)
CO2: 27 mmol/L (ref 20–29)
Calcium: 10.1 mg/dL (ref 8.7–10.2)
Chloride: 88 mmol/L — ABNORMAL LOW (ref 96–106)
Creatinine, Ser: 0.93 mg/dL (ref 0.76–1.27)
Glucose: 164 mg/dL — ABNORMAL HIGH (ref 70–99)
Potassium: 2.9 mmol/L — ABNORMAL LOW (ref 3.5–5.2)
Sodium: 135 mmol/L (ref 134–144)
eGFR: 116 mL/min/{1.73_m2} (ref >59)

## 2023-05-31 MED ORDER — POTASSIUM CHLORIDE CRYS ER 20 MEQ PO TBCR: 20.0000 meq | EXTENDED_RELEASE_TABLET | Freq: Two times a day (BID) | ORAL | 0 refills | Status: AC

## 2023-05-31 NOTE — Addendum Note (Signed)
 Addended by: Ihor Dow on: 05/31/2023 09:04 AM   Modules accepted: Orders

## 2023-05-31 NOTE — Telephone Encounter (Addendum)
 Unsuccessful attempt to contact patient regarding hypokalemia and need for supplementation. I have signed a prescription for this in the meantime. A magnesium  level is pending, and if necessary, I will supplement this as well.  ADDENDUM: Spoke with patient who is in agreement with current plan. I will update him on if he needs magnesium  supplementation or not once that result is in.  Damien Hutchinson, DO

## 2023-06-01 NOTE — Progress Notes (Signed)
 Internal Medicine Clinic Attending  Case discussed with the resident at the time of the visit.  We reviewed the resident's history and exam and pertinent patient test results.  I agree with the assessment, diagnosis, and plan of care documented in the resident's note.

## 2023-06-02 ENCOUNTER — Encounter: Payer: Self-pay | Admitting: Internal Medicine

## 2023-06-03 ENCOUNTER — Encounter: Payer: Self-pay | Admitting: Internal Medicine

## 2023-06-03 ENCOUNTER — Other Ambulatory Visit: Payer: Self-pay | Admitting: Internal Medicine

## 2023-06-03 DIAGNOSIS — E876 Hypokalemia: Secondary | ICD-10-CM

## 2023-06-28 ENCOUNTER — Encounter: Payer: Self-pay | Admitting: Student

## 2023-06-28 NOTE — Progress Notes (Deleted)
   Established Patient Office Visit  Subjective   Patient ID: Mason Mclaughlin, male    DOB: Sep 03, 1995  Age: 28 y.o. MRN: [id number]  No chief complaint on file.   Mason Mclaughlin is a 28 y.o. who presents to the clinic for ***. Please see problem based assessment and plan for additional details.   Patient Active Problem List   Diagnosis Date Noted   Diabetic gastroparesis (HCC) 05/26/2023   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   Leukocytosis 05/26/2023   AKI (acute kidney injury) (HCC) 05/26/2023   Benign essential HTN 05/26/2023   Depression 03/31/2023   Hypertension complicating diabetes (HCC) 11/23/2019   Type 1 diabetes mellitus not at goal [facility]) 09/12/2011      ROS    Objective:     There were no vitals taken for this visit. BP Readings from Last 3 Encounters:  05/30/23 (!) 143/90  05/27/23 (!) 193/121  03/30/23 (!) 156/101   Wt Readings from Last 3 Encounters:  05/30/23 148 lb 14.4 oz (67.5 kg)  05/25/23 147 lb 7.8 oz (66.9 kg)  03/30/23 161 lb 11.2 oz (73.3 kg)      Physical Exam   No results found for any visits on 06/28/23.  Last metabolic panel Lab Results  Component Value Date   GLUCOSE 164 (H) 05/30/2023   NA 135 05/30/2023   K 2.9 (L) 05/30/2023   CL 88 (L) 05/30/2023   CO2 27 05/30/2023   BUN 6 05/30/2023   CREATININE 0.93 05/30/2023   EGFR 116 05/30/2023   CALCIUM  10.1 05/30/2023   PHOS 2.2 (L) 03/09/2023   PROT 8.0 05/25/2023   ALBUMIN 4.3 05/25/2023   BILITOT 2.2 (H) 05/25/2023   ALKPHOS 77 05/25/2023   AST 34 05/25/2023   ALT 25 05/25/2023   ANIONGAP 12 05/27/2023   Last lipids No results found for: CHOL, HDL, LDLCALC, LDLDIRECT, TRIG, CHOLHDL Last hemoglobin A1c Lab Results  Component Value Date   HGBA1C 8.3 (A) 05/30/2023      The ASCVD Risk score (Arnett DK, et al., 2019) failed to calculate for the following reasons:   The 2019 ASCVD risk score is only valid for ages 35 to 14     Assessment & Plan:   Problem List Items Addressed This Visit   None  T1DM Patient presents with a history of uncontrolled T1DM with a prior A1c of*** in***.  Patient's A1c today is***.  They are on a regimen of***,***,***.  Patient***hypoglycemia.  Per review of CGM, average blood glucose is***.  Average fasting blood glucose is***. Plan: -Continue regimen of: -A1c today -Ophthalmology referral:  -Foot exam -Urine ACR    No follow-ups on file.    Damien Lease, DO

## 2023-08-10 ENCOUNTER — Observation Stay (HOSPITAL_COMMUNITY)
Admission: EM | Admit: 2023-08-10 | Discharge: 2023-08-11 | Disposition: A | Discharge status: Home / Self Care | Attending: Family Medicine | Admitting: Family Medicine

## 2023-08-10 ENCOUNTER — Emergency Department (HOSPITAL_COMMUNITY)

## 2023-08-10 ENCOUNTER — Other Ambulatory Visit: Payer: Self-pay

## 2023-08-10 ENCOUNTER — Encounter (HOSPITAL_COMMUNITY): Payer: Self-pay

## 2023-08-10 DIAGNOSIS — R112 Nausea with vomiting, unspecified: Principal | ICD-10-CM

## 2023-08-10 DIAGNOSIS — N179 Acute kidney failure, unspecified: Secondary | ICD-10-CM

## 2023-08-10 DIAGNOSIS — F1090 Alcohol use, unspecified, uncomplicated: Secondary | ICD-10-CM | POA: Diagnosis not present

## 2023-08-10 DIAGNOSIS — I10 Essential (primary) hypertension: Secondary | ICD-10-CM | POA: Diagnosis present

## 2023-08-10 DIAGNOSIS — Z1152 Encounter for screening for COVID-19: Secondary | ICD-10-CM | POA: Diagnosis not present

## 2023-08-10 DIAGNOSIS — F1290 Cannabis use, unspecified, uncomplicated: Secondary | ICD-10-CM | POA: Insufficient documentation

## 2023-08-10 DIAGNOSIS — K3184 Gastroparesis: Secondary | ICD-10-CM | POA: Diagnosis not present

## 2023-08-10 DIAGNOSIS — Z79899 Other long term (current) drug therapy: Secondary | ICD-10-CM | POA: Insufficient documentation

## 2023-08-10 DIAGNOSIS — N170 Acute kidney failure with tubular necrosis: Secondary | ICD-10-CM | POA: Diagnosis not present

## 2023-08-10 DIAGNOSIS — B349 Viral infection, unspecified: Secondary | ICD-10-CM | POA: Insufficient documentation

## 2023-08-10 DIAGNOSIS — E1043 Type 1 diabetes mellitus with diabetic autonomic (poly)neuropathy: Secondary | ICD-10-CM | POA: Insufficient documentation

## 2023-08-10 DIAGNOSIS — E876 Hypokalemia: Secondary | ICD-10-CM | POA: Insufficient documentation

## 2023-08-10 DIAGNOSIS — E1143 Type 2 diabetes mellitus with diabetic autonomic (poly)neuropathy: Secondary | ICD-10-CM | POA: Diagnosis present

## 2023-08-10 DIAGNOSIS — E109 Type 1 diabetes mellitus without complications: Secondary | ICD-10-CM | POA: Diagnosis present

## 2023-08-10 DIAGNOSIS — D72829 Elevated white blood cell count, unspecified: Secondary | ICD-10-CM | POA: Insufficient documentation

## 2023-08-10 DIAGNOSIS — E119 Type 2 diabetes mellitus without complications: Secondary | ICD-10-CM

## 2023-08-10 DIAGNOSIS — E860 Dehydration: Secondary | ICD-10-CM

## 2023-08-10 DIAGNOSIS — R110 Nausea: Secondary | ICD-10-CM | POA: Diagnosis present

## 2023-08-10 LAB — LIPASE, BLOOD: Lipase: 25 U/L (ref 11–51)

## 2023-08-10 LAB — COMPREHENSIVE METABOLIC PANEL
ALT: 45 U/L — ABNORMAL HIGH (ref 0–44)
AST: 31 U/L (ref 15–41)
Albumin: 4.3 g/dL (ref 3.5–5.0)
Alkaline Phosphatase: 86 U/L (ref 38–126)
Anion gap: 15 (ref 5–15)
BUN: 48 mg/dL — ABNORMAL HIGH (ref 6–20)
CO2: 22 mmol/L (ref 22–32)
Calcium: 9.5 mg/dL (ref 8.9–10.3)
Chloride: 101 mmol/L (ref 98–111)
Creatinine, Ser: 3.55 mg/dL — ABNORMAL HIGH (ref 0.61–1.24)
GFR, Estimated: 23 mL/min — ABNORMAL LOW (ref >60)
Glucose, Bld: 82 mg/dL (ref 70–99)
Potassium: 2.9 mmol/L — ABNORMAL LOW (ref 3.5–5.1)
Sodium: 138 mmol/L (ref 135–145)
Total Bilirubin: 0.8 mg/dL (ref 0.0–1.2)
Total Protein: 8.4 g/dL — ABNORMAL HIGH (ref 6.5–8.1)

## 2023-08-10 LAB — RESP PANEL BY RT-PCR (RSV, FLU A&B, COVID)  RVPGX2
Influenza A by PCR: NEGATIVE
Influenza B by PCR: NEGATIVE
Resp Syncytial Virus by PCR: NEGATIVE
SARS Coronavirus 2 by RT PCR: NEGATIVE

## 2023-08-10 LAB — CBC WITH DIFFERENTIAL/PLATELET
Abs Immature Granulocytes: 0.18 10*3/uL — ABNORMAL HIGH (ref 0.00–0.07)
Basophils Absolute: 0.1 10*3/uL (ref 0.0–0.1)
Basophils Relative: 0 %
Eosinophils Absolute: 0 10*3/uL (ref 0.0–0.5)
Eosinophils Relative: 0 %
HCT: 47.8 % (ref 39.0–52.0)
Hemoglobin: 16.3 g/dL (ref 13.0–17.0)
Immature Granulocytes: 1 %
Lymphocytes Relative: 16 %
Lymphs Abs: 3.9 10*3/uL (ref 0.7–4.0)
MCH: 29.9 pg (ref 26.0–34.0)
MCHC: 34.1 g/dL (ref 30.0–36.0)
MCV: 87.7 fL (ref 80.0–100.0)
Monocytes Absolute: 1.7 10*3/uL — ABNORMAL HIGH (ref 0.1–1.0)
Monocytes Relative: 7 %
Neutro Abs: 19.4 10*3/uL — ABNORMAL HIGH (ref 1.7–7.7)
Neutrophils Relative %: 76 %
Platelets: 357 10*3/uL (ref 150–400)
RBC: 5.45 MIL/uL (ref 4.22–5.81)
RDW: 13.9 % (ref 11.5–15.5)
WBC: 25.3 10*3/uL — ABNORMAL HIGH (ref 4.0–10.5)
nRBC: 0 % (ref 0.0–0.2)

## 2023-08-10 LAB — BETA-HYDROXYBUTYRIC ACID: Beta-Hydroxybutyric Acid: 1.12 mmol/L — ABNORMAL HIGH (ref 0.05–0.27)

## 2023-08-10 LAB — CBG MONITORING, ED
Glucose-Capillary: 60 mg/dL — ABNORMAL LOW (ref 70–99)
Glucose-Capillary: 131 mg/dL — ABNORMAL HIGH (ref 70–99)

## 2023-08-10 LAB — MAGNESIUM: Magnesium: 2.3 mg/dL (ref 1.7–2.4)

## 2023-08-10 LAB — GROUP A STREP BY PCR: Group A Strep by PCR: NOT DETECTED

## 2023-08-10 MED ORDER — POTASSIUM CHLORIDE 10 MEQ/100ML IV SOLN
10.0000 meq | INTRAVENOUS | Status: DC
  Administered 2023-08-10 (×2): 10 meq via INTRAVENOUS
  Filled 2023-08-10 (×2): qty 100

## 2023-08-10 MED ORDER — HEPARIN SODIUM (PORCINE) 5000 UNIT/ML IJ SOLN: 5000.0000 [IU] | Freq: Three times a day (TID) | INTRAMUSCULAR | Status: DC

## 2023-08-10 MED ORDER — ACETAMINOPHEN 650 MG RE SUPP: 650.0000 mg | Freq: Four times a day (QID) | RECTAL | Status: DC | PRN

## 2023-08-10 MED ORDER — FLUOXETINE HCL 10 MG PO CAPS
10.0000 mg | ORAL_CAPSULE | Freq: Every day | ORAL | Status: DC
  Administered 2023-08-11: 10 mg via ORAL
  Filled 2023-08-10: qty 1

## 2023-08-10 MED ORDER — ONDANSETRON HCL 4 MG PO TABS: 4.0000 mg | ORAL_TABLET | Freq: Four times a day (QID) | ORAL | Status: DC | PRN

## 2023-08-10 MED ORDER — AMLODIPINE BESYLATE 10 MG PO TABS
10.0000 mg | ORAL_TABLET | Freq: Every day | ORAL | Status: DC
  Administered 2023-08-11: 10 mg via ORAL
  Filled 2023-08-10: qty 1

## 2023-08-10 MED ORDER — SODIUM CHLORIDE 0.9 % IV SOLN: INTRAVENOUS | Status: DC

## 2023-08-10 MED ORDER — ACETAMINOPHEN 325 MG PO TABS: 650.0000 mg | ORAL_TABLET | Freq: Four times a day (QID) | ORAL | Status: DC | PRN

## 2023-08-10 MED ORDER — ONDANSETRON HCL 4 MG/2ML IJ SOLN
4.0000 mg | Freq: Once | INTRAMUSCULAR | Status: AC
  Administered 2023-08-10: 4 mg via INTRAVENOUS
  Filled 2023-08-10: qty 2

## 2023-08-10 MED ORDER — HYALURONIDASE HUMAN 150 UNIT/ML IJ SOLN
150.0000 [IU] | Freq: Once | INTRAMUSCULAR | Status: AC
  Administered 2023-08-10: 150 [IU] via SUBCUTANEOUS
  Filled 2023-08-10: qty 1

## 2023-08-10 MED ORDER — INSULIN PUMP
1.0000 | SUBCUTANEOUS | Status: DC
  Administered 2023-08-11: 1 via SUBCUTANEOUS
  Filled 2023-08-10: qty 1

## 2023-08-10 MED ORDER — POTASSIUM CHLORIDE CRYS ER 20 MEQ PO TBCR
20.0000 meq | EXTENDED_RELEASE_TABLET | Freq: Two times a day (BID) | ORAL | Status: DC
  Administered 2023-08-10 – 2023-08-11 (×2): 20 meq via ORAL
  Filled 2023-08-10 (×2): qty 1

## 2023-08-10 MED ORDER — PANTOPRAZOLE SODIUM 40 MG PO TBEC
40.0000 mg | DELAYED_RELEASE_TABLET | Freq: Every day | ORAL | Status: DC
  Administered 2023-08-11: 40 mg via ORAL
  Filled 2023-08-10: qty 1

## 2023-08-10 MED ORDER — LACTATED RINGERS IV BOLUS
1000.0000 mL | Freq: Once | INTRAVENOUS | Status: AC
  Administered 2023-08-10: 1000 mL via INTRAVENOUS

## 2023-08-10 MED ORDER — ONDANSETRON HCL 4 MG/2ML IJ SOLN: 4.0000 mg | Freq: Four times a day (QID) | INTRAMUSCULAR | Status: DC | PRN

## 2023-08-10 NOTE — ED Provider Notes (Signed)
 [place] EMERGENCY DEPARTMENT AT [facility] Provider Note   CSN: [id number] Arrival date & time: 08/10/23  1635     History  Chief Complaint  Patient presents with   URI    Type 1 diabetic     [facility] Mason Mclaughlin is a 28 y.o. male.  Pt with hx iddm, c/o nausea and vomiting for past three days. No bloody or bilious emesis. No abd pain or distension. Having normal bms. Notes hx gastroparesis, feels similar. Indicates compliant w diabetes meds/insulin pump. Denies change in meds or new meds. No known bad food ingestion or ill contacts. No fever or chills. +non prod cough and sore throat. No trouble breathing or swallowing. No specific ill contacts or known covid, strep, mono, or flu exposure. Denies chest pain or sob. No dysuria or gu c/o. No extremity pain or swelling.   The history is provided by the patient and medical records.  URI Presenting symptoms: cough and sore throat   Presenting symptoms: no fever   Associated symptoms: no headaches, no neck pain and no sinus pain        Home Medications Prior to Admission medications   Medication Sig Start Date End Date Taking? Authorizing Provider  amLODipine (NORVASC) 10 MG tablet Take 1 tablet (10 mg total) by mouth daily. 05/30/23 05/29/24  Champ Mungo, DO  amLODipine (NORVASC) 5 MG tablet Take 2 tablets (10 mg total) by mouth daily. 05/27/23   Standley Brooking, MD  chlorthalidone (HYGROTON) 25 MG tablet Take 1 tablet (25 mg total) by mouth daily. 05/27/23   Standley Brooking, MD  FLUoxetine (PROZAC) 10 MG capsule Take 1 capsule (10 mg total) by mouth daily. 03/30/23 09/26/23  Nooruddin, Jason Fila, MD  insulin aspart (NOVOLOG) 100 UNIT/ML FlexPen Inject 8-10 Units into the skin 3 (three) times daily with meals. 03/11/23   Nooruddin, Jason Fila, MD  insulin aspart (NOVOLOG) 100 UNIT/ML injection Please insert vial into insulin pump 03/11/23   Nooruddin, Saad, MD  Insulin Human (INSULIN PUMP) SOLN Inject 1 each into the skin  continuous. Medication: Novolog    [provider]  Insulin Pen Needle (PEN NEEDLES) 32G X 4 MM MISC Use as directed 03/11/23   Nooruddin, Jason Fila, MD  lisinopril (ZESTRIL) 40 MG tablet Take 1 tablet (40 mg total) by mouth daily. 05/30/23   Champ Mungo, DO  NOVOLOG RELION 100 UNIT/ML injection Inject 75 Units into the skin daily at 6 (six) AM. 05/08/23   [provider]  ondansetron (ZOFRAN) 4 MG tablet Take 1 tablet (4 mg total) by mouth every 8 (eight) hours as needed for up to 20 doses for nausea or vomiting. 08/20/22   Leatha Gilding, MD  pantoprazole (PROTONIX) 40 MG tablet Take 40 mg by mouth daily. 05/08/23   [provider]  potassium chloride SA (KLOR-CON M) 20 MEQ tablet Take 1 tablet (20 mEq total) by mouth 2 (two) times daily. 05/31/23   Champ Mungo, DO      Allergies    Patient has no known allergies.    Review of Systems   Review of Systems  Constitutional:  Negative for chills and fever.  HENT:  Positive for sore throat. Negative for sinus pain and trouble swallowing.   Eyes:  Negative for pain and redness.  Respiratory:  Positive for cough. Negative for shortness of breath.   Cardiovascular:  Negative for chest pain.  Gastrointestinal:  Positive for nausea and vomiting. Negative for abdominal pain, constipation and diarrhea.  Genitourinary:  Negative for dysuria and flank pain.  Musculoskeletal:  Negative for back pain and neck pain.  Skin:  Negative for rash.  Neurological:  Negative for weakness, numbness and headaches.    Physical Exam Updated Vital Signs BP 115/65   Pulse (!) 106   Temp 98.3 F (36.8 C) (Oral)   Resp 17   SpO2 99%  Physical Exam Vitals and nursing note reviewed.  Constitutional:      Appearance: Normal appearance. He is well-developed.  HENT:     Head: Atraumatic.     Comments: No sinus or temporal tenderness.     Right Ear: Tympanic membrane normal.     Left Ear: Tympanic membrane normal.     Nose: Nose normal.      Mouth/Throat:     Mouth: Mucous membranes are moist.     Pharynx: Oropharynx is clear.  Eyes:     General: No scleral icterus.    Conjunctiva/sclera: Conjunctivae normal.     Pupils: Pupils are equal, round, and reactive to light.  Neck:     Trachea: No tracheal deviation.     Comments: No stiffness or rigidity. Trachea midline. Thyroid not grossly enlarged or tender.  Cardiovascular:     Rate and Rhythm: Regular rhythm. Tachycardia present.     Pulses: Normal pulses.     Heart sounds: Normal heart sounds. No murmur heard.    No friction rub. No gallop.  Pulmonary:     Effort: Pulmonary effort is normal. No accessory muscle usage or respiratory distress.     Breath sounds: Normal breath sounds.  Abdominal:     General: Bowel sounds are normal. There is no distension.     Palpations: Abdomen is soft. There is no mass.     Tenderness: There is no abdominal tenderness. There is no guarding.  Genitourinary:    Comments: No cva tenderness. Musculoskeletal:        General: No swelling or tenderness.     Cervical back: Normal range of motion and neck supple. No rigidity or tenderness.     Right lower leg: No edema.     Left lower leg: No edema.  Skin:    General: Skin is warm and dry.     Findings: No rash.  Neurological:     Mental Status: He is alert.     Comments: Alert, speech clear. Motor/sens grossly intact bil.   Psychiatric:        Mood and Affect: Mood normal.     ED Results / Procedures / Treatments   Labs (all labs ordered are listed, but only abnormal results are displayed) Results for orders placed or performed during the hospital encounter of 08/10/23  CBG monitoring, ED   Collection Time: 08/10/23  4:51 PM  Result Value Ref Range   Glucose-Capillary 60 (L) 70 - 99 mg/dL  Resp panel by RT-PCR (RSV, Flu A&B, Covid) Anterior Nasal Swab   Collection Time: 08/10/23  5:15 PM   Specimen: Anterior Nasal Swab  Result Value Ref Range   SARS Coronavirus 2 by RT PCR  NEGATIVE NEGATIVE   Influenza A by PCR NEGATIVE NEGATIVE   Influenza B by PCR NEGATIVE NEGATIVE   Resp Syncytial Virus by PCR NEGATIVE NEGATIVE  CBC with Differential   Collection Time: 08/10/23  5:15 PM  Result Value Ref Range   WBC 25.3 (H) 4.0 - 10.5 K/uL   RBC 5.45 4.22 - 5.81 MIL/uL   Hemoglobin 16.3 13.0 - 17.0 g/dL  HCT 47.8 39.0 - 52.0 %   MCV 87.7 80.0 - 100.0 fL   MCH 29.9 26.0 - 34.0 pg   MCHC 34.1 30.0 - 36.0 g/dL   RDW 16.1 09.6 - 04.5 %   Platelets 357 150 - 400 K/uL   nRBC 0.0 0.0 - 0.2 %   Neutrophils Relative % 76 %   Neutro Abs 19.4 (H) 1.7 - 7.7 K/uL   Lymphocytes Relative 16 %   Lymphs Abs 3.9 0.7 - 4.0 K/uL   Monocytes Relative 7 %   Monocytes Absolute 1.7 (H) 0.1 - 1.0 K/uL   Eosinophils Relative 0 %   Eosinophils Absolute 0.0 0.0 - 0.5 K/uL   Basophils Relative 0 %   Basophils Absolute 0.1 0.0 - 0.1 K/uL   Immature Granulocytes 1 %   Abs Immature Granulocytes 0.18 (H) 0.00 - 0.07 K/uL  Comprehensive metabolic panel   Collection Time: 08/10/23  5:15 PM  Result Value Ref Range   Sodium 138 135 - 145 mmol/L   Potassium 2.9 (L) 3.5 - 5.1 mmol/L   Chloride 101 98 - 111 mmol/L   CO2 22 22 - 32 mmol/L   Glucose, Bld 82 70 - 99 mg/dL   BUN 48 (H) 6 - 20 mg/dL   Creatinine, Ser 4.09 (H) 0.61 - 1.24 mg/dL   Calcium 9.5 8.9 - 81.1 mg/dL   Total Protein 8.4 (H) 6.5 - 8.1 g/dL   Albumin 4.3 3.5 - 5.0 g/dL   AST 31 15 - 41 U/L   ALT 45 (H) 0 - 44 U/L   Alkaline Phosphatase 86 38 - 126 U/L   Total Bilirubin 0.8 0.0 - 1.2 mg/dL   GFR, Estimated 23 (L) >60 mL/min   Anion gap 15 5 - 15  Lipase, blood   Collection Time: 08/10/23  5:15 PM  Result Value Ref Range   Lipase 25 11 - 51 U/L  Beta-hydroxybutyric acid   Collection Time: 08/10/23  5:15 PM  Result Value Ref Range   Beta-Hydroxybutyric Acid 1.12 (H) 0.05 - 0.27 mmol/L  Magnesium   Collection Time: 08/10/23  5:15 PM  Result Value Ref Range   Magnesium 2.3 1.7 - 2.4 mg/dL  Group A Strep by PCR    Collection Time: 08/10/23  5:29 PM   Specimen: Throat; Sterile Swab  Result Value Ref Range   Group A Strep by PCR NOT DETECTED NOT DETECTED    EKG EKG Interpretation Date/Time:  Wednesday August 10 2023 16:57:34 EDT Ventricular Rate:  158 PR Interval:  92 QRS Duration:  69 QT Interval:  334 QTC Calculation: 542 R Axis:   60  Text Interpretation: Narrow QRS tachycardia Non-specific ST-t changes Confirmed by Cathren Laine ([id number]) on 08/10/2023 5:13:08 PM  Radiology DG Chest Port 1 View Result Date: 08/10/2023 CLINICAL DATA:  Cough and weakness.  Nausea and vomiting. EXAM: PORTABLE CHEST 1 VIEW COMPARISON:  Chest x-ray dated December 31, 2022. FINDINGS: The heart size and mediastinal contours are within normal limits. Both lungs are clear. The visualized skeletal structures are unremarkable. IMPRESSION: No active disease. Electronically Signed   By: Obie Dredge M.D.   On: 08/10/2023 18:21    Procedures Procedures    Medications Ordered in ED Medications  potassium chloride 10 mEq in 100 mL IVPB (10 mEq Intravenous New Bag/Given 08/10/23 1828)  lactated ringers bolus 1,000 mL (0 mLs Intravenous Stopped 08/10/23 1832)  ondansetron (ZOFRAN) injection 4 mg (4 mg Intravenous Given 08/10/23 1723)  lactated ringers bolus 1,000  mL (1,000 mLs Intravenous New Bag/Given 08/10/23 1811)    ED Course/ Medical Decision Making/ A&P                                 Medical Decision Making Problems Addressed: AKI (acute kidney injury) [facility]): acute illness or injury with systemic symptoms that poses a threat to life or bodily functions Dehydration: acute illness or injury with systemic symptoms that poses a threat to life or bodily functions Diabetic gastroparesis associated with type 1 diabetes mellitus (HCC): acute illness or injury with systemic symptoms that poses a threat to life or bodily functions    Details: Acute on chronic Hypokalemia: acute illness or injury Intractable nausea and  vomiting: acute illness or injury with systemic symptoms that poses a threat to life or bodily functions  Amount and/or Complexity of Data Reviewed External Data Reviewed: labs, radiology and notes. Labs: ordered. Decision-making details documented in ED Course. Radiology: ordered and independent interpretation performed. Decision-making details documented in ED Course. ECG/medicine tests: ordered and independent interpretation performed. Decision-making details documented in ED Course. Discussion of management or test interpretation with external provider(s): medicine  Risk Prescription drug management. Decision regarding hospitalization.   Iv ns. Continuous pulse ox and cardiac monitoring. Labs ordered/sent. Imaging ordered.   Differential diagnosis includes dka, dehydration, GE, aki, etc. Dispo decision including potential need for admission considered - will get labs and imaging and reassess.   Reviewed nursing notes and prior charts for additional history. External reports reviewed.   LR bolus. Zofran iv.   Cardiac monitor: sinus rhythm, rate 120.  Labs reviewed/interpreted by me - aki. Glucose normal. Wbc elevated. Denies fever/chills/sweats. Pt w multiple, multiple episodes nv.  Ag normal.   Xrays reviewed/interpreted by me - no pna.   Recheck nv improved. Abd soft non tender. No abd pain. Denies fever/chills.   Suspect volume depletion/dehydration.   Additional ivf bolus.  Hospitalists consulted for admission.  CRITICAL CARE  RE: intractable nausea/vomiting, with dehydration, significant AKI, tachycardia/hr 150, hypokalemia w parenteral replacement  Performed by: Suzi Roots Total critical care time: 45 minutes Critical care time was exclusive of separately billable procedures and treating other patients. Critical care was necessary to treat or prevent imminent or life-threatening deterioration. Critical care was time spent personally by me on the following  activities: development of treatment plan with patient and/or surrogate as well as nursing, discussions with consultants, evaluation of patient's response to treatment, examination of patient, obtaining history from patient or surrogate, ordering and performing treatments and interventions, ordering and review of laboratory studies, ordering and review of radiographic studies, pulse oximetry and re-evaluation of patient's condition.           Final Clinical Impression(s) / ED Diagnoses Final diagnoses:  None    Rx / DC Orders ED Discharge Orders     None         Cathren Laine, MD 08/10/23 [phone]

## 2023-08-10 NOTE — ED Notes (Signed)
 [place] PIV placed.

## 2023-08-10 NOTE — ED Triage Notes (Signed)
 Pt states he has been feeling bad since Sunday, with upper respiratory symptoms and nausea and vomiting. Pt states his sugars have been in the 100s, upon triage pt bg is 60, pt lethargic and tachycardic,

## 2023-08-10 NOTE — H&P (Signed)
 History and Physical    Mason Mclaughlin VWU:[id number] DOB: 07/16/1995 DOA: 08/10/2023  PCP: Olegario Messier, MD  Patient coming from: Home.  I have personally briefly reviewed patient's old medical records available.   Chief Complaint: nausea , vomiting , feeling ill . "I might have flu"  HPI: Mason Mclaughlin is a 28 y.o. male with medical history significant of type 1 diabetes on insulin pump, hypertension, poorly controlled diabetes with diabetic gastroparesis and recurrent hospitalization due to nausea vomiting who presents with about 3 days of nausea, intermittent vomiting that has already improved however he felt flulike symptoms.  Feeling sick.  No fever or chills.  Patient tells me since yesterday he was able to drink and eat.  He felt dry cough and some sore throat so came to the ER.  Patient denied any headache, blurry vision, syncopal episodes.  He denied any chest pain or shortness of breath.  He just had dry cough.  Denies any fever or chills at home.  Denies any abdominal pain.  Bowel habits are normal.  Patient tells me he is urinating normally. ED Course: On room air.  Sinus tachycardia.  WC count 25.3, recent WC count 17.  Potassium 2.9 with chronic hypokalemia.  BUN and creatinine 48/3.55 with recent creatinine of 0.9.  Patient was given 2 L normal saline bolus.  He was also given IV potassium that is infiltrated in his right arm.  Admission requested due to significant abnormal renal functions.  Review of Systems: all systems are reviewed and pertinent positive as per HPI otherwise rest are negative.    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 No pertinent surgical history.  Social history   reports that he has never smoked. He has never used smokeless tobacco. He reports current alcohol use. He reports current drug use. Frequency: 3.00 times per week. Drug: Marijuana.  No Known Allergies  Family History  Problem Relation Age of Onset   Healthy Mother    Diabetes Father      Prior to Admission medications   Medication Sig Start Date End Date Taking? Authorizing Provider  amLODipine (NORVASC) 10 MG tablet Take 1 tablet (10 mg total) by mouth daily. 05/30/23 05/29/24  Champ Mungo, DO  amLODipine (NORVASC) 5 MG tablet Take 2 tablets (10 mg total) by mouth daily. 05/27/23   Standley Brooking, MD  chlorthalidone (HYGROTON) 25 MG tablet Take 1 tablet (25 mg total) by mouth daily. 05/27/23   Standley Brooking, MD  FLUoxetine (PROZAC) 10 MG capsule Take 1 capsule (10 mg total) by mouth daily. 03/30/23 09/26/23  Nooruddin, Jason Fila, MD  insulin aspart (NOVOLOG) 100 UNIT/ML FlexPen Inject 8-10 Units into the skin 3 (three) times daily with meals. 03/11/23   Nooruddin, Jason Fila, MD  insulin aspart (NOVOLOG) 100 UNIT/ML injection Please insert vial into insulin pump 03/11/23   Nooruddin, Saad, MD  Insulin Human (INSULIN PUMP) SOLN Inject 1 each into the skin continuous. Medication: Novolog    [provider]  Insulin Pen Needle (PEN NEEDLES) 32G X  4 MM MISC Use as directed 03/11/23   Nooruddin, Jason Fila, MD  lisinopril (ZESTRIL) 40 MG tablet Take 1 tablet (40 mg total) by mouth daily. 05/30/23   Champ Mungo, DO  NOVOLOG RELION 100 UNIT/ML injection Inject 75 Units into the skin daily at 6 (six) AM. 05/08/23   [provider]  ondansetron (ZOFRAN) 4 MG tablet Take 1 tablet (4 mg total) by mouth every 8 (eight) hours as needed for up to 20 doses for nausea or vomiting. 08/20/22   Leatha Gilding, MD  pantoprazole (PROTONIX) 40 MG tablet Take 40 mg by mouth daily.  05/08/23   [provider]  potassium chloride SA (KLOR-CON M) 20 MEQ tablet Take 1 tablet (20 mEq total) by mouth 2 (two) times daily. 05/31/23   Champ Mungo, DO    Physical Exam: Vitals:   08/10/23 1657 08/10/23 1710 08/10/23 1800  BP: 100/70 108/69 115/65  Pulse: (!) 157 (!) 114 (!) 106  Resp: (!) 22 18 17   Temp: 98.3 F (36.8 C)    TempSrc: Oral    SpO2: 100% 94% 99%    Constitutional: NAD, calm, comfortable.  Pleasant and interactive. Vitals:   08/10/23 1657 08/10/23 1710 08/10/23 1800  BP: 100/70 108/69 115/65  Pulse: (!) 157 (!) 114 (!) 106  Resp: (!) 22 18 17   Temp: 98.3 F (36.8 C)    TempSrc: Oral    SpO2: 100% 94% 99%   Eyes: PERRL, lids and conjunctivae normal ENMT: Mucous membranes are dry.  Posterior pharynx clear of any exudate or lesions.Normal dentition.  Neck: normal, supple, no masses, no thyromegaly Respiratory: clear to auscultation bilaterally, no wheezing, no crackles. Normal respiratory effort. No accessory muscle use.  Cardiovascular: Regular rate and rhythm, tachycardic.  No murmurs / rubs / gallops. No extremity edema. 2+ pedal pulses. No carotid bruits.  Abdomen: no tenderness, no masses palpated. No hepatosplenomegaly. Bowel sounds positive.  Musculoskeletal: no clubbing / cyanosis. No joint deformity upper and lower extremities. Good ROM, no contractures. Normal muscle tone.  Skin: no rashes, lesions, ulcers. No induration Neurologic: CN 2-12 grossly intact. Sensation intact, DTR normal. Strength 5/5 in all 4.  Psychiatric: Normal judgment and insight. Alert and oriented x 3. Normal mood.   Patient's right arm is swollen and edematous with IV line running.  Discontinued and notified the bedside RN.  Labs on Admission: I have personally reviewed following labs and imaging studies  CBC: Recent Labs  Lab 08/10/23 1715  WBC 25.3*  NEUTROABS 19.4*  HGB 16.3  HCT 47.8  MCV 87.7  PLT 357   Basic Metabolic Panel: Recent Labs  Lab  08/10/23 1715  NA 138  K 2.9*  CL 101  CO2 22  GLUCOSE 82  BUN 48*  CREATININE 3.55*  CALCIUM 9.5  MG 2.3   GFR: CrCl cannot be calculated (Unknown ideal weight.). Liver Function Tests: Recent Labs  Lab 08/10/23 1715  AST 31  ALT 45*  ALKPHOS 86  BILITOT 0.8  PROT 8.4*  ALBUMIN 4.3   Recent Labs  Lab 08/10/23 1715  LIPASE 25   No results for input(s): "AMMONIA" in the last 168 hours. Coagulation Profile: No results for input(s): "INR", "PROTIME" in the last 168 hours. Cardiac Enzymes: No results for input(s): "CKTOTAL", "CKMB", "CKMBINDEX", "TROPONINI" in the last 168 hours. BNP (last 3 results) No results for input(s): "PROBNP" in the last 8760 hours. HbA1C: No results for input(s): "HGBA1C" in the last 72 hours. CBG: Recent Labs  Lab 08/10/23  1651  GLUCAP 60*   Lipid Profile: No results for input(s): "CHOL", "HDL", "LDLCALC", "TRIG", "CHOLHDL", "LDLDIRECT" in the last 72 hours. Thyroid Function Tests: No results for input(s): "TSH", "T4TOTAL", "FREET4", "T3FREE", "THYROIDAB" in the last 72 hours. Anemia Panel: No results for input(s): "VITAMINB12", "FOLATE", "FERRITIN", "TIBC", "IRON", "RETICCTPCT" in the last 72 hours. Urine analysis:    Component Value Date/Time   COLORURINE COLORLESS (A) 05/25/2023 0249   APPEARANCEUR CLEAR 05/25/2023 0249   LABSPEC 1.015 05/25/2023 0249   PHURINE 5.0 05/25/2023 0249   GLUCOSEU >=500 (A) 05/25/2023 0249   HGBUR SMALL (A) 05/25/2023 0249   BILIRUBINUR NEGATIVE 05/25/2023 0249   KETONESUR 80 (A) 05/25/2023 0249   PROTEINUR NEGATIVE 05/25/2023 0249   NITRITE NEGATIVE 05/25/2023 0249   LEUKOCYTESUR NEGATIVE 05/25/2023 0249    Radiological Exams on Admission: DG Chest Port 1 View Result Date: 08/10/2023 CLINICAL DATA:  Cough and weakness.  Nausea and vomiting. EXAM: PORTABLE CHEST 1 VIEW COMPARISON:  Chest x-ray dated December 31, 2022. FINDINGS: The heart size and mediastinal contours are within normal limits. Both  lungs are clear. The visualized skeletal structures are unremarkable. IMPRESSION: No active disease. Electronically Signed   By: Obie Dredge M.D.   On: 08/10/2023 18:21    EKG: Independently reviewed.  Sinus tachycardia.  Assessment/Plan Principal Problem:   Acute kidney injury (AKI) with acute tubular necrosis (ATN) (HCC) Active Problems:   Hypertension complicating diabetes (HCC)   Type 1 diabetes mellitus not at goal [facility])   Diabetic gastroparesis (HCC)   Benign essential HTN     1.  Acute kidney injury with ATN: Multifactorial.  Clinical dehydration. Patient with history of gastroparesis, poor oral intake and viral syndrome.  He is also on ACE inhibitor and diuretics.  Patient is making urine. 2 L isotonic fluid given in the ER with improvement of symptoms.  Continue maintenance IV fluids overnight.  Strict intake and output monitoring.  If no appropriate response on repeat exam, will need further serological testing however patient would likely respond to IV fluids. Holding ACE inhibitors and diuretics for the time being. Patient is able to take oral liquids, encourage oral intake.  2.  Hypokalemia: Acute on chronic.  Continue aggressive replacement and monitor levels.  Will check magnesium in the morning.  3.  Type 1 diabetes on insulin pump: Blood sugars are well-controlled and was hypoglycemic at home.  Currently stabilized.  Latest blood sugar is 80. Patient is alert awake and able to manage his insulin pump.  He will continue to use insulin pump.  4.  Essential hypertension: Blood pressure is stable.  Continue amlodipine but hold lisinopril and chlorthalidone due to AKI.  5.  IV infiltration right arm: Ice compression.  Change IV line.  6.  Leukocytosis: Patient has chronic leukocytosis.  Previous WBC count 16-18,000.  Likely precipitated by dehydration.  7.  Viral syndrome: COVID, flu and RSV negative.  No fever.  This is likely due to AKI.  DVT prophylaxis: Heparin  subcu Code Status: Full code Family Communication: None at the bedside Disposition Plan: Home when stable Consults called: None Admission status: Observation, telemetry monitor   Dorcas Carrow MD [facility] Pager [phone]

## 2023-08-10 NOTE — ED Notes (Signed)
 Cold compresses applied to pt right arm, IV has been removed.

## 2023-08-11 DIAGNOSIS — N170 Acute kidney failure with tubular necrosis: Secondary | ICD-10-CM | POA: Diagnosis not present

## 2023-08-11 LAB — GLUCOSE, CAPILLARY
Glucose-Capillary: 123 mg/dL — ABNORMAL HIGH (ref 70–99)
Glucose-Capillary: 113 mg/dL — ABNORMAL HIGH (ref 70–99)
Glucose-Capillary: 139 mg/dL — ABNORMAL HIGH (ref 70–99)
Glucose-Capillary: 155 mg/dL — ABNORMAL HIGH (ref 70–99)

## 2023-08-11 LAB — URINALYSIS, ROUTINE W REFLEX MICROSCOPIC
Bilirubin Urine: NEGATIVE
Glucose, UA: NEGATIVE mg/dL
Hgb urine dipstick: NEGATIVE
Ketones, ur: 5 mg/dL — AB
Leukocytes,Ua: NEGATIVE
Nitrite: NEGATIVE
Protein, ur: NEGATIVE mg/dL
Specific Gravity, Urine: 1.018 (ref 1.005–1.030)
pH: 6 (ref 5.0–8.0)

## 2023-08-11 LAB — CBC
HCT: 46.7 % (ref 39.0–52.0)
Hemoglobin: 15.8 g/dL (ref 13.0–17.0)
MCH: 30.7 pg (ref 26.0–34.0)
MCHC: 33.8 g/dL (ref 30.0–36.0)
MCV: 90.9 fL (ref 80.0–100.0)
Platelets: 210 10*3/uL (ref 150–400)
RBC: 5.14 MIL/uL (ref 4.22–5.81)
RDW: 13.9 % (ref 11.5–15.5)
WBC: 13.9 10*3/uL — ABNORMAL HIGH (ref 4.0–10.5)
nRBC: 0 % (ref 0.0–0.2)

## 2023-08-11 LAB — BASIC METABOLIC PANEL
Anion gap: 20 — ABNORMAL HIGH (ref 5–15)
BUN: 29 mg/dL — ABNORMAL HIGH (ref 6–20)
CO2: 15 mmol/L — ABNORMAL LOW (ref 22–32)
Calcium: 9.2 mg/dL (ref 8.9–10.3)
Chloride: 106 mmol/L (ref 98–111)
Creatinine, Ser: 0.87 mg/dL (ref 0.61–1.24)
GFR, Estimated: >60 mL/min (ref >60)
Glucose, Bld: 146 mg/dL — ABNORMAL HIGH (ref 70–99)
Potassium: 4 mmol/L (ref 3.5–5.1)
Sodium: 141 mmol/L (ref 135–145)

## 2023-08-11 LAB — RAPID URINE DRUG SCREEN, HOSP PERFORMED
Amphetamines: NOT DETECTED
Barbiturates: NOT DETECTED
Benzodiazepines: NOT DETECTED
Cocaine: NOT DETECTED
Opiates: NOT DETECTED
Tetrahydrocannabinol: POSITIVE — AB

## 2023-08-11 MED ORDER — PNEUMOCOCCAL 20-VAL CONJ VACC 0.5 ML IM SUSY: 0.5000 mL | PREFILLED_SYRINGE | INTRAMUSCULAR | Status: DC

## 2023-08-11 MED ORDER — PHENOL 1.4 % MT LIQD
1.0000 | OROMUCOSAL | Status: DC | PRN
  Administered 2023-08-11: 1 via OROMUCOSAL
  Filled 2023-08-11: qty 177

## 2023-08-11 MED ORDER — INFLUENZA VIRUS VACC SPLIT PF (FLUZONE) 0.5 ML IM SUSY: 0.5000 mL | PREFILLED_SYRINGE | INTRAMUSCULAR | Status: DC

## 2023-08-11 NOTE — Progress Notes (Signed)
   08/11/23 1115  TOC Brief Assessment  Insurance and Status Reviewed  Patient has primary care physician Yes  Home environment has been reviewed Resides alone in an apartment  Prior level of function: Independent with ADLs at baseline  Prior/Current Home Services No current home services  Social Drivers of Health Review SDOH reviewed no interventions necessary  Readmission risk has been reviewed Yes  Transition of care needs no transition of care needs at this time

## 2023-08-11 NOTE — Inpatient Diabetes Management (Signed)
 Inpatient Diabetes Program Recommendations  AACE/ADA: New Consensus Statement on Inpatient Glycemic Control (2015)  Target Ranges:  Prepandial:   less than 140 mg/dL      Peak postprandial:   less than 180 mg/dL (1-2 hours)      Critically ill patients:  140 - 180 mg/dL   Lab Results  Component Value Date   GLUCAP 139 (H) 08/11/2023   HGBA1C 8.3 (A) 05/30/2023    Review of Glycemic Control  Diabetes history: DM1 Outpatient Diabetes medications: Insulin pump with Dexcom Current orders for Inpatient glycemic control: Insulin pump  Inpatient Diabetes Program Recommendations:    Spoke with pt at bedside regarding his glucose control and insulin pump. Pt states he has been exercising and taking care of himself in the last couple of months. Uses Dexcom and has been bolusing with his meals. Occasional hypoglycemia and treats with 15 g carbohydrate. Has no issues with obtaining pump supplies and last appt with Endo was 05/30/23. Could not pull up Dexcom as phone was not charged. Awaiting BMET results prior to discharge.  Answered all questions.  CBGs today: 123, 113, 139.  Discussed with Dr Maryfrances Bunnell.  Thank you. Ailene Ards, RD, LDN, CDCES Inpatient Diabetes Coordinator [phone]

## 2023-08-11 NOTE — Hospital Course (Signed)
 28 y.o. M with T1DM, HTN, frequent DKA, gastroparesis flares, who presented with flu-like symptoms.

## 2023-08-11 NOTE — Plan of Care (Signed)
 Discharge instructions given, pt verbalized understanding IV dc'd Pt has all belongings   Problem: Health Behavior/Discharge Planning: Goal: Ability to manage health-related needs will improve Outcome: Completed/Met   Problem: Clinical Measurements: Goal: Will remain free from infection Outcome: Completed/Met   Problem: Clinical Measurements: Goal: Cardiovascular complication will be avoided Outcome: Completed/Met

## 2023-08-11 NOTE — Discharge Summary (Signed)
 Physician Discharge Summary   Patient: Mason Mclaughlin MRN: [id number] DOB: 30-Mar-1996  Admit date:     08/10/2023  Discharge date: 08/11/23  Discharge Physician: Alberteen Sam   PCP: Olegario Messier, MD     Recommendations at discharge:  Follow up with PCP Dr. Thomasene Ripple in 1 week for AKI Dr. Nooruddin: Please repeat BMP in 5 days and resume lisinopril/Chlorthalidone as appropriate     Discharge Diagnoses: Principal Problem:   Acute kidney injury (AKI) with acute tubular necrosis (ATN) (HCC) Active Problems:   Hypertension complicating diabetes (HCC)   Type 1 diabetes mellitus not at goal [facility])   Diabetic gastroparesis (HCC)   Benign essential HTN         Hospital Course: 28 y.o. M with T1DM, HTN, frequent DKA, gastroparesis flares, who presented with flu-like symptoms.  In the ER, glucose was normal but Cr was 3.55 mg/dL and he had ketosis.  Glucose and AG normal, patient reported heavier than normal EtOH use that week.     AKI Likely from dehydration in setting of lisinorpil and chlorthalidone.  These were held, he was given IV Fluids  Urine output was good, and repeat Cr improved to baseline.  Recommend holding lisinopril and chlorthalidone at discharge, abstaining from alcohol and close follow up with PCP for repeat BMP            The [facility] Controlled Substances Registry was reviewed for this patient prior to discharge.  Consultants: None Procedures performed: None  Disposition: Home Diet recommendation:  Discharge Diet Orders (From admission, onward)     Start     Ordered   08/11/23 0000  Diet - low sodium heart healthy        08/11/23 1821             DISCHARGE MEDICATION: Allergies as of 08/11/2023   No Known Allergies      Medication List     PAUSE taking these medications    chlorthalidone 25 MG tablet Wait to take this until your doctor or other care provider tells you to start  again. Commonly known as: HYGROTON Take 1 tablet (25 mg total) by mouth daily.   lisinopril 40 MG tablet Wait to take this until your doctor or other care provider tells you to start again. Commonly known as: ZESTRIL Take 1 tablet (40 mg total) by mouth daily.   potassium chloride SA 20 MEQ tablet Wait to take this until your doctor or other care provider tells you to start again. Commonly known as: KLOR-CON M Take 1 tablet (20 mEq total) by mouth 2 (two) times daily.       TAKE these medications    amLODipine 5 MG tablet Commonly known as: NORVASC Take 2 tablets (10 mg total) by mouth daily.   Dexcom G7 Sensor Misc Use 1 each every 10 (ten) days for 90 days   FLUoxetine 10 MG capsule Commonly known as: PROZAC Take 1 capsule (10 mg total) by mouth daily. What changed:  when to take this reasons to take this   Insupen Pen Needles 32G X 4 MM Misc Generic drug: Insulin Pen Needle Use as directed   NovoLOG 100 UNIT/ML injection Generic drug: insulin aspart Please insert vial into insulin pump   ondansetron 4 MG disintegrating tablet Commonly known as: ZOFRAN-ODT Take 4 mg by mouth every 8 (eight) hours as needed for nausea or vomiting.   ondansetron 4 MG tablet Commonly known as: ZOFRAN Take 1 tablet (4  mg total) by mouth every 8 (eight) hours as needed for up to 20 doses for nausea or vomiting.   pantoprazole 40 MG tablet Commonly known as: PROTONIX Take 40 mg by mouth daily.        Follow-up Information     Nooruddin, Saad, MD. Schedule an appointment as soon as possible for a visit in 1 week(s).   Specialty: Internal Medicine Contact information: [address] [place] [address] [phone]                 Discharge Instructions     Diet - low sodium heart healthy   Complete by: As directed    Discharge instructions   Complete by: As directed    **IMPORTANT DISCHARGE INSTRUCTIONS**   From Dr. Maryfrances Bunnell:  You were evaluated for flu  like symptoms  Here, we found that this was probably from ketosis (a mild episode of DKA), dehydration and kidney injury  You were found to have a creatinine of 3.5 mg/dL (your normal is around 0.9 mg/dL)  You were treated with fluids and this creatinine improved  Drink PLENTY of fluids in the next week  Go see your primary Monday or Tuesday for a recheck of your blood work This can be a labs only appointment  IMPORTANT: until you see them and check your kidney function again, do NOT take your lisinopril or chlorthalidone  Resume all of your other medicines though   Increase activity slowly   Complete by: As directed        Discharge Exam: Filed Weights   08/11/23 0003  Weight: 70 kg    General: Pt is alert, awake, not in acute distress Cardiovascular: RRR, nl S1-S2, no murmurs appreciated.   No LE edema.   Respiratory: Normal respiratory rate and rhythm.  CTAB without rales or wheezes. Abdominal: Abdomen soft and non-tender.  No distension or HSM.   Neuro/Psych: Strength symmetric in upper and lower extremities.  Judgment and insight appear normal.   Condition at discharge: good  The results of significant diagnostics from this hospitalization (including imaging, microbiology, ancillary and laboratory) are listed below for reference.   Imaging Studies: DG Chest Port 1 View Result Date: 08/10/2023 CLINICAL DATA:  Cough and weakness.  Nausea and vomiting. EXAM: PORTABLE CHEST 1 VIEW COMPARISON:  Chest x-ray dated December 31, 2022. FINDINGS: The heart size and mediastinal contours are within normal limits. Both lungs are clear. The visualized skeletal structures are unremarkable. IMPRESSION: No active disease. Electronically Signed   By: Obie Dredge M.D.   On: 08/10/2023 18:21    Microbiology: Results for orders placed or performed during the hospital encounter of 08/10/23  Resp panel by RT-PCR (RSV, Flu A&B, Covid) Anterior Nasal Swab     Status: None   Collection  Time: 08/10/23  5:15 PM   Specimen: Anterior Nasal Swab  Result Value Ref Range Status   SARS Coronavirus 2 by RT PCR NEGATIVE NEGATIVE Final    Comment: (NOTE) SARS-CoV-2 target nucleic acids are NOT DETECTED.  The SARS-CoV-2 RNA is generally detectable in upper respiratory specimens during the acute phase of infection. The lowest concentration of SARS-CoV-2 viral copies this assay can detect is 138 copies/mL. A negative result does not preclude SARS-Cov-2 infection and should not be used as the sole basis for treatment or other patient management decisions. A negative result may occur with  improper specimen collection/handling, submission of specimen other than nasopharyngeal swab, presence of viral mutation(s) within the areas targeted  by this assay, and inadequate number of viral copies(<138 copies/mL). A negative result must be combined with clinical observations, patient history, and epidemiological information. The expected result is Negative.  Fact Sheet for Patients:  BloggerCourse.com  Fact Sheet for Healthcare Providers:  SeriousBroker.it  This test is no t yet approved or cleared by the [place] FDA and  has been authorized for detection and/or diagnosis of SARS-CoV-2 by FDA under an Emergency Use Authorization (EUA). This EUA will remain  in effect (meaning this test can be used) for the duration of the COVID-19 declaration under Section 564(b)(1) of the Act, 21 U.S.C.section 360bbb-3(b)(1), unless the authorization is terminated  or revoked sooner.       Influenza A by PCR NEGATIVE NEGATIVE Final   Influenza B by PCR NEGATIVE NEGATIVE Final    Comment: (NOTE) The Xpert Xpress SARS-CoV-2/FLU/RSV plus assay is intended as an aid in the diagnosis of influenza from Nasopharyngeal swab specimens and should not be used as a sole basis for treatment. Nasal washings and aspirates are unacceptable for Xpert Xpress  SARS-CoV-2/FLU/RSV testing.  Fact Sheet for Patients: BloggerCourse.com  Fact Sheet for Healthcare Providers: SeriousBroker.it  This test is not yet approved or cleared by the [place] FDA and has been authorized for detection and/or diagnosis of SARS-CoV-2 by FDA under an Emergency Use Authorization (EUA). This EUA will remain in effect (meaning this test can be used) for the duration of the COVID-19 declaration under Section 564(b)(1) of the Act, 21 U.S.C. section 360bbb-3(b)(1), unless the authorization is terminated or revoked.     Resp Syncytial Virus by PCR NEGATIVE NEGATIVE Final    Comment: (NOTE) Fact Sheet for Patients: BloggerCourse.com  Fact Sheet for Healthcare Providers: SeriousBroker.it  This test is not yet approved or cleared by the [place] FDA and has been authorized for detection and/or diagnosis of SARS-CoV-2 by FDA under an Emergency Use Authorization (EUA). This EUA will remain in effect (meaning this test can be used) for the duration of the COVID-19 declaration under Section 564(b)(1) of the Act, 21 U.S.C. section 360bbb-3(b)(1), unless the authorization is terminated or revoked.  Performed at [facility], [address] [place], [place] [address]   Group A Strep by PCR     Status: None   Collection Time: 08/10/23  5:29 PM   Specimen: Throat; Sterile Swab  Result Value Ref Range Status   Group A Strep by PCR NOT DETECTED NOT DETECTED Final    Comment: Performed at [facility], [address], [place], [place] [address]  Culture, blood (single) w Reflex to ID Panel     Status: None (Preliminary result)   Collection Time: 08/11/23 11:06 AM   Specimen: BLOOD RIGHT HAND  Result Value Ref Range Status   Specimen Description   Final    BLOOD RIGHT HAND Performed at [facility], [address], [place], [place] [address]    Special Requests   Final    BOTTLES DRAWN AEROBIC ONLY Blood Culture results may not be optimal due to an inadequate volume of blood received in culture bottles Performed at [facility], [address] [place], [place] [address]    Culture PENDING  Incomplete   Report Status PENDING  Incomplete    Labs: CBC: Recent Labs  Lab 08/10/23 1715 08/11/23 1106  WBC 25.3* 13.9*  NEUTROABS 19.4*  --   HGB 16.3 15.8  HCT 47.8 46.7  MCV 87.7 90.9  PLT  357 210   Basic Metabolic Panel: Recent Labs  Lab 08/10/23 1715  NA 138  K 2.9*  CL 101  CO2 22  GLUCOSE 82  BUN 48*  CREATININE 3.55*  CALCIUM 9.5  MG 2.3   Liver Function Tests: Recent Labs  Lab 08/10/23 1715  AST 31  ALT 45*  ALKPHOS 86  BILITOT 0.8  PROT 8.4*  ALBUMIN 4.3   CBG: Recent Labs  Lab 08/10/23 2033 08/11/23 0248 08/11/23 0734 08/11/23 1107 08/11/23 1625  GLUCAP 131* 123* 113* 139* 155*    Discharge time spent: approximately 45 minutes spent on discharge counseling, evaluation of patient on day of discharge, and coordination of discharge planning with nursing, social work, pharmacy and case management  Signed: Alberteen Sam, MD [facility] 08/11/2023

## 2023-08-16 LAB — CULTURE, BLOOD (SINGLE): Culture: NO GROWTH

## 2023-08-18 ENCOUNTER — Encounter: Admitting: Student

## 2023-08-24 ENCOUNTER — Ambulatory Visit
Admission: EM | Admit: 2023-08-24 | Discharge: 2023-08-24 | Disposition: A | Discharge status: Home / Self Care | Attending: Internal Medicine | Admitting: Internal Medicine

## 2023-08-24 ENCOUNTER — Encounter: Payer: Self-pay | Admitting: Emergency Medicine

## 2023-08-24 DIAGNOSIS — S161XXA Strain of muscle, fascia and tendon at neck level, initial encounter: Secondary | ICD-10-CM | POA: Diagnosis not present

## 2023-08-24 MED ORDER — BACLOFEN 10 MG PO TABS: 10.0000 mg | ORAL_TABLET | Freq: Three times a day (TID) | ORAL | 0 refills | Status: AC
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
## 2023-08-25 ENCOUNTER — Encounter: Payer: Self-pay | Admitting: Dietician

## 2023-08-25 ENCOUNTER — Encounter: Admitting: Student

## 2023-09-14 ENCOUNTER — Other Ambulatory Visit: Payer: Self-pay

## 2023-09-14 ENCOUNTER — Ambulatory Visit
Admission: RE | Admit: 2023-09-14 | Discharge: 2023-09-14 | Disposition: A | Discharge status: Home / Self Care | Source: Ambulatory Visit | Attending: Physician Assistant | Admitting: Physician Assistant

## 2023-09-14 VITALS — BP 170/110 | HR 90 | Temp 97.4°F | Resp 17 | Ht 66.0 in | Wt 160.0 lb

## 2023-09-14 DIAGNOSIS — L989 Disorder of the skin and subcutaneous tissue, unspecified: Secondary | ICD-10-CM | POA: Insufficient documentation

## 2023-09-14 DIAGNOSIS — J302 Other seasonal allergic rhinitis: Secondary | ICD-10-CM | POA: Diagnosis not present

## 2023-09-14 DIAGNOSIS — Z113 Encounter for screening for infections with a predominantly sexual mode of transmission: Secondary | ICD-10-CM | POA: Diagnosis not present

## 2023-09-14 NOTE — Discharge Instructions (Addendum)
 I also recommend adding an antihistamine to your daily regimen This includes medications like Claritin, Allegra, Zyrtec- the generics of these work very well and are usually less expensive I recommend using Flonase nasal spray - 2 puffs twice per day to help with your nasal congestion The antihistamines and Flonase can take a few weeks to provide significant relief from allergy symptoms but should start to provide some benefit soon.  You were seen today for concerns for STD exposure. We have completed a cytology swab to check for gonorrhea, chlamydia, and trichomonas. We have also drawn blood work to check for HIV and syphilis. We will keep you updated on these results once they are available and if any medications are indicated by this test results they will be sent in to the pharmacy on file or you will receive a call to set up an appointment for an injection here at urgent care. Please refrain from sexual activity until your test results are negative or until you have completed an appropriate medication regimen.  It is recommended that you use a condom or another barrier method to help prevent STD transmission going forward.  Please make sure that you are communicating your test results to your partners especially if there are any positive so that they can get requisite testing for themselves.  For your fingers I recommend using an occlusive ointment such as Aquaphor or Vaseline to help moisturize the area.  Make sure that you are keeping the area clean and dry with a gentle cleanser and warm water .  I do not recommend manipulating the area or trying to open the skin for drainage as this could introduce bacteria into the deeper tissues.  If at any point you notice more severe swelling, develop pain or stiffness, itching, inability to move your thumbs please follow-up with your primary care provider or go to the emergency room as these could be signs of a worsening condition.

## 2023-09-14 NOTE — ED Provider Notes (Signed)
 [facility]    CSN: [id number] Arrival date & time: 09/14/23  1206      History   Chief Complaint Chief Complaint  Patient presents with   Insect Bite    I think I got bit by something on my thumbs. Also would like to do my yearly std & disease screenings - Entered by patient    HPI Derry Jamil Ojelade [place] is a 28 y.o. male.   HPI  He reports concerns for skin lesions on both thumbs Is not sure if these are insect bites but denies itching, pain or drainage, bleeding He reports he has used a topical cream but is not sure what it was - thinks it may have been an acne medication   He also is requesting STD screening He denies current symptoms at this time and is just wanting testing for rule out    He reports having a scratchy throat and mild cough that is slightly productive He does not feel sick  He denies fever, chills, congestion, postnasal drainage, SOB, wheezing, chest tightness, chest pain    Past Medical History:  Diagnosis Date   Benign essential HTN 05/26/2023   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   Diabetes (HCC)    Diabetic gastroparesis (HCC) 05/26/2023   DKA (diabetic ketoacidoses)    DKA (diabetic ketoacidosis) (HCC) 05/05/2020   DKA, type 1 (HCC)    DM type 1 (diabetes mellitus, type 1) (HCC) 05/26/2023   had minimed pump in 2010 so was probably diagnosed at or before age 69   Insulin  pump in place    Leukocytosis 05/26/2023   Chronic to at least 2019     Patient Active Problem List   Diagnosis Date Noted   Acute kidney injury (AKI) with acute tubular necrosis (ATN) (HCC) 08/10/2023   Diabetic gastroparesis (HCC) 05/26/2023   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 No pertinent  surgical history.     Home Medications    Prior to Admission medications   Medication Sig Start Date End Date Taking? Authorizing Provider  amLODipine  (NORVASC ) 5 MG tablet Take 2 tablets (10 mg total) by mouth daily. 05/27/23   Lonita Roach, MD  baclofen  (LIORESAL ) 10 MG tablet Take 1 tablet (10 mg total) by mouth 3 (three) times daily. 08/24/23   Starlene Eaton, FNP  chlorthalidone  (HYGROTON ) 25 MG tablet Take 1 tablet (25 mg total) by mouth daily. Patient not taking: Reported on 08/10/2023 05/27/23   Lonita Roach, MD  Continuous Glucose Sensor (DEXCOM G7 SENSOR) MISC Use 1 each every 10 (ten) days for 90 days 06/20/23 09/18/23  [provider]  FLUoxetine  (PROZAC ) 10 MG capsule Take 1 capsule (10 mg total) by mouth daily. Patient taking differently: Take 10 mg by mouth daily as needed (Anxiety). 03/30/23 09/26/23  Nooruddin, Saad, MD  insulin  aspart (NOVOLOG ) 100 UNIT/ML injection Please insert vial into insulin  pump 03/11/23   Nooruddin, Saad, MD  Insulin  Pen Needle (PEN NEEDLES) 32G X 4 MM MISC Use as directed 03/11/23   Nooruddin, Saad, MD  lisinopril  (ZESTRIL ) 40 MG tablet Take 1 tablet (40 mg total) by mouth daily. 05/30/23   Malen Scudder, DO  ondansetron  (ZOFRAN ) 4 MG tablet Take 1 tablet (4 mg total) by mouth every  8 (eight) hours as needed for up to 20 doses for nausea or vomiting. 08/20/22   Osborn Blaze, MD  ondansetron  (ZOFRAN -ODT) 4 MG disintegrating tablet Take 4 mg by mouth every 8 (eight) hours as needed for nausea or vomiting.    [provider]  pantoprazole  (PROTONIX ) 40 MG tablet Take 40 mg by mouth daily. 05/08/23   [provider]  potassium chloride  SA (KLOR-CON  M) 20 MEQ tablet Take 1 tablet (20 mEq total) by mouth 2 (two) times daily. Patient not taking: Reported on 08/10/2023 05/31/23   Malen Scudder, DO    Family History Family History  Problem Relation Age of Onset   Healthy Mother    Diabetes Father     Social  History Social History   Tobacco Use   Smoking status: Never   Smokeless tobacco: Never  Vaping Use   Vaping status: Never Used  Substance Use Topics   Alcohol use: Yes   Drug use: Yes    Frequency: 3.0 times per week    Types: Marijuana     Allergies   Patient has no known allergies.   Review of Systems Review of Systems  Constitutional:  Negative for chills, fatigue and fever.  HENT:  Positive for rhinorrhea. Negative for congestion and postnasal drip.   Respiratory:  Positive for cough. Negative for chest tightness, shortness of breath and wheezing.   Cardiovascular:  Negative for chest pain.  Skin:        Nodules on both thumbs   Neurological:  Negative for headaches.     Physical Exam Triage Vital Signs ED Triage Vitals  Encounter Vitals Group     BP 09/14/23 1235 (!) 170/110     Systolic BP Percentile --      Diastolic BP Percentile --      Pulse Rate 09/14/23 1235 90     Resp 09/14/23 1235 17     Temp 09/14/23 1235 (!) 97.4 F (36.3 C)     Temp Source 09/14/23 1235 Oral     SpO2 09/14/23 1235 98 %     Weight 09/14/23 1238 160 lb (72.6 kg)     Height 09/14/23 1238 5\' 6"  (1.676 m)     Head Circumference --      Peak Flow --      Pain Score 09/14/23 1238 0     Pain Loc --      Pain Education --      Exclude from Growth Chart --    No data found.  Updated Vital Signs BP (!) 170/110 (BP Location: Right Arm) Comment: BP medicine not taken today.  Pulse 90   Temp (!) 97.4 F (36.3 C) (Oral) Comment: pt drinking soda in lobby.  Resp 17   Ht 5\' 6"  (1.676 m)   Wt 160 lb (72.6 kg)   SpO2 98%   BMI 25.82 kg/m   Visual Acuity Right Eye Distance:   Left Eye Distance:   Bilateral Distance:    Right Eye Near:   Left Eye Near:    Bilateral Near:     Physical Exam Vitals reviewed.  Constitutional:      General: He is awake. He is not in acute distress.    Appearance: Normal appearance. He is well-developed and well-groomed. He is not  ill-appearing or toxic-appearing.  HENT:     Head: Normocephalic and atraumatic.  Pulmonary:     Effort: Pulmonary effort is normal.  Musculoskeletal:     Cervical back: Normal  range of motion and neck supple.  Skin:    General: Skin is warm and dry.     Comments: Patient has papular lesions along the nailbed of the right thumb which are partially scaling.  No signs of erythema, swelling, purulent drainage or streaking.  Thumb ROM is intact. Patient has single skin colored nodule along the side of the left thumb.  No erythema, swelling, purulent drainage, bleeding.  Thumb ROM is intact and symmetrical with the right.  Neurological:     General: No focal deficit present.     Mental Status: He is alert and oriented to person, place, and time.     GCS: GCS eye subscore is 4. GCS verbal subscore is 5. GCS motor subscore is 6.     Cranial Nerves: No cranial nerve deficit, dysarthria or facial asymmetry.  Psychiatric:        Attention and Perception: Attention and perception normal.        Mood and Affect: Mood and affect normal.        Speech: Speech normal.        Behavior: Behavior normal. Behavior is cooperative.        Thought Content: Thought content normal.        Judgment: Judgment normal.      UC Treatments / Results  Labs (all labs ordered are listed, but only abnormal results are displayed) Labs Reviewed  RPR  HIV ANTIBODY (ROUTINE TESTING W REFLEX)  CYTOLOGY, (ORAL, ANAL, URETHRAL) ANCILLARY ONLY    EKG   Radiology No results found.  Procedures Procedures (including critical care time)  Medications Ordered in UC Medications - No data to display  Initial Impression / Assessment and Plan / UC Course  I have reviewed the triage vital signs and the nursing notes.  Pertinent labs & imaging results that were available during my care of the patient were reviewed by me and considered in my medical decision making (see chart for details).      Final Clinical  Impressions(s) / UC Diagnoses   Final diagnoses:  Screening examination for STD (sexually transmitted disease)  Acute seasonal allergic rhinitis  Bumps on skin   Patient presents today with concerns for an acute cough that is mildly productive along with mild runny nose.  He reports that he does not feel ill or sick denies recent sick contacts.  Overall symptoms appear consistent with likely allergic rhinitis and seasonal allergies.  Recommend starting second-generation antihistamine and Flonase to assist with symptoms.  Symptoms are not responding to the use medications recommend follow-up with PCP.  Patient also requests STD screening today.  Will get cytology for gonorrhea, chlamydia, trichomonas.  Will also get blood work for syphilis and HIV.  Recommend refraining from sexual activity until test results are back and are negative or until he completes an appropriate medication regimen.  Reviewed importance of using a barrier method or condoms to help prevent STD transmission and discussion with partners regarding results especially if there are positives.  Patient also has concerns for bumps along both arms.  Bumps appear overall benign and may be secondary to insect bite or potential eczema.  Second-generation antihistamine should be beneficial to assist with any itching that should develop.  Reviewed with patient that he should keep area clean and dry and avoid manipulating or traumatizing the area as this could lead to bacterial infection.  Patient voiced agreement understanding with recommendations.  Follow-up with PCP as needed.    Discharge Instructions  I also recommend adding an antihistamine to your daily regimen This includes medications like Claritin, Allegra, Zyrtec- the generics of these work very well and are usually less expensive I recommend using Flonase nasal spray - 2 puffs twice per day to help with your nasal congestion The antihistamines and Flonase can take a few weeks  to provide significant relief from allergy symptoms but should start to provide some benefit soon.  You were seen today for concerns for STD exposure. We have completed a cytology swab to check for gonorrhea, chlamydia, and trichomonas. We have also drawn blood work to check for HIV and syphilis. We will keep you updated on these results once they are available and if any medications are indicated by this test results they will be sent in to the pharmacy on file or you will receive a call to set up an appointment for an injection here at urgent care. Please refrain from sexual activity until your test results are negative or until you have completed an appropriate medication regimen.  It is recommended that you use a condom or another barrier method to help prevent STD transmission going forward.  Please make sure that you are communicating your test results to your partners especially if there are any positive so that they can get requisite testing for themselves.  For your fingers I recommend using an occlusive ointment such as Aquaphor or Vaseline to help moisturize the area.  Make sure that you are keeping the area clean and dry with a gentle cleanser and warm water .  I do not recommend manipulating the area or trying to open the skin for drainage as this could introduce bacteria into the deeper tissues.  If at any point you notice more severe swelling, develop pain or stiffness, itching, inability to move your thumbs please follow-up with your primary care provider or go to the emergency room as these could be signs of a worsening condition.      ED Prescriptions   None    PDMP not reviewed this encounter.   Jonanthan Bolender, Pearla Bottom, PA-C 09/14/23 1404

## 2023-09-14 NOTE — ED Triage Notes (Signed)
 Pt presents with complaints of a "bump" on bilateral thumbs that he has noticed for several weeks. Pt states he believes it is insect bites. Denies pain at the site.   Pt would also like to be tested for STD's. Requesting swab and blood work today. Denies symptoms at this time.   Pt mentions a cough as well. This has been going on for 1.5 weeks. OTC Nyquil taken with no improvement.

## 2023-09-15 LAB — HIV ANTIBODY (ROUTINE TESTING W REFLEX): HIV Screen 4th Generation wRfx: NONREACTIVE

## 2023-09-15 LAB — RPR: RPR Ser Ql: NONREACTIVE

## 2023-09-16 LAB — CYTOLOGY, (ORAL, ANAL, URETHRAL) ANCILLARY ONLY
Chlamydia: NEGATIVE
Comment: NEGATIVE
Comment: NEGATIVE
Comment: NORMAL
Neisseria Gonorrhea: NEGATIVE
Trichomonas: POSITIVE — AB

## 2023-09-19 ENCOUNTER — Telehealth (HOSPITAL_COMMUNITY): Payer: Self-pay

## 2023-09-19 MED ORDER — METRONIDAZOLE 500 MG PO TABS: 2000.0000 mg | ORAL_TABLET | Freq: Once | ORAL | 0 refills | Status: AC

## 2023-09-19 NOTE — Telephone Encounter (Signed)
 Per protocol, pt requires tx with metronidazole. Reviewed with patient, verified pharmacy, prescription sent.

## 2023-11-29 ENCOUNTER — Other Ambulatory Visit: Payer: Self-pay

## 2023-11-29 ENCOUNTER — Ambulatory Visit
Admission: EM | Admit: 2023-11-29 | Discharge: 2023-11-29 | Disposition: A | Discharge status: Home / Self Care | Attending: Family Medicine | Admitting: Family Medicine

## 2023-11-29 DIAGNOSIS — Z113 Encounter for screening for infections with a predominantly sexual mode of transmission: Secondary | ICD-10-CM | POA: Insufficient documentation

## 2023-11-29 DIAGNOSIS — I10 Essential (primary) hypertension: Secondary | ICD-10-CM | POA: Insufficient documentation

## 2023-11-29 NOTE — ED Provider Notes (Signed)
 [facility]    CSN: [id number] Arrival date & time: 11/29/23  1904      History   Chief Complaint No chief complaint on file.   HPI Mason Mclaughlin is a 28 y.o. male presents for STD screening.  He currently denies any symptoms including penile discharge, testicular pain or swelling, fevers nausea vomiting or flank pain.  No dysuria.  No known STD exposure.  Patient blood pressure was noted to be elevated on intake.  He states he forgot his blood pressure medications for the past couple days as he was out of town for his birthday.  He denies any chest pain, headache, dizziness, blurry vision, shortness of breath.  No other concerns at this time.  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   Diabetes (HCC)    Diabetic gastroparesis (HCC) 05/26/2023   DKA (diabetic ketoacidoses)    DKA (diabetic ketoacidosis) (HCC) 05/05/2020   DKA, type 1 (HCC)    DM type 1 (diabetes mellitus, type 1) (HCC) 05/26/2023   had minimed pump in 2010 so was probably diagnosed at or before age 21   Insulin  pump in place    Leukocytosis 05/26/2023   Chronic to at least 2019     Patient Active Problem List   Diagnosis Date Noted   Acute kidney injury (AKI) with acute tubular necrosis (ATN) (HCC) 08/10/2023   Diabetic gastroparesis (HCC) 05/26/2023   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 No pertinent surgical history.     Home Medications    Prior to Admission medications   Medication Sig Start Date End Date Taking? Authorizing Provider  amLODipine  (NORVASC ) 5 MG tablet Take 2 tablets (10 mg total) by mouth daily. 05/27/23   Jadine Toribio SQUIBB, MD   baclofen  (LIORESAL ) 10 MG tablet Take 1 tablet (10 mg total) by mouth 3 (three) times daily. 08/24/23   Enedelia Dorna HERO, FNP  chlorthalidone  (HYGROTON ) 25 MG tablet Take 1 tablet (25 mg total) by mouth daily. Patient not taking: Reported on 08/10/2023 05/27/23   Jadine Toribio SQUIBB, MD  FLUoxetine  (PROZAC ) 10 MG capsule Take 1 capsule (10 mg total) by mouth daily. Patient taking differently: Take 10 mg by mouth daily as needed (Anxiety). 03/30/23 09/26/23  Nooruddin, Saad, MD  insulin  aspart (NOVOLOG ) 100 UNIT/ML injection Please insert vial into insulin  pump 03/11/23   Nooruddin, Saad, MD  Insulin  Pen Needle (PEN NEEDLES) 32G X 4 MM MISC Use as directed 03/11/23   Nooruddin, Saad, MD  lisinopril  (ZESTRIL ) 40 MG tablet Take 1 tablet (40 mg total) by mouth daily. 05/30/23   Addie Perkins, DO  ondansetron  (ZOFRAN ) 4 MG tablet Take 1 tablet (4 mg total) by mouth every 8 (eight) hours as needed for up to 20 doses for nausea or vomiting. 08/20/22   Trixie Nilda HERO, MD  ondansetron  (ZOFRAN -ODT) 4 MG disintegrating tablet Take 4 mg by mouth every 8 (eight) hours as needed for nausea or vomiting.    [provider]  pantoprazole  (PROTONIX ) 40 MG tablet Take 40 mg by mouth daily. 05/08/23   [provider]  potassium chloride  SA (KLOR-CON  M) 20 MEQ tablet Take 1 tablet (20 mEq total) by  mouth 2 (two) times daily. Patient not taking: Reported on 08/10/2023 05/31/23   Addie Perkins, DO    Family History Family History  Problem Relation Age of Onset   Healthy Mother    Diabetes Father     Social History Social History   Tobacco Use   Smoking status: Never   Smokeless tobacco: Never  Vaping Use   Vaping status: Never Used  Substance Use Topics   Alcohol use: Yes    Comment: occ   Drug use: Yes    Frequency: 3.0 times per week    Types: Marijuana     Allergies   Patient has no known allergies.   Review of Systems Review of Systems  Genitourinary:        STD screening      Physical Exam Triage Vital Signs ED Triage Vitals  Encounter Vitals Group     BP 11/29/23 1910 (!) 188/125     Girls Systolic BP Percentile --      Girls Diastolic BP Percentile --      Boys Systolic BP Percentile --      Boys Diastolic BP Percentile --      Pulse Rate 11/29/23 1910 86     Resp 11/29/23 1910 16     Temp 11/29/23 1910 98 F (36.7 C)     Temp Source 11/29/23 1910 Oral     SpO2 11/29/23 1910 96 %     Weight --      Height --      Head Circumference --      Peak Flow --      Pain Score 11/29/23 1908 0     Pain Loc --      Pain Education --      Exclude from Growth Chart --    No data found.  Updated Vital Signs BP (!) 176/111   Pulse 86   Temp 98 F (36.7 C) (Oral)   Resp 16   SpO2 96%   Visual Acuity Right Eye Distance:   Left Eye Distance:   Bilateral Distance:    Right Eye Near:   Left Eye Near:    Bilateral Near:     Physical Exam Vitals and nursing note reviewed.  Constitutional:      Appearance: Normal appearance.  HENT:     Head: Normocephalic and atraumatic.  Eyes:     Pupils: Pupils are equal, round, and reactive to light.  Cardiovascular:     Rate and Rhythm: Normal rate.  Pulmonary:     Effort: Pulmonary effort is normal.  Skin:    General: Skin is warm and dry.  Neurological:     General: No focal deficit present.     Mental Status: He is alert and oriented to person, place, and time.  Psychiatric:        Mood and Affect: Mood normal.        Behavior: Behavior normal.      UC Treatments / Results  Labs (all labs ordered are listed, but only abnormal results are displayed) Labs Reviewed  RPR  HIV ANTIBODY (ROUTINE TESTING W REFLEX)  CYTOLOGY, (ORAL, ANAL, URETHRAL) ANCILLARY ONLY    EKG   Radiology No results found.  Procedures Procedures (including critical care time)  Medications Ordered in UC Medications - No data to display  Initial Impression / Assessment and Plan / UC Course  I have reviewed  the triage vital signs and the nursing notes.  Pertinent labs & imaging  results that were available during my care of the patient were reviewed by me and considered in my medical decision making (see chart for details).     STD testing is ordered we will contact for any positive results.  Blood pressure on recheck today improved slightly.  Patient has not taken his medications in a couple of days.  He currently denies any symptoms.  Instructed him to take his blood pressure medication once he gets home and keep a log of his BP and take it to his PCP.  ER precautions reviewed. Final Clinical Impressions(s) / UC Diagnoses   Final diagnoses:  Screening examination for STD (sexually transmitted disease)  Hypertension, unspecified type     Discharge Instructions      The clinic will contact you with results of the testing done today if positive.  Please take your blood pressure medication when you get home and keep a log of your blood pressures and take this to your primary care.  Please go to the ER if you develop any symptoms such as headache, chest pain, shortness of breath, dizziness, visual changes.     ED Prescriptions   None    PDMP not reviewed this encounter.   Loreda Myla SAUNDERS, NP 11/29/23 [phone]

## 2023-11-29 NOTE — Discharge Instructions (Addendum)
 The clinic will contact you with results of the testing done today if positive.  Please take your blood pressure medication when you get home and keep a log of your blood pressures and take this to your primary care.  Please go to the ER if you develop any symptoms such as headache, chest pain, shortness of breath, dizziness, visual changes.

## 2023-11-29 NOTE — ED Triage Notes (Signed)
 Pt wants STI testing. Pt denies sx.

## 2023-11-30 LAB — CYTOLOGY, (ORAL, ANAL, URETHRAL) ANCILLARY ONLY
Chlamydia: NEGATIVE
Comment: NEGATIVE
Comment: NEGATIVE
Comment: NORMAL
Neisseria Gonorrhea: NEGATIVE
Trichomonas: POSITIVE — AB

## 2023-12-01 ENCOUNTER — Ambulatory Visit (HOSPITAL_COMMUNITY): Payer: Self-pay

## 2023-12-01 LAB — HIV ANTIBODY (ROUTINE TESTING W REFLEX): HIV Screen 4th Generation wRfx: NONREACTIVE

## 2023-12-01 LAB — RPR: RPR Ser Ql: NONREACTIVE

## 2023-12-01 MED ORDER — METRONIDAZOLE 500 MG PO TABS: 2000.0000 mg | ORAL_TABLET | Freq: Once | ORAL | 0 refills | Status: AC

## 2023-12-28 ENCOUNTER — Encounter: Payer: Self-pay | Admitting: Dietician
